# Patient Record
Sex: Female | Born: 1935 | Race: White | Hispanic: No | Smoking: Never smoker
Health system: Southern US, Community
[De-identification: ages and names within clinical notes are randomized; demographics above are authoritative.]

## PROBLEM LIST (undated history)

## (undated) DIAGNOSIS — K219 Gastro-esophageal reflux disease without esophagitis: Secondary | ICD-10-CM

## (undated) DIAGNOSIS — I509 Heart failure, unspecified: Secondary | ICD-10-CM

## (undated) DIAGNOSIS — I493 Ventricular premature depolarization: Secondary | ICD-10-CM

## (undated) DIAGNOSIS — S2239XA Fracture of one rib, unspecified side, initial encounter for closed fracture: Secondary | ICD-10-CM

## (undated) DIAGNOSIS — F0390 Unspecified dementia without behavioral disturbance: Secondary | ICD-10-CM

## (undated) DIAGNOSIS — E785 Hyperlipidemia, unspecified: Secondary | ICD-10-CM

## (undated) DIAGNOSIS — E039 Hypothyroidism, unspecified: Secondary | ICD-10-CM

## (undated) DIAGNOSIS — D126 Benign neoplasm of colon, unspecified: Secondary | ICD-10-CM

## (undated) DIAGNOSIS — K222 Esophageal obstruction: Secondary | ICD-10-CM

## (undated) DIAGNOSIS — R51 Headache: Secondary | ICD-10-CM

## (undated) DIAGNOSIS — IMO0002 Reserved for concepts with insufficient information to code with codable children: Secondary | ICD-10-CM

## (undated) DIAGNOSIS — S2249XA Multiple fractures of ribs, unspecified side, initial encounter for closed fracture: Secondary | ICD-10-CM

## (undated) DIAGNOSIS — I471 Supraventricular tachycardia, unspecified: Secondary | ICD-10-CM

## (undated) DIAGNOSIS — I10 Essential (primary) hypertension: Secondary | ICD-10-CM

## (undated) HISTORY — DX: Essential (primary) hypertension: I10

## (undated) HISTORY — PX: BREAST ENHANCEMENT SURGERY: SHX7

## (undated) HISTORY — DX: Reserved for concepts with insufficient information to code with codable children: IMO0002

## (undated) HISTORY — PX: COLONOSCOPY: SHX174

## (undated) HISTORY — DX: Benign neoplasm of colon, unspecified: D12.6

## (undated) HISTORY — DX: Gastro-esophageal reflux disease without esophagitis: K21.9

## (undated) HISTORY — DX: Fracture of one rib, unspecified side, initial encounter for closed fracture: S22.39XA

## (undated) HISTORY — DX: Ventricular premature depolarization: I49.3

## (undated) HISTORY — PX: ABDOMINAL HYSTERECTOMY: SHX81

## (undated) HISTORY — PX: CARDIAC ELECTROPHYSIOLOGY MAPPING AND ABLATION: SHX1292

## (undated) HISTORY — DX: Hyperlipidemia, unspecified: E78.5

## (undated) HISTORY — PX: CATARACT EXTRACTION: SUR2

## (undated) HISTORY — DX: Multiple fractures of ribs, unspecified side, initial encounter for closed fracture: S22.49XA

## (undated) HISTORY — PX: OTHER SURGICAL HISTORY: SHX169

## (undated) HISTORY — DX: Esophageal obstruction: K22.2

---

## 1999-07-29 ENCOUNTER — Encounter: Payer: Self-pay | Admitting: Internal Medicine

## 1999-07-29 ENCOUNTER — Ambulatory Visit (HOSPITAL_COMMUNITY): Admission: RE | Admit: 1999-07-29 | Discharge: 1999-07-29 | Payer: Self-pay | Admitting: Internal Medicine

## 2000-09-27 ENCOUNTER — Encounter: Payer: Self-pay | Admitting: Emergency Medicine

## 2000-09-27 ENCOUNTER — Emergency Department (HOSPITAL_COMMUNITY): Admission: EM | Admit: 2000-09-27 | Discharge: 2000-09-27 | Payer: Self-pay | Admitting: Emergency Medicine

## 2000-12-09 ENCOUNTER — Other Ambulatory Visit: Admission: RE | Admit: 2000-12-09 | Discharge: 2000-12-09 | Payer: Self-pay | Admitting: Obstetrics and Gynecology

## 2001-06-01 DIAGNOSIS — D126 Benign neoplasm of colon, unspecified: Secondary | ICD-10-CM

## 2001-06-01 HISTORY — DX: Benign neoplasm of colon, unspecified: D12.6

## 2002-07-24 ENCOUNTER — Encounter: Payer: Self-pay | Admitting: Family Medicine

## 2002-07-24 ENCOUNTER — Encounter: Admission: RE | Admit: 2002-07-24 | Discharge: 2002-07-24 | Payer: Self-pay | Admitting: Family Medicine

## 2003-02-19 ENCOUNTER — Encounter: Admission: RE | Admit: 2003-02-19 | Discharge: 2003-02-19 | Payer: Self-pay | Admitting: Internal Medicine

## 2003-02-19 ENCOUNTER — Encounter: Payer: Self-pay | Admitting: Internal Medicine

## 2003-08-24 ENCOUNTER — Emergency Department (HOSPITAL_COMMUNITY): Admission: EM | Admit: 2003-08-24 | Discharge: 2003-08-25 | Payer: Self-pay | Admitting: Emergency Medicine

## 2003-11-21 ENCOUNTER — Inpatient Hospital Stay (HOSPITAL_COMMUNITY): Admission: EM | Admit: 2003-11-21 | Discharge: 2003-11-22 | Payer: Self-pay | Admitting: Emergency Medicine

## 2003-11-22 ENCOUNTER — Encounter: Payer: Self-pay | Admitting: Internal Medicine

## 2004-04-22 ENCOUNTER — Ambulatory Visit: Payer: Self-pay | Admitting: Gastroenterology

## 2004-06-01 DIAGNOSIS — I493 Ventricular premature depolarization: Secondary | ICD-10-CM

## 2004-06-01 HISTORY — DX: Ventricular premature depolarization: I49.3

## 2004-08-04 ENCOUNTER — Ambulatory Visit: Payer: Self-pay | Admitting: Internal Medicine

## 2004-08-05 ENCOUNTER — Ambulatory Visit: Payer: Self-pay | Admitting: Internal Medicine

## 2004-08-12 ENCOUNTER — Ambulatory Visit: Payer: Self-pay

## 2004-09-04 ENCOUNTER — Ambulatory Visit: Payer: Self-pay | Admitting: Internal Medicine

## 2004-09-23 ENCOUNTER — Encounter: Admission: RE | Admit: 2004-09-23 | Discharge: 2004-09-23 | Payer: Self-pay | Admitting: Internal Medicine

## 2004-11-05 ENCOUNTER — Ambulatory Visit: Payer: Self-pay | Admitting: Internal Medicine

## 2004-11-11 ENCOUNTER — Ambulatory Visit (HOSPITAL_COMMUNITY): Admission: RE | Admit: 2004-11-11 | Discharge: 2004-11-11 | Payer: Self-pay | Admitting: Orthopedic Surgery

## 2004-11-18 ENCOUNTER — Encounter: Admission: RE | Admit: 2004-11-18 | Discharge: 2004-12-17 | Payer: Self-pay | Admitting: Internal Medicine

## 2004-12-10 ENCOUNTER — Ambulatory Visit: Payer: Self-pay | Admitting: Internal Medicine

## 2005-01-05 ENCOUNTER — Encounter: Admission: RE | Admit: 2005-01-05 | Discharge: 2005-02-03 | Payer: Self-pay | Admitting: Orthopedic Surgery

## 2005-03-23 ENCOUNTER — Ambulatory Visit: Payer: Self-pay | Admitting: Internal Medicine

## 2005-03-26 ENCOUNTER — Ambulatory Visit: Payer: Self-pay | Admitting: Internal Medicine

## 2005-04-20 ENCOUNTER — Ambulatory Visit: Payer: Self-pay | Admitting: Internal Medicine

## 2005-06-01 DIAGNOSIS — K222 Esophageal obstruction: Secondary | ICD-10-CM

## 2005-06-01 HISTORY — DX: Esophageal obstruction: K22.2

## 2005-06-23 ENCOUNTER — Ambulatory Visit: Payer: Self-pay | Admitting: Gastroenterology

## 2005-07-08 ENCOUNTER — Encounter: Payer: Self-pay | Admitting: Internal Medicine

## 2005-07-08 ENCOUNTER — Ambulatory Visit: Payer: Self-pay | Admitting: Gastroenterology

## 2005-08-20 ENCOUNTER — Ambulatory Visit: Payer: Self-pay | Admitting: Internal Medicine

## 2005-08-27 ENCOUNTER — Ambulatory Visit: Payer: Self-pay | Admitting: Internal Medicine

## 2005-09-01 ENCOUNTER — Ambulatory Visit: Payer: Self-pay | Admitting: Gastroenterology

## 2005-11-06 ENCOUNTER — Ambulatory Visit: Payer: Self-pay | Admitting: Internal Medicine

## 2005-11-17 ENCOUNTER — Ambulatory Visit: Payer: Self-pay | Admitting: Gastroenterology

## 2005-11-25 ENCOUNTER — Ambulatory Visit: Payer: Self-pay | Admitting: Gastroenterology

## 2005-11-27 ENCOUNTER — Encounter: Payer: Self-pay | Admitting: Internal Medicine

## 2005-11-27 ENCOUNTER — Ambulatory Visit: Payer: Self-pay | Admitting: Gastroenterology

## 2005-12-16 ENCOUNTER — Ambulatory Visit: Payer: Self-pay | Admitting: Internal Medicine

## 2005-12-18 ENCOUNTER — Ambulatory Visit: Payer: Self-pay | Admitting: Gastroenterology

## 2005-12-21 ENCOUNTER — Ambulatory Visit: Payer: Self-pay

## 2006-01-12 ENCOUNTER — Ambulatory Visit: Payer: Self-pay | Admitting: Internal Medicine

## 2006-07-20 ENCOUNTER — Ambulatory Visit: Payer: Self-pay | Admitting: Internal Medicine

## 2006-08-18 ENCOUNTER — Ambulatory Visit: Payer: Self-pay

## 2007-02-24 ENCOUNTER — Encounter: Payer: Self-pay | Admitting: Internal Medicine

## 2007-04-05 ENCOUNTER — Telehealth (INDEPENDENT_AMBULATORY_CARE_PROVIDER_SITE_OTHER): Payer: Self-pay | Admitting: *Deleted

## 2007-07-18 ENCOUNTER — Ambulatory Visit: Payer: Self-pay | Admitting: Internal Medicine

## 2007-07-18 DIAGNOSIS — K209 Esophagitis, unspecified without bleeding: Secondary | ICD-10-CM | POA: Insufficient documentation

## 2007-07-18 DIAGNOSIS — M5020 Other cervical disc displacement, unspecified cervical region: Secondary | ICD-10-CM | POA: Insufficient documentation

## 2007-07-18 DIAGNOSIS — E039 Hypothyroidism, unspecified: Secondary | ICD-10-CM

## 2007-07-18 DIAGNOSIS — E782 Mixed hyperlipidemia: Secondary | ICD-10-CM

## 2007-07-19 ENCOUNTER — Encounter (INDEPENDENT_AMBULATORY_CARE_PROVIDER_SITE_OTHER): Payer: Self-pay | Admitting: *Deleted

## 2007-07-26 ENCOUNTER — Encounter (INDEPENDENT_AMBULATORY_CARE_PROVIDER_SITE_OTHER): Payer: Self-pay | Admitting: *Deleted

## 2007-07-28 ENCOUNTER — Encounter (INDEPENDENT_AMBULATORY_CARE_PROVIDER_SITE_OTHER): Payer: Self-pay | Admitting: *Deleted

## 2007-07-28 ENCOUNTER — Ambulatory Visit: Payer: Self-pay | Admitting: Internal Medicine

## 2007-09-30 ENCOUNTER — Telehealth (INDEPENDENT_AMBULATORY_CARE_PROVIDER_SITE_OTHER): Payer: Self-pay | Admitting: *Deleted

## 2007-09-30 ENCOUNTER — Observation Stay (HOSPITAL_COMMUNITY): Admission: EM | Admit: 2007-09-30 | Discharge: 2007-10-03 | Payer: Self-pay | Admitting: Emergency Medicine

## 2007-09-30 ENCOUNTER — Ambulatory Visit: Payer: Self-pay | Admitting: Cardiology

## 2007-10-19 ENCOUNTER — Ambulatory Visit: Payer: Self-pay | Admitting: Cardiovascular Disease

## 2007-10-31 ENCOUNTER — Ambulatory Visit: Payer: Self-pay | Admitting: Internal Medicine

## 2007-10-31 DIAGNOSIS — IMO0002 Reserved for concepts with insufficient information to code with codable children: Secondary | ICD-10-CM | POA: Insufficient documentation

## 2007-10-31 DIAGNOSIS — M79609 Pain in unspecified limb: Secondary | ICD-10-CM

## 2008-01-12 ENCOUNTER — Ambulatory Visit: Payer: Self-pay | Admitting: Internal Medicine

## 2008-01-12 ENCOUNTER — Encounter (INDEPENDENT_AMBULATORY_CARE_PROVIDER_SITE_OTHER): Payer: Self-pay | Admitting: *Deleted

## 2008-01-12 DIAGNOSIS — R42 Dizziness and giddiness: Secondary | ICD-10-CM

## 2008-01-12 DIAGNOSIS — I10 Essential (primary) hypertension: Secondary | ICD-10-CM

## 2008-01-12 DIAGNOSIS — I4949 Other premature depolarization: Secondary | ICD-10-CM

## 2008-01-13 ENCOUNTER — Encounter (INDEPENDENT_AMBULATORY_CARE_PROVIDER_SITE_OTHER): Payer: Self-pay | Admitting: *Deleted

## 2008-02-01 ENCOUNTER — Ambulatory Visit: Payer: Self-pay | Admitting: Cardiology

## 2008-03-05 ENCOUNTER — Ambulatory Visit: Payer: Self-pay | Admitting: Internal Medicine

## 2008-03-05 DIAGNOSIS — M545 Low back pain: Secondary | ICD-10-CM

## 2008-03-05 LAB — CONVERTED CEMR LAB
Bilirubin Urine: NEGATIVE
Glucose, Urine, Semiquant: NEGATIVE
pH: 6.5

## 2008-03-09 ENCOUNTER — Telehealth (INDEPENDENT_AMBULATORY_CARE_PROVIDER_SITE_OTHER): Payer: Self-pay | Admitting: *Deleted

## 2008-03-12 ENCOUNTER — Ambulatory Visit: Payer: Self-pay | Admitting: Internal Medicine

## 2008-03-19 ENCOUNTER — Ambulatory Visit: Payer: Self-pay | Admitting: Internal Medicine

## 2008-03-19 LAB — CONVERTED CEMR LAB
Glucose, Urine, Semiquant: NEGATIVE
Specific Gravity, Urine: <1.005
pH: 7

## 2008-03-20 ENCOUNTER — Encounter: Payer: Self-pay | Admitting: Internal Medicine

## 2008-03-21 ENCOUNTER — Telehealth (INDEPENDENT_AMBULATORY_CARE_PROVIDER_SITE_OTHER): Payer: Self-pay | Admitting: *Deleted

## 2008-03-23 ENCOUNTER — Telehealth (INDEPENDENT_AMBULATORY_CARE_PROVIDER_SITE_OTHER): Payer: Self-pay | Admitting: *Deleted

## 2008-04-16 ENCOUNTER — Encounter: Payer: Self-pay | Admitting: Internal Medicine

## 2008-04-18 ENCOUNTER — Encounter: Payer: Self-pay | Admitting: Internal Medicine

## 2008-06-26 ENCOUNTER — Encounter: Payer: Self-pay | Admitting: Internal Medicine

## 2008-06-27 ENCOUNTER — Telehealth (INDEPENDENT_AMBULATORY_CARE_PROVIDER_SITE_OTHER): Payer: Self-pay | Admitting: *Deleted

## 2008-07-18 ENCOUNTER — Ambulatory Visit: Payer: Self-pay | Admitting: Internal Medicine

## 2008-09-18 ENCOUNTER — Encounter: Payer: Self-pay | Admitting: Internal Medicine

## 2008-09-19 ENCOUNTER — Telehealth: Payer: Self-pay | Admitting: Internal Medicine

## 2008-09-20 ENCOUNTER — Ambulatory Visit: Payer: Self-pay | Admitting: Internal Medicine

## 2008-09-20 DIAGNOSIS — D487 Neoplasm of uncertain behavior of other specified sites: Secondary | ICD-10-CM

## 2008-09-21 ENCOUNTER — Encounter (INDEPENDENT_AMBULATORY_CARE_PROVIDER_SITE_OTHER): Payer: Self-pay | Admitting: *Deleted

## 2008-09-21 LAB — CONVERTED CEMR LAB
Basophils Absolute: 0 10*3/uL (ref 0.0–0.1)
Lymphocytes Relative: 36.8 % (ref 12.0–46.0)
Lymphs Abs: 2.8 10*3/uL (ref 0.7–4.0)
Monocytes Relative: 7.7 % (ref 3.0–12.0)
Platelets: 279 10*3/uL (ref 150.0–400.0)
Prothrombin Time: 10.6 s — ABNORMAL LOW (ref 10.9–13.3)
RDW: 12.5 % (ref 11.5–14.6)
aPTT: 27.1 s (ref 21.7–28.8)

## 2008-10-01 ENCOUNTER — Telehealth (INDEPENDENT_AMBULATORY_CARE_PROVIDER_SITE_OTHER): Payer: Self-pay | Admitting: *Deleted

## 2008-10-05 ENCOUNTER — Encounter: Payer: Self-pay | Admitting: Internal Medicine

## 2008-10-09 ENCOUNTER — Telehealth: Payer: Self-pay | Admitting: Internal Medicine

## 2008-10-22 ENCOUNTER — Ambulatory Visit: Payer: Self-pay | Admitting: Internal Medicine

## 2008-10-22 DIAGNOSIS — R002 Palpitations: Secondary | ICD-10-CM | POA: Insufficient documentation

## 2008-10-30 ENCOUNTER — Ambulatory Visit: Payer: Self-pay | Admitting: Internal Medicine

## 2008-11-04 LAB — CONVERTED CEMR LAB
ALT: 20 units/L (ref 0–35)
AST: 25 units/L (ref 0–37)
Alkaline Phosphatase: 56 units/L (ref 39–117)
Bilirubin, Direct: 0 mg/dL (ref 0.0–0.3)
Calcium: 9.6 mg/dL (ref 8.4–10.5)
GFR calc non Af Amer: 73.01 mL/min (ref >60)
Glucose, Bld: 86 mg/dL (ref 70–99)
HDL: 57.8 mg/dL (ref >39.00)
Sodium: 144 meq/L (ref 135–145)
TSH: 5.89 microintl units/mL — ABNORMAL HIGH (ref 0.35–5.50)
Total Bilirubin: 0.9 mg/dL (ref 0.3–1.2)

## 2008-11-05 ENCOUNTER — Encounter (INDEPENDENT_AMBULATORY_CARE_PROVIDER_SITE_OTHER): Payer: Self-pay | Admitting: *Deleted

## 2008-11-05 ENCOUNTER — Telehealth (INDEPENDENT_AMBULATORY_CARE_PROVIDER_SITE_OTHER): Payer: Self-pay | Admitting: *Deleted

## 2008-12-24 ENCOUNTER — Telehealth (INDEPENDENT_AMBULATORY_CARE_PROVIDER_SITE_OTHER): Payer: Self-pay | Admitting: *Deleted

## 2009-01-09 ENCOUNTER — Encounter: Payer: Self-pay | Admitting: Internal Medicine

## 2009-01-18 ENCOUNTER — Telehealth (INDEPENDENT_AMBULATORY_CARE_PROVIDER_SITE_OTHER): Payer: Self-pay | Admitting: *Deleted

## 2009-02-05 ENCOUNTER — Ambulatory Visit: Payer: Self-pay | Admitting: Internal Medicine

## 2009-02-06 ENCOUNTER — Telehealth (INDEPENDENT_AMBULATORY_CARE_PROVIDER_SITE_OTHER): Payer: Self-pay | Admitting: *Deleted

## 2009-02-07 ENCOUNTER — Encounter (INDEPENDENT_AMBULATORY_CARE_PROVIDER_SITE_OTHER): Payer: Self-pay | Admitting: *Deleted

## 2009-02-08 ENCOUNTER — Ambulatory Visit: Payer: Self-pay | Admitting: Internal Medicine

## 2009-02-08 DIAGNOSIS — M949 Disorder of cartilage, unspecified: Secondary | ICD-10-CM

## 2009-02-08 DIAGNOSIS — M899 Disorder of bone, unspecified: Secondary | ICD-10-CM | POA: Insufficient documentation

## 2009-02-20 ENCOUNTER — Ambulatory Visit: Payer: Self-pay | Admitting: Internal Medicine

## 2009-06-05 ENCOUNTER — Encounter: Payer: Self-pay | Admitting: Internal Medicine

## 2009-07-09 ENCOUNTER — Ambulatory Visit: Payer: Self-pay | Admitting: Internal Medicine

## 2009-07-09 DIAGNOSIS — M5137 Other intervertebral disc degeneration, lumbosacral region: Secondary | ICD-10-CM

## 2009-07-09 DIAGNOSIS — R040 Epistaxis: Secondary | ICD-10-CM

## 2009-07-09 LAB — CONVERTED CEMR LAB
Bilirubin Urine: NEGATIVE
Blood in Urine, dipstick: NEGATIVE
Ketones, urine, test strip: NEGATIVE
Protein, U semiquant: NEGATIVE
Urobilinogen, UA: 0.2

## 2009-10-14 ENCOUNTER — Telehealth: Payer: Self-pay | Admitting: Gastroenterology

## 2009-11-07 ENCOUNTER — Telehealth (INDEPENDENT_AMBULATORY_CARE_PROVIDER_SITE_OTHER): Payer: Self-pay | Admitting: *Deleted

## 2009-11-08 ENCOUNTER — Ambulatory Visit: Payer: Self-pay | Admitting: Internal Medicine

## 2010-01-20 ENCOUNTER — Encounter: Payer: Self-pay | Admitting: Internal Medicine

## 2010-02-05 ENCOUNTER — Ambulatory Visit: Payer: Self-pay | Admitting: Internal Medicine

## 2010-06-10 ENCOUNTER — Telehealth (INDEPENDENT_AMBULATORY_CARE_PROVIDER_SITE_OTHER): Payer: Self-pay | Admitting: *Deleted

## 2010-06-29 LAB — CONVERTED CEMR LAB
AST: 22 units/L (ref 0–37)
BUN: 14 mg/dL (ref 6–23)
Basophils Relative: 0.8 % (ref 0.0–1.0)
Bilirubin, Direct: 0.1 mg/dL (ref 0.0–0.3)
Calcium: 10 mg/dL (ref 8.4–10.5)
GFR calc non Af Amer: 73 mL/min
Glucose, Bld: 93 mg/dL (ref 70–99)
HDL: 62.9 mg/dL (ref >39.0)
LDL Cholesterol: 109 mg/dL — ABNORMAL HIGH (ref 0–99)
Lymphocytes Relative: 39.6 % (ref 12.0–46.0)
Monocytes Relative: 7.3 % (ref 3.0–11.0)
Neutro Abs: 2.8 10*3/uL (ref 1.4–7.7)
Platelets: 267 10*3/uL (ref 150–400)
Potassium: 4.2 meq/L (ref 3.5–5.1)
RDW: 12 % (ref 11.5–14.6)
Sodium: 141 meq/L (ref 135–145)
TSH: 2.21 microintl units/mL (ref 0.35–5.50)
Total Bilirubin: 0.8 mg/dL (ref 0.3–1.2)
Triglycerides: 47 mg/dL (ref 0–149)
VLDL: 9 mg/dL (ref 0–40)
Vit D, 1,25-Dihydroxy: 53 (ref 30–89)

## 2010-07-03 NOTE — Progress Notes (Signed)
Summary: Refill Request  Phone Note Refill Request Message from:  Patient on November 07, 2009 12:42 PM  Refills Requested: Medication #1:  SYNTHROID 75 MCG  TABS TAKE ONE HALF TABLET DAILY EXCEPT 1 pill q Weds   Dosage confirmed as above?Dosage Confirmed   Brand Name Necessary? No   Supply Requested: 3 months  Medication #2:  HYDROCHLOROTHIAZIDE 25 MG TABS Take 1/2 tab once daily   Dosage confirmed as above?Dosage Confirmed   Supply Requested: 3 months [facility]  Next Appointment Scheduled: none Initial call taken by: Harold Barban,  November 07, 2009 12:43 PM  Follow-up for Phone Call        Spoke with patient and informed her that labs is due to refill synthroid. Patient scheduled appointment for tomorrow and said hold off on refilling both meds until lab results back. Follow-up by: Shonna Chock,  November 07, 2009 2:34 PM    Prescriptions: HYDROCHLOROTHIAZIDE 25 MG TABS (HYDROCHLOROTHIAZIDE) Take 1/2 tab once daily  #100 Tablet x 3   Entered by:   Shonna Chock   Authorized by:   Marga Melnick MD   Signed by:   Shonna Chock on 11/11/2009   Method used:   Faxed to ...       [facility] (mail-order)             , [place]         Ph: [phone]       Fax: [phone]   RxID:   [id number] SYNTHROID 75 MCG  TABS (LEVOTHYROXINE SODIUM) TAKE ONE HALF TABLET DAILY EXCEPT 1 pill q Weds  #60 x 3   Entered by:   Shonna Chock   Authorized by:   Marga Melnick MD   Signed by:   Shonna Chock on 11/11/2009   Method used:   Faxed to ...       [facility] (mail-order)             , [place]         Ph: [phone]       Fax: [phone]   RxID:   [id number]

## 2010-07-03 NOTE — Letter (Signed)
Summary: Jacqualyn Posey DC MPH  Jacqualyn Posey DC MPH   Imported By: Lanelle Bal 06/28/2009 12:25:54  _____________________________________________________________________  External Attachment:    Type:   Image     Comment:   External Document

## 2010-07-03 NOTE — Assessment & Plan Note (Signed)
Summary: DON'T KNOW//PH   Vital Signs:  Patient profile:   77 year old female Weight:      137.8 pounds Temp:     98.5 degrees F oral Pulse rate:   60 / minute Resp:     15 per minute BP sitting:   134 / 88  (left arm) Cuff size:   regular  Vitals Entered By: Shonna Chock (July 09, 2009 11:38 AM) CC: 1.) Sore in nose  2.) Right side back paoin-lower Comments REVIEWED MED LIST, PATIENT AGREED DOSE AND INSTRUCTION CORRECT    Primary Care Provider:  Marga Melnick MD  CC:  1.) Sore in nose  2.) Right side back paoin-lower.  History of Present Illness: She has had chronic LBP since [place]  [place] train [place]. Dr Melvyn Neth , her Chiropractor questioned L5- S1 traumatic disc protrusion . His 06/05/2009 note reviewed. He suggested MRI , but she now remembers she had MRI by Dr Lestine Box post fall in early November. She is unsure as to structure imaged.Subsequently she saw Dr Ranell Patrick. Rx: Physical Therapy. Chiropractry has helped back pain.              Also she has had a sore in nose intermittently for years , especially in Winter . Epistaxis fro R 07/07/2009. Rx: [place] topically  Allergies: 1)  ! Prevacid  Review of Systems General:  Denies chills, fever, sweats, and weight loss. ENT:  Complains of sinus pressure; denies nasal congestion; No frontal headache, facial pain or purulence, but clear rhinitis. Resp:  Denies cough and sputum productive. GI:  No stool incontinence. GU:  Denies incontinence; Urgency . Derm:  Denies lesion(s) and rash. Neuro:  Complains of numbness; denies brief paralysis, tingling, and weakness; Numbness in feet occasionally. No radicular pain in LE.  Physical Exam  General:  Appears younger than age,well-nourished,in no acute distress; alert,appropriate and cooperative throughout examination Ears:  External ear exam shows no significant lesions or deformities.  Otoscopic examination reveals clear canals, tympanic membranes are intact bilaterally without  bulging, retraction, inflammation or discharge. Hearing is grossly normal bilaterally. Nose:  External nasal examination shows no deformity or inflammation. R nasal mucosa dry & erythematous  without lesions or exudates. Mouth:  Oral mucosa and oropharynx without lesions or exudates.  Teeth in good repair. Benign osteomata of mandible Lungs:  Normal respiratory effort, chest expands symmetrically. Lungs are clear to auscultation, no crackles or wheezes. Msk:  Sat up w/o help Extremities:  Negative SLR bilaterally Neurologic:  alert & oriented X3, strength normal in all extremities, heel & tiptoe gait normal, and DTRs symmetrical and normal.   Cervical Nodes:  No lymphadenopathy noted Axillary Nodes:  No palpable lymphadenopathy Psych:  memory intact for recent and remote, normally interactive, and good eye contact.     Impression & Recommendations:  Problem # 1:  DEGENERATIVE DISC DISEASE, LUMBAR SPINE (ICD-722.52)  Clinically no Neuro deficit; ? MRI done 04/2008 by Dr Lestine Box  Orders: Prescription Created Electronically [phone])  Problem # 2:  EPISTAXIS (ICD-784.7) due to excessive drying  Complete Medication List: 1)  Synthroid 75 Mcg Tabs (Levothyroxine sodium) .... Take one half tablet daily except 1 pill q weds 2)  Lipitor 20 Mg Tabs (Atorvastatin calcium) .Marland Kitchen.. 1 by mouth every other day 3)  Adult Aspirin Low Strength 81 Mg Tbdp (Aspirin) .... 1/2 tablet  by mouth once daily 4)  Glucosamine 1500 Complex Caps (Glucosamine-chondroit-vit c-mn) .Marland Kitchen.. 1 by mouth once daily 5)  Calcium 600  Mg Tabs (calcium Carbonate)  .... Take one tablet by mouth twice daily. 6)  Multi-day Vitamins Tabs (Multiple vitamin) .Marland Kitchen.. 1 by mouth once daily 7)  Vitamin D3 2000 Unit Caps (Cholecalciferol) .Marland Kitchen.. 1 by mouth once daily 8)  Vitamin C 1000 Mg Tabs (Ascorbic acid) .Marland Kitchen.. 1 by mouth once daily 9)  Tylenol Ex St Arthritis Pain 500 Mg Tabs (Acetaminophen) .... As needed 10)  Spironolactone 25 Mg Tabs  (Spironolactone) .Marland Kitchen.. 1 once daily if bp averages > 135/85 11)  Aciphex 20 Mg Tbec (Rabeprazole sodium) .Marland Kitchen.. 1 tablet by mouth twice a day 12)  Hydrochlorothiazide 25 Mg Tabs (Hydrochlorothiazide) .... Take 1/2 tab once daily 13)  Vitamin E 400 Unit Caps (Vitamin e) .Marland Kitchen.. 1 by mouth once daily 14)  Ocuvite Preservision Tabs (Multiple vitamins-minerals) .... Take one tablet by mouth once daily. 15)  Fish Oil Oil (Fish oil) .Marland Kitchen.. 1 tablet 3-7 times per week 16)  Coq10 100 Mg Caps (Coenzyme q10) .Marland Kitchen.. 1 by mouth once daily 17)  Tramadol Hcl 50 Mg Tabs (Tramadol hcl) .Marland Kitchen.. 1 q 6 hrs as needed joint pain  Other Orders: UA Dipstick w/o Micro (manual) ([id number])  Patient Instructions: 1)  Eucerin two times a day to nasal tissue, do not use at bedtime . Sign Release for Dr Ashok Norris records Prescriptions: TRAMADOL HCL 50 MG TABS (TRAMADOL HCL) 1 q 6 hrs as needed joint pain  #30 x 2   Entered and Authorized by:   Marga Melnick MD   Signed by:   Marga Melnick MD on 07/09/2009   Method used:   Faxed to ...       [facility] [phone]* (retail)       [address] [place], [place]  [address]       Ph: [phone]       Fax: [phone]   RxID:   [id number]   Laboratory Results   Urine Tests    Routine Urinalysis   Color: yellow Appearance: Clear Glucose: negative   (Normal Range: Negative) Bilirubin: negative   (Normal Range: Negative) Ketone: negative   (Normal Range: Negative) Spec. Gravity: <1.005   (Normal Range: 1.003-1.035) Blood: negative   (Normal Range: Negative) pH: 7.5   (Normal Range: 5.0-8.0) Protein: negative   (Normal Range: Negative) Urobilinogen: 0.2   (Normal Range: 0-1) Nitrite: negative   (Normal Range: Negative) Leukocyte Esterace: negative   (Normal Range: Negative)

## 2010-07-03 NOTE — Assessment & Plan Note (Signed)
Summary: yearly/sl   Primary [place]ne Banta:  Shelly Melnick MD  CC:  yearly.  Marland Kitchen  History of Present Illness: Shelly Ross returns today for followup of palpitations.  She is an elderly woman with a h/o palpitations of unclear etiology.  When I saw her last I had asked her to come in and get an ECG if she had more symptoms.  She had been taking fosamax and once she stopped this medication, the palpitations have resolved. Her main complaint today is regarding arthritis in her knees.  No c/p or sob. No syncope.  Current Medications (verified): 1)  Synthroid 75 Mcg  Tabs (Levothyroxine Sodium) .... Take One Half Tablet Daily Except 1 Pill Q Weds 2)  Lipitor 20 Mg  Tabs (Atorvastatin Calcium) .Marland Kitchen.. 1 By Mouth Every Other Day 3)  Adult Aspirin Low Strength 81 Mg  Tbdp (Aspirin) .... 1/2 Tablet  By Mouth Once Daily 4)  Glucosamine 1500 Complex   Caps (Glucosamine-Chondroit-Vit C-Mn) .Marland Kitchen.. 1 By Mouth Once Daily 5)  Calcium 600 Mg  Tabs (Calcium Carbonate) .... Take One Tablet By Mouth Twice Daily. 6)  Multi-Day Vitamins   Tabs (Multiple Vitamin) .Marland Kitchen.. 1 By Mouth Once Daily 7)  Vitamin D3 2000 Unit Caps (Cholecalciferol) .Marland Kitchen.. 1 By Mouth Two Times A Day 8)  Vitamin C 1000 Mg  Tabs (Ascorbic Acid) .Marland Kitchen.. 1 By Mouth Once Daily 9)  Tylenol Ex St Arthritis Pain 500 Mg  Tabs (Acetaminophen) .... As Needed 10)  Spironolactone 25 Mg Tabs (Spironolactone) .Marland Kitchen.. 1 Once Daily If Bp Averages > 135/85 11)  Aciphex 20 Mg Tbec (Rabeprazole Sodium) .Marland Kitchen.. 1 Tablet By Mouth Twice A Day 12)  Hydrochlorothiazide 25 Mg Tabs (Hydrochlorothiazide) .... Take 1/2 Tab Once Daily 13)  Vitamin E 400 Unit Caps (Vitamin E) .Marland Kitchen.. 1 By Mouth Once Daily 14)  Ocuvite Preservision  Tabs (Multiple Vitamins-Minerals) .... Take One Tablet By Mouth Once Daily. 15)  Fish Oil   Oil (Fish Oil) .Marland Kitchen.. 1 Tablet 3-7 Times Per Week 16)  Coq10 100 Mg Caps (Coenzyme Q10) .Marland Kitchen.. 1 By Mouth Once Daily 17)  Tramadol Hcl 50 Mg Tabs (Tramadol Hcl) .Marland Kitchen.. 1 Q 6 Hrs As  Needed Joint Pain  Allergies (verified): 1)  ! Prevacid  Past History:  Past Medical History: Last updated: 10/19/2008 UNSPECIFIED PRE-OPERATIVE EXAMINATION (ICD-V72.84) NEOPLASM UNCERTAIN BEHAVIOR OTHER SPEC SITES (ICD-238.8) LOW BACK PAIN, CHRONIC (ICD-724.2) PREMATURE VENTRICULAR CONTRACTIONS (ICD-427.69) DIZZINESS (ICD-780.4) HYPERTENSION (ICD-401.9) UNSPECIFIED NEURALGIA NEURITIS AND RADICULITIS (ICD-729.2) LEG PAIN, LEFT (ICD-729.5) DEGENERATIVE DISC DISEASE, CERVICAL SPINE, W/RADICULOPATHY (ICD-722.0) GERD (ICD-530.1) HYPERLIPIDEMIA (ICD-272.2) HYPOTHYROIDISM (ICD-244.9) OSTEOPOROSIS (ICD-733.00)  Past Surgical History: Last updated: 09/20/2008 Hysterectomy, endometriosis with Oophorectomy, ? bilat mammoplasty gravid 2 para 2 tacking procedure colonoscopy with polyp 12/2001 ablation  for PAF 09/2003 colonoscopy proctitis, tics 03/2004 endo stricture, hiatal hernia 07/2005 colonoscopy tics 11/27/2005 Cath  5/09 : no critical lesions   Review of Systems  The patient denies chest pain, syncope, dyspnea on exertion, and peripheral edema.    Vital Signs:  Patient profile:   77 year old female Height:      65 inches Weight:      132 pounds BMI:     22.05 Pulse rate:   63 / minute Pulse rhythm:   regular BP sitting:   116 / 72  (right arm) Cuff size:   regular  Vitals Entered By: Judithe Modest CMA (February 05, 2010 11:40 AM)  Physical Exam  General:  Appears younger than age,well-nourished,in no acute distress; alert,appropriate and cooperative  throughout examination Head:  normocephalic and atraumatic Eyes:  No corneal or conjunctival inflammation noted. EOMI. Perrla.  FO Vision grossly normal. Mouth:  Oral mucosa and oropharynx without lesions or exudates.  Teeth in good repair. Benign osteomata of mandible Neck:  Neck supple, no JVD. No masses, thyromegaly or abnormal cervical nodes. Lungs:  Normal respiratory effort, chest expands symmetrically.  Lungs are clear to auscultation, no crackles or wheezes. Heart:  RRR with normal S1 and S2.  PMI is not enlarged or laterally displaced. Abdomen:  Bowel sounds positive; abdomen soft and non-tender without masses, organomegaly, or hernias noted. No hepatosplenomegaly. Msk:  Sat up w/o help Pulses:  pulses normal in all 4 extremities Extremities:  Negative SLR bilaterally Neurologic:  alert & oriented X3, strength normal in all extremities, heel & tiptoe gait normal, and DTRs symmetrical and normal.     EKG  Procedure date:  02/05/2010  Findings:      Normal sinus rhythm with rate of:  63.  Impression & Recommendations:  Problem # 1:  PALPITATIONS, RECURRENT (ICD-785.1) Her symptoms have resolved and high have recommended a period of watchful waiting.  She will call us if they return. Her updated medication list for this problem includes:    Adult Aspirin Low Strength 81 Mg Tbdp (Aspirin) .Marland Kitchen... 1/2 tablet  by mouth once daily  Orders: EKG w/ Interpretation (93000)  Problem # 2:  HYPERTENSION (ICD-401.9) Her blood pressure is well controlled.  She will continue meds as below and I will see her back on an as needed basis. Her updated medication list for this problem includes:    Adult Aspirin Low Strength 81 Mg Tbdp (Aspirin) .Marland Kitchen... 1/2 tablet  by mouth once daily    Spironolactone 25 Mg Tabs (Spironolactone) .Marland Kitchen... 1 once daily if bp averages > 135/85    Hydrochlorothiazide 25 Mg Tabs (Hydrochlorothiazide) .Marland Kitchen... Take 1/2 tab once daily  Patient Instructions: 1)  Your physician recommends that you schedule a follow-up appointment in: AS NEEDED 2)  Your physician recommends that you continue on your current medications as directed. Please refer to the Current Medication list given to you today.

## 2010-07-03 NOTE — Progress Notes (Signed)
Summary: Schedule NP3 to discuss colonosocpy  Phone Note Outgoing Call Call back at [facility] Phone [phone]   Call placed by: Harlow Mares CMA [facility]),  Oct 14, 2009 4:46 PM Call placed to: Patient Summary of Call: spoke to the patient she needs an office visit to discuss colonoscopy, she will call back after she thinks about having a colonoscopy. Initial call taken by: Harlow Mares CMA [facility]),  Oct 14, 2009 4:48 PM

## 2010-07-03 NOTE — Progress Notes (Signed)
Summary: Refill Request  Phone Note Refill Request Call back at [phone] Message from:  Patient on June 10, 2010 10:50 AM  Refills Requested: Medication #1:  LIPITOR 20 MG  TABS 1 by mouth every other day   Dosage confirmed as above?Dosage Confirmed   Supply Requested: 3 months   Last Refilled: 02/06/2009 [facility]  Next Appointment Scheduled: 1.23.12 Initial call taken by: Harold Barban,  June 10, 2010 10:51 AM  Follow-up for Phone Call        Can you please contact patient for fasting labs(here or [facility]), last chlosterol check was 2010, unless patient had elsewhere. Then I can give patient 30day supply at local pharmacy ( med is generic) until labs received.  Lipid/Hep 272.4/995.20 Follow-up by: Shonna Chock CMA,  June 10, 2010 11:30 AM  Additional Follow-up for Phone Call Additional follow up Details #1::        Patient is coming in 1.23.12 for a fasting appt.  Additional Follow-up by: Harold Barban,  June 10, 2010 11:39 AM    New/Updated Medications: LIPITOR 20 MG  TABS (ATORVASTATIN CALCIUM) 1 by mouth every other day Prescriptions: LIPITOR 20 MG  TABS (ATORVASTATIN CALCIUM) 1 by mouth every other day  #30 x 0   Entered by:   Shonna Chock CMA   Authorized by:   Marga Melnick MD   Signed by:   Shonna Chock CMA on 06/10/2010   Method used:   Electronically to        [id number]* (retail)       [address] [place], [place]  [address]       Ph: [phone]       Fax: [phone]   RxID:   [id number]

## 2010-07-23 ENCOUNTER — Encounter: Payer: Self-pay | Admitting: Internal Medicine

## 2010-07-23 ENCOUNTER — Encounter (INDEPENDENT_AMBULATORY_CARE_PROVIDER_SITE_OTHER): Payer: Medicare Other | Admitting: Internal Medicine

## 2010-07-23 DIAGNOSIS — E782 Mixed hyperlipidemia: Secondary | ICD-10-CM

## 2010-07-23 DIAGNOSIS — E039 Hypothyroidism, unspecified: Secondary | ICD-10-CM

## 2010-07-23 DIAGNOSIS — Z0000 Encounter for general adult medical examination without abnormal findings: Secondary | ICD-10-CM

## 2010-07-23 DIAGNOSIS — I10 Essential (primary) hypertension: Secondary | ICD-10-CM

## 2010-07-23 DIAGNOSIS — F411 Generalized anxiety disorder: Secondary | ICD-10-CM | POA: Insufficient documentation

## 2010-07-23 DIAGNOSIS — K209 Esophagitis, unspecified without bleeding: Secondary | ICD-10-CM

## 2010-07-24 ENCOUNTER — Other Ambulatory Visit: Payer: Medicare Other

## 2010-07-24 ENCOUNTER — Encounter (INDEPENDENT_AMBULATORY_CARE_PROVIDER_SITE_OTHER): Payer: Self-pay | Admitting: *Deleted

## 2010-07-24 ENCOUNTER — Other Ambulatory Visit: Payer: Self-pay | Admitting: Internal Medicine

## 2010-07-24 DIAGNOSIS — M949 Disorder of cartilage, unspecified: Secondary | ICD-10-CM

## 2010-07-24 DIAGNOSIS — T887XXA Unspecified adverse effect of drug or medicament, initial encounter: Secondary | ICD-10-CM

## 2010-07-24 DIAGNOSIS — E785 Hyperlipidemia, unspecified: Secondary | ICD-10-CM

## 2010-07-24 DIAGNOSIS — E039 Hypothyroidism, unspecified: Secondary | ICD-10-CM

## 2010-07-24 DIAGNOSIS — K219 Gastro-esophageal reflux disease without esophagitis: Secondary | ICD-10-CM

## 2010-07-24 DIAGNOSIS — M899 Disorder of bone, unspecified: Secondary | ICD-10-CM

## 2010-07-24 LAB — BASIC METABOLIC PANEL
BUN: 20 mg/dL (ref 6–23)
Calcium: 10 mg/dL (ref 8.4–10.5)
Creatinine, Ser: 0.8 mg/dL (ref 0.4–1.2)
GFR: 70.66 mL/min (ref >60.00)
Glucose, Bld: 77 mg/dL (ref 70–99)

## 2010-07-24 LAB — CBC WITH DIFFERENTIAL/PLATELET
Eosinophils Relative: 7.5 % — ABNORMAL HIGH (ref 0.0–5.0)
HCT: 42.3 % (ref 36.0–46.0)
Hemoglobin: 14.4 g/dL (ref 12.0–15.0)
Lymphs Abs: 2.5 10*3/uL (ref 0.7–4.0)
MCV: 93.1 fl (ref 78.0–100.0)
Monocytes Absolute: 0.5 10*3/uL (ref 0.1–1.0)
Neutro Abs: 2.5 10*3/uL (ref 1.4–7.7)
Platelets: 243 10*3/uL (ref 150.0–400.0)
RDW: 13.2 % (ref 11.5–14.6)

## 2010-07-24 LAB — LIPID PANEL
Cholesterol: 157 mg/dL (ref 0–200)
HDL: 58.9 mg/dL (ref >39.00)
VLDL: 5.8 mg/dL (ref 0.0–40.0)

## 2010-07-24 LAB — HEPATIC FUNCTION PANEL: Albumin: 3.7 g/dL (ref 3.5–5.2)

## 2010-07-29 NOTE — Assessment & Plan Note (Signed)
Summary: emp/lch   Vital Signs:  Patient profile:   77 year old female Height:      63.25 inches Weight:      134.6 pounds BMI:     23.74 Temp:     98.6 degrees F oral Pulse rate:   68 / minute Resp:     14 per minute BP sitting:   128 / 86  (left arm) Cuff size:   regular  Vitals Entered By: Shonna Chock CMA (July 23, 2010 11:02 AM) CC: 1.) CPX, patient forgot to fast  2.) Shakey and slight chest pain x 1 hour yesterday after eating   Vision Screening:Left eye with correction: 20 / 40 Right eye with correction: 20 / 40 Both eyes with correction: 20 / 40        Vision Entered By: Shonna Chock CMA (July 23, 2010 11:23 AM)   Primary Care Provider:  Marga Melnick MD  CC:  1.) CPX and patient forgot to fast  2.) Shakey and slight chest pain x 1 hour yesterday after eating .  History of Present Illness: Here for Medicare AWV: 1.Risk factors based on Past M, S, F history:see Diagnoses; chart updated. 2.Physical Activities: weights & stretching 3X/week for > 60 minutes 3.Depression/mood: some anxiety & depression  due to   idenity  thefts , husband's health ( S/ P AAA surgery), deaths in extended famiy  4.Hearing: significant loss bilaterally; reluctant to pursue hearing aids for cosmetic reasons 5.ADL's: no limitations 6.Fall Risk: intermittent balance concerns ( [facility] Chi @ [facility] recommended) 7.Home Safety: [facility] safety proofed 8.Height, weight, &visual acuity:see VS 9.Counseling: pre Nuptial agreement in place; POA & Living Will to be assessed 10.Labs ordered based on risk factors: see Orders 11.Referral Coordination: chronic  PNDrainage was evaluated by ENT w/o resolution 12.Care Plan: see Instructions 13.Cognitive Assessment: Oriented X 3; memory & recall good   ; "WORLD " spelled backwards; affect slightly anxious     Hyperlipidemia Follow-Up: She  reports muscle aches (especially in legs), but denies GI upset, abdominal pain, flushing, itching,  constipation,   & diarrhea.  The patient denies the following symptoms: chest pain/pressure, exercise intolerance, dypsnea, palpitations, syncope, and pedal edema.  Compliance with medications (by patient report) has been near 100%.  Dietary compliance has been good.  Adjunctive measures currently used by the patient include ASA & Co Q 10.      Hypertension Follow-Up:She  reports urinary frequency and  stress induced headaches, but denies lightheadedness.  Compliance with medications (by patient report) has been near 100%.  Adjunctive measures currently used by the patient include salt restriction.    Preventive Screening-Counseling & Management  Alcohol-Tobacco     Alcohol drinks/day: <1     Smoking Status: never  Caffeine-Diet-Exercise     Caffeine use/day: 2 cups coffee; occasional tea  Hep-HIV-STD-Contraception     Dental Visit-last 6 months yes     Sun Exposure-Excessive: no  Safety-Violence-Falls     Seat Belt Use: yes     Smoke Detectors: yes      Blood Transfusions:  no.        Travel History:  2002 [place].    Current Medications (verified): 1)  Synthroid 75 Mcg  Tabs (Levothyroxine Sodium) .... Take One Half Tablet Daily Except 1 Pill Q Weds 2)  Lipitor 20 Mg  Tabs (Atorvastatin Calcium) .Marland Kitchen.. 1 By Mouth Every Other Day 3)  Adult Aspirin Low Strength 81 Mg  Tbdp (Aspirin) .... 1/2 Tablet  By Mouth Once Daily 4)  Glucosamine 1500 Complex   Caps (Glucosamine-Chondroit-Vit C-Mn) .Marland Kitchen.. 1 By Mouth Once Daily 5)  Calcium 600 Mg  Tabs (Calcium Carbonate) .... Take One Tablet By Mouth Twice Daily. 6)  Multi-Day Vitamins   Tabs (Multiple Vitamin) .Marland Kitchen.. 1 By Mouth Once Daily 7)  Vitamin D3 2000 Unit Caps (Cholecalciferol) .Marland Kitchen.. 1 By Mouth Two Times A Day 8)  Vitamin C 1000 Mg  Tabs (Ascorbic Acid) .Marland Kitchen.. 1 By Mouth Once Daily 9)  Tylenol Ex St Arthritis Pain 500 Mg  Tabs (Acetaminophen) .... As Needed 10)  Aciphex 20 Mg Tbec (Rabeprazole Sodium) .Marland Kitchen.. 1 Tablet By Mouth Twice A Day 11)   Hydrochlorothiazide 25 Mg Tabs (Hydrochlorothiazide) .... Take 1/2 Tab Once Daily 12)  Vitamin E 400 Unit Caps (Vitamin E) .Marland Kitchen.. 1 By Mouth Once Daily 13)  Ocuvite Preservision  Tabs (Multiple Vitamins-Minerals) .... Take One Tablet By Mouth Once Daily. 14)  Fish Oil   Oil (Fish Oil) .Marland Kitchen.. 1 Tablet 3-7 Times Per Week 15)  Coq10 100 Mg Caps (Coenzyme Q10) .Marland Kitchen.. 1 By Mouth Once Daily 16)  Tramadol Hcl 50 Mg Tabs (Tramadol Hcl) .Marland Kitchen.. 1 Q 6 Hrs As Needed Joint Pain  Allergies: 1)  ! Prevacid  Past History:  Past Medical History: LOW BACK PAIN, CHRONIC (ICD-724.2) PREMATURE VENTRICULAR CONTRACTIONS (ICD-427.69) DIZZINESS (ICD-780.4) HYPERTENSION (ICD-401.9) UNSPECIFIED NEURALGIA NEURITIS AND RADICULITIS (ICD-729.2) DEGENERATIVE DISC DISEASE, CERVICAL SPINE, W/RADICULOPATHY (ICD-722.0) GERD (ICD-530.1) HYPERLIPIDEMIA (ICD-272.2) HYPOTHYROIDISM (ICD-244.9) OSTEOPOROSIS (ICD-733.00) Rib fractures , multiple, PMH of 2003  Past Surgical History: Hysterectomy with Oophorectomy, ? bilateral for Endometriosis mammoplasty gravid 2 para 2; bladder tacking procedure colonoscopy with polypectomy  12/2001 ablation  for PAF 09/2003 colonoscopy :proctitis, tics 03/2004 endoscopy: stricture, hiatal hernia 07/2005 colonoscopy: tics 11/27/2005 Cath  09/2007 : no critical lesions   Family History: brother:TB brother: DVT, PTE three brothers :CAD,MI , CABG aunt :intestinal cancer  mother :CVA father: CAD  Social History: Caffeine use/day:  2 cups coffee; occasional tea Dental Care w/in 6 mos.:  yes Sun Exposure-Excessive:  no Seat Belt Use:  yes Blood Transfusions:  no  Review of Systems General:  Complains of fatigue; denies weight loss. Eyes:  Complains of blurring; denies double vision and vision loss-both eyes. ENT:  Complains of hoarseness and nosebleeds. GI:  Denies bloody stools, dark tarry stools, and indigestion. GU:  Denies hematuria. Derm:  Denies changes in nail beds, dryness,  and hair loss. Neuro:  Denies numbness and tingling. Endo:  Denies cold intolerance and heat intolerance.  Physical Exam  General:  Appears younger than age,in no acute distress; alert,appropriate and cooperative throughout examination Head:  Normocephalic and atraumatic without obvious abnormalities.  Eyes:  No corneal or conjunctival inflammation noted. OD cataract > OS Ears:  External ear exam shows no significant lesions or deformities.  Otoscopic examination reveals clear canals, tympanic membranes are intact bilaterally without bulging, retraction, inflammation or discharge. Hearing is grossly decreased  bilaterally. Nose:  External nasal examination shows no deformity or inflammation. Nasal mucosa are  dry ( especialy R )without lesions or exudates. Mouth:  Oral mucosa and oropharynx without lesions or exudates.  Teeth in good repair. Neck:  No deformities, masses, or tenderness noted. Lungs:  Normal respiratory effort, chest expands symmetrically. Lungs are clear to auscultation, no crackles or wheezes. Heart:  Normal rate and regular rhythm. S1 and S2 normal without gallop, murmur, click, rub .S4 with slurring Abdomen:  Bowel sounds positive,abdomen soft and non-tender without masses, organomegaly or hernias  noted. Msk:  No deformity or scoliosis noted of thoracic or lumbar spine.   Pulses:  R and L carotid,radial,dorsalis pedis and posterior tibial pulses are full and equal bilaterally Extremities:  No clubbing, cyanosis, edema. Minor DIP changes Neurologic:  alert & oriented X3 and DTRs symmetrical and normal.   Skin:  Intact without suspicious lesions or rashes Cervical Nodes:  No lymphadenopathy noted Axillary Nodes:  No palpable lymphadenopathy Psych:  subdued  but  slightly anxious.     Impression & Recommendations:  Problem # 1:  PREVENTIVE HEALTH CARE (ICD-V70.0)  Orders: Medicare -1st Annual Wellness Visit [id number])  Problem # 2:  HYPERLIPIDEMIA (ICD-272.2)  Her  updated medication list for this problem includes:    Lipitor 20 Mg Tabs (Atorvastatin calcium) .Marland Kitchen... 1 by mouth every other day  Problem # 3:  HYPERTENSION (ICD-401.9)  The following medications were removed from the medication list:    Spironolactone 25 Mg Tabs (Spironolactone) .Marland Kitchen... 1 once daily if bp averages > 135/85 Her updated medication list for this problem includes:    Hydrochlorothiazide 25 Mg Tabs (Hydrochlorothiazide) .Marland Kitchen... Take 1/2 tab once daily  Orders: EKG w/ Interpretation (93000)  Problem # 4:  HYPOTHYROIDISM (ICD-244.9)  Her updated medication list for this problem includes:    Synthroid 75 Mcg Tabs (Levothyroxine sodium) .Marland Kitchen... Take one half tablet daily except 1 pill q weds  Problem # 5:  ANXIETY (ICD-300.00) Situational Her updated medication list for this problem includes:    Lorazepam 0.5 Mg Tabs (Lorazepam) .Marland Kitchen... 1 every 8 hrs as needed for anxiety  Complete Medication List: 1)  Synthroid 75 Mcg Tabs (Levothyroxine sodium) .... Take one half tablet daily except 1 pill q weds 2)  Lipitor 20 Mg Tabs (Atorvastatin calcium) .Marland Kitchen.. 1 by mouth every other day 3)  Adult Aspirin Low Strength 81 Mg Tbdp (Aspirin) .... 1/2 tablet  by mouth once daily 4)  Glucosamine 1500 Complex Caps (Glucosamine-chondroit-vit c-mn) .Marland Kitchen.. 1 by mouth once daily 5)  Calcium 600 Mg Tabs (calcium Carbonate)  .... Take one tablet by mouth twice daily. 6)  Multi-day Vitamins Tabs (Multiple vitamin) .Marland Kitchen.. 1 by mouth once daily 7)  Vitamin D3 2000 Unit Caps (Cholecalciferol) .Marland Kitchen.. 1 by mouth two times a day 8)  Vitamin C 1000 Mg Tabs (Ascorbic acid) .Marland Kitchen.. 1 by mouth once daily 9)  Tylenol Ex St Arthritis Pain 500 Mg Tabs (Acetaminophen) .... As needed 10)  Aciphex 20 Mg Tbec (Rabeprazole sodium) .Marland Kitchen.. 1 tablet by mouth twice a day 11)  Hydrochlorothiazide 25 Mg Tabs (Hydrochlorothiazide) .... Take 1/2 tab once daily 12)  Vitamin E 400 Unit Caps (Vitamin e) .Marland Kitchen.. 1 by mouth once daily 13)  Ocuvite  Preservision Tabs (Multiple vitamins-minerals) .... Take one tablet by mouth once daily. 14)  Fish Oil Oil (Fish oil) .Marland Kitchen.. 1 tablet 3-7 times per week 15)  Coq10 100 Mg Caps (Coenzyme q10) .Marland Kitchen.. 1 by mouth once daily 16)  Tramadol Hcl 50 Mg Tabs (Tramadol hcl) .Marland Kitchen.. 1 q 6 hrs as needed joint pain 17)  Lorazepam 0.5 Mg Tabs (Lorazepam) .Marland Kitchen.. 1 every 8 hrs as needed for anxiety  Patient Instructions: 1)  Please consider Audiology evaluation & Ophthalmologic follow up of cataracts.  Schedule fasting labs @ [facility]: vitamin D level; 2)  BMP ; 3)  Hepatic Panel; 4)  Lipid Panel; 5)  TSH ; 6)  CBC w/ Diff.  Prescriptions: LORAZEPAM 0.5 MG TABS (LORAZEPAM) 1 every 8 hrs as needed for anxiety  #30 x 1   Entered and Authorized by:   Marga Melnick MD   Signed by:   Marga Melnick MD on 07/23/2010   Method used:   Printed then faxed to ...       [facility] [phone]* (retail)       [address] [place], [place]  [address]       Ph: [phone]       Fax: [phone]   RxID:   [id number]    Orders Added: 1)  Est. Patient Level III [32440] 2)  EKG w/ Interpretation [93000] 3)  Medicare -1st Annual Wellness Visit [G0438]

## 2010-08-07 NOTE — Procedures (Signed)
Summary: EGD Report/[facility]  EGD Report/[facility]   Imported By: Maryln Gottron 07/29/2010 09:47:55  _____________________________________________________________________  External Attachment:    Type:   Image     Comment:   External Document

## 2010-08-07 NOTE — Procedures (Signed)
Summary: Colonoscopy Report/[facility]  Colonoscopy Report/[facility]   Imported By: Maryln Gottron 07/29/2010 09:46:04  _____________________________________________________________________  External Attachment:    Type:   Image     Comment:   External Document

## 2010-09-08 ENCOUNTER — Encounter: Payer: Self-pay | Admitting: Internal Medicine

## 2010-09-08 ENCOUNTER — Ambulatory Visit (INDEPENDENT_AMBULATORY_CARE_PROVIDER_SITE_OTHER): Payer: Medicare Other | Admitting: Internal Medicine

## 2010-09-08 DIAGNOSIS — M5137 Other intervertebral disc degeneration, lumbosacral region: Secondary | ICD-10-CM

## 2010-09-08 DIAGNOSIS — R110 Nausea: Secondary | ICD-10-CM

## 2010-09-08 DIAGNOSIS — M545 Low back pain, unspecified: Secondary | ICD-10-CM

## 2010-09-08 DIAGNOSIS — IMO0001 Reserved for inherently not codable concepts without codable children: Secondary | ICD-10-CM

## 2010-09-08 DIAGNOSIS — M51379 Other intervertebral disc degeneration, lumbosacral region without mention of lumbar back pain or lower extremity pain: Secondary | ICD-10-CM

## 2010-09-08 MED ORDER — TRAMADOL HCL 50 MG PO TABS: 50.0000 mg | ORAL_TABLET | Freq: Four times a day (QID) | ORAL | Status: AC | PRN

## 2010-09-08 MED ORDER — LORAZEPAM 0.5 MG PO TABS: 0.5000 mg | ORAL_TABLET | Freq: Three times a day (TID) | ORAL | Status: DC | PRN

## 2010-09-08 MED ORDER — ATORVASTATIN CALCIUM 20 MG PO TABS: 20.0000 mg | ORAL_TABLET | ORAL | Status: DC

## 2010-09-08 NOTE — Progress Notes (Signed)
  Subjective:    Patient ID: Shelly Ross, female    DOB: August 25, 1924, 77 y.o.   MRN: [id number]  HPI BACK PAIN  Location: LS spine on L Quality: sharp Onset: this am, but she has intermittent less severe pain for > 2  years Worse with: standing Better with: sitting Radiation: into L thigh to knee Trauma: no Best leaning forward: no  Red Flags Fecal/urinary incontinence: no  Numbness/Weakness: ? Numbness w/o weakness  Fever/chills/sweats: no  Night pain: no  Unexplained weight loss: no  No relief with bedrest: no, not tried  h/o cancer/immunosuppression: no  PMH of osteoporosis : yes, no long term steroids       Review of Systems Patient reports no  Diplopia or vision loss ; intermittent hoarseness from PNDrainage. No  swallowing issues.No gastrointestinal bleeding(melena, rectal bleeding), abdominal pain, significant heartburn,GU symptoms(dysuria, hematuria,pyuria), Gyn symptoms(abnormal  bleeding , pain),  syncope, focal weakness.No  skin/hair /nail changes,abnormal bruising or bleeding. Some  Anxiety due to her husband's health issues. Nausea w/o vomiting just today.     Objective:   Physical Exam  Gen.: Healthy and well-nourished in appearance. Alert, appropriate and cooperative throughout exam.seems slightly anxious. Back pain history vague @ times. Eyes: no icterus. Mouth: Oral mucosa and oropharynx reveal no lesions or exudates. Teeth in good repair. Neck: No deformities, masses, or tenderness noted. Range of motion normal..  Lungs: Normal respiratory effort; chest expands symmetrically. Lungs are clear to auscultation without rales, wheezes, or increased work of breathing. Heart: Normal rate and rhythm. Normal S1 and S2. No gallop, click, or rub. Grade 1/6 systolic  murmur. Abdomen: Bowel sounds normal; abdomen soft and nontender. No masses, organomegaly or hernias noted. Musculoskeletal/extremities: No deformity or scoliosis noted of  the thoracic or lumbar spine. No  clubbing, cyanosis, edema, or deformity noted. Range of motion normal .Tone & strength   good.Joints :minor OA of hands. . Vascular: Carotid, radial artery, dorsalis pedis and dorsalis posterior tibial pulses are full and equal. No bruits present. Neurologic: Alert and oriented x3. Deep tendon reflexes symmetrical and normal. Gait slow with decreased arm swing. Decreased facial animation.Neg SLR to 90 degrees Skin: Intact without suspicious lesions or rashes.  Lymph: No cervical, axillary  lymphadenopathy present.  Psych: Mood and affect are flat;  Clinically depression present .            Assessment & Plan:  #1 back pain with ? Radiculopathy   #2 nausea  #3 situational anxiety/ depression

## 2010-09-08 NOTE — Patient Instructions (Signed)
Use lorazepam every 8 hrs as mild sedative & muscle relaxant. See Dr Melvyn Neth, Chiropractor,  if no better.

## 2010-09-19 ENCOUNTER — Telehealth: Payer: Self-pay | Admitting: Internal Medicine

## 2010-09-19 ENCOUNTER — Other Ambulatory Visit (INDEPENDENT_AMBULATORY_CARE_PROVIDER_SITE_OTHER): Payer: Medicare Other

## 2010-09-19 DIAGNOSIS — R300 Dysuria: Secondary | ICD-10-CM

## 2010-09-19 LAB — URINALYSIS, ROUTINE W REFLEX MICROSCOPIC: Specific Gravity, Urine: 1.01 (ref 1.000–1.030)

## 2010-09-19 NOTE — Telephone Encounter (Addendum)
Left msg for pt to return call.

## 2010-09-19 NOTE — Telephone Encounter (Signed)
Pt wants to know what the rx for Phenazopyridine 200mg  is for.

## 2010-09-21 LAB — URINE CULTURE: Colony Count: 6000

## 2010-09-25 NOTE — Telephone Encounter (Signed)
Pt aware of information.  

## 2010-10-14 NOTE — Cardiovascular Report (Signed)
Shelly Ross, Shelly Ross              ACCOUNT NO.:  [id number]   MEDICAL RECORD NO.:  [id number]          PATIENT TYPE:  INP   LOCATION:  4735                         FACILITY:  [facility]   PHYSICIAN:  Veverly Fells. Excell Seltzer, MD  DATE OF BIRTH:  [birth year]   DATE OF PROCEDURE:  10/03/2007  DATE OF DISCHARGE:  10/03/2007                            CARDIAC CATHETERIZATION   PROCEDURES:  1. Left heart catheterization.  2. Selective coronary angiography.  3. Left ventricular angiography.   INDICATIONS:  Shelly Ross is an 77 year old woman who presented with  chest pain.  She has a history of supraventricular tachycardia and  underwent radiofrequency ablation in 2005.  She has done well in the  interim.  She exercises regularly with no symptoms.  She presented with  chest pain and ruled out for myocardial infarction.  She was referred  for cardiac catheterization.   Risks and indications of the procedure were reviewed with the patient,  informed consent was obtained and the right groin was prepped, draped  and anesthetized with 1% lidocaine.  Using modified Seldinger technique,  a 6-French sheath was placed in the right femoral artery.  Standard 6-  French Judkins catheters were used for the coronary angiography.  There  was some vasospasm in the proximal right coronary artery with pressure  damping when the 6-French JR-4 catheter was in.  I changed out for a 5-  [place] no-torque right catheter and intracoronary nitroglycerin was  given.  Angiography of the right coronary artery was repeated.  Left  ventriculography was performed with a 6-French pigtail catheter.  A  pullback across the aortic valve was done.  All catheter exchanges were  performed over a guidewire.  The patient tolerated the procedure well  and had no immediate complications.   FINDINGS:  Aortic pressure 90/49 with a mean of 67, left ventricular  pressure 87/9.   CORONARY ANGIOGRAPHY FINDINGS:  Left mainstem.  The left mainstem  has an  eccentric orifice.  There appears to be minor narrowing in the range of  20% but after multiple views, there does not appear to be significant  stenosis.  There clearly is tortuous bend in the proximal aspect of the  left main stem.  There is no pressure damping with selective  angiography.   LAD.  The LAD is moderately calcified throughout its proximal and mid  aspects.  There is a diffusely diseased small first diagonal branch with  50% proximal stenosis.  The proximal LAD just after the diagonal has 20%  diffuse stenosis.  The second diagonal is of moderate size and there is  minor ostial narrowing.  The mid LAD just after the second diagonal  branch has a 50% stenosis.  The vessel was small through this area and  distally the LAD divides into a twin LAD system with no significant  obstructive disease.  The LAD just reaches the LV apex.   Left circumflex.  Left circumflex is mildly calcified.  There is no  significant stenosis throughout.  There is a very small first OM branch  and a large second OM branch with no significant  stenosis.   Right coronary artery.  The right coronary artery is moderately  calcified.  The proximal to mid vessel has a long segment of diffuse  nonobstructive disease.  There is a focal 50% stenosis followed by long  diffuse minor stenosis in the range of 30%.  The distal vessel again  becomes normal and has no significant disease.  There is a large acute  marginal branch followed by moderate size PDA branch and a moderate size  posterolateral branch.  There are no significant stenoses in the distal  branches of the right coronary artery.   Left ventriculography shows normal left ventricular systolic function.  The LVEF is estimated at 65%-70%.  There is no significant mitral  regurgitation.   ASSESSMENT:  1. Diffuse mild-to-moderate coronary artery disease.  2. Preserved left ventricular systolic function.   PLAN:  Recommend medical therapy.   Shelly Ross does not have any focal  severe stenoses.  I think she will respond well to aggressive secondary  risk reduction.      Veverly Fells. Excell Seltzer, MD  Electronically Signed     MDC/MEDQ  D:  10/03/2007  T:  10/04/2007  Job:  [id number]   cc:   Doylene Canning. Ladona Ridgel, MD  Dr. Alwyn Ren

## 2010-10-14 NOTE — Assessment & Plan Note (Signed)
[facility]                            CARDIOLOGY OFFICE NOTE   Shelly Ross, Shelly Ross                       MRN:          [id number]  DATE:10/19/2007                            DOB:          Oct 27, 1924    PRIMARY CARE PHYSICIAN:  Dr. Alwyn Ren.   PRIMARY ELECTROPHYSIOLOGIST:  Dr. Lewayne Bunting.   This is an 77 year old white female patient who presented to the  hospital with chest burning and presyncope.  She was admitted and ruled  out for MI and underwent cardiac catheterization by Dr. Excell Seltzer which  showed diffuse mild to moderate coronary disease with a small first  diagonal with 50% proximal stenosis; the proximal LAD had a 20%  stenosis, the second diagonal had 50% stenosis, normal circumflex, focal  50% RCA followed by a long diffuse minor stenosis of about 30% and  normal LV function, ejection fraction 65% to 70%.  There was no  significant mitral regurgitation.  Medical therapy was recommended.  She  did not have any focal severe stenosis and they felt aggressive  secondary risk reduction would be the best way to treat this.   Since the patient was home, she kept having a tickling and a cough in  her upper chest with a similar pain, but that resolved 2 days ago and  she is feeling much better.  She does have history of allergies and does  not know if this is related.  She has not had any more near-syncope  either.  She does have a history of as radiofrequency ablation in 2005  for SVT.   CURRENT MEDICATIONS:  1. Fosamax 70 mg weekly.  2. Calcium 500 mg two daily.  3. Synthroid 0.075 mcg one-half daily.  4. Hydrochlorothiazide 25 mg one-half daily.  5. Lipitor 3 times a week.  6. Aspirin 81 mg daily.  7. Vitamin C daily.  8. Folic acid daily.  9. Ocuvite daily.   PHYSICAL EXAM:  This is a very pleasant young-looking 77 year old white  female in no acute distress.  Blood pressure 118/72, pulse 67.  Weight 139.  NECK:  Without JVD, HJR,  bruit or thyroid enlargement.  LUNGS:  Clear, anterior, posterior and lateral.  HEART:  Regular rate and rhythm at 67 beats per minute.  Normal S1 and  S2.  No rub, bruit or thrill.  There was a slight 1/6 systolic ejection  murmur at the left sternal border and apex.  ABDOMEN:  Soft without organomegaly, masses, lesions or abnormal  tenderness.  Right groin without hematoma or hemorrhage.  LOWER EXTREMITIES:  Without cyanosis, clubbing or edema.  She has good  distal pulses.   IMPRESSION:  1. Chest pain with presyncope, question etiology.  Cardiac      catheterization revealed mild to moderate diffuse coronary artery      disease with normal left ventricular function.  Recommend medical      therapy.  2. History of supraventricular tachycardia, status post radiofrequency      catheter ablation in 2005.  3. Hypertension.  4. Hyperlipidemia.  5. Hypothyroidism.  6. Osteoarthritis.  7. Osteoporosis.  PLAN:  At this time, risk factor modification is recommended at this  time.  She is on a statin as well as good blood pressure control and she  will follow up with Dr. Alwyn Ren for her primary care.  We are available  as needed for further cardiac problems.      [name], PA-C  Electronically Signed      Madolyn Frieze. Jens Som, MD, [facility]  Electronically Signed   ML/MedQ  DD: 10/19/2007  DT: 10/19/2007  Job #: [id number]

## 2010-10-14 NOTE — H&P (Signed)
NAMEADIA, CRAMMER              ACCOUNT NO.:  [id number]   MEDICAL RECORD NO.:  [id number]          PATIENT TYPE:  EMS   LOCATION:  MAJO                         FACILITY:  [facility]   PHYSICIAN:  Thomas C. Wall, MD, FACCDATE OF BIRTH:  [birth year]   DATE OF ADMISSION:  09/30/2007  DATE OF DISCHARGE:                              HISTORY & PHYSICAL   PRIMARY CARE PHYSICIAN:  Peyton Najjar, MD.   PRIMARY CARDIOLOGIST:  Dr. Lewayne Bunting   CHIEF COMPLAINT:  Chest pain.   HISTORY OF PRESENT ILLNESS:  Ms. Fossum is an 77 year old female with no  history of coronary artery disease.  Two days ago she was okay, but then  that evening developed symptoms she thought were heartburn.  She stated  she had upper chest pain that she describes as a burning and tightness  and reached a 5/10.  She took multiple GI medications but it was not  relieved.  She was able to sleep but had symptoms upon ongoing to sleep  and upon waking.  She did not sleep well.  Yesterday she was cleaning  and felt that her symptoms, little worse.  She sat down to rest because  she was having some fatigue in her legs which is not unusual for her.  She had sudden onset of near syncope where she stated the room was  spinning and then started to go dark, but she did not completely lose  consciousness.  During that episode which she cannot tell the duration,  she had no significant change in her chest pain which was ongoing and  had no palpitations.  Today, because she was still having symptoms, she  called her family physician who told her to come to the emergency room.  She lives at the [facility] and told the nurse there about her  symptoms.  EMS was called and her heart rate was apparently variable,  but there is no data to review on this at this time.  Because she was  still having symptoms, EMS gave her sublingual nitroglycerin, and her  symptoms were relieved in approximately 10 minutes.  With the chest  pain, she had  no shortness of breath, nausea, vomiting or diaphoresis.  She has had these symptoms before with exertion, but cannot recall the  last episode and cannot give any more details.  Currently, she is  resting comfortably.   PAST MEDICAL HISTORY:  1. Supraventricular tachycardia with radiofrequency catheter ablation      in 2005.  2. Elevated troponins with normal CK-MBs in the setting of      tachycardia, adenosine Cardiolite showed breast attenuation, but no      ischemia and an EF of 71%.  3. Hypertension.  4. Family history of coronary artery disease.  5. Hyperlipidemia.  6. Hypothyroidism.  7. Osteoporosis and osteoarthritis.   SURGICAL HISTORY:  1. She is status post electrophysiology study with radiofrequency      catheter ablation.  2. Hysterectomy.  3. Colonoscopy multiple times with polypectomies.  4. EGD with dilatation.  5. Rectocele and cystocele repair.   ALLERGIES:  SHE HAD  NAUSEA AND VOMITING AND HEADACHE WITH PERCODAN AND  GI UPSET WITH PREVACID.   CURRENT MEDICATIONS:  1. Fosamax 70 mg a week.  2. Glycolax daily.  3. HCTZ 25 mg 1/2 tablet daily  4. Levothyroxine 75 mcg 1/2 tablet daily.  5. Lipitor 20 mg three times a week.  6. Zegerid possibly 40 mg daily.   SOCIAL HISTORY:  She lives in [place] with her husband and is retired  from a L[employer].  She has no history of alcohol, tobacco or drug  abuse and exercises regularly.  She feels she eats a heart healthy diet.   FAMILY HISTORY:  Both of her parents were 38 when they died.  Her mother  had a stroke, but her father did have heart disease.  She also has two  brothers with coronary artery disease.   REVIEW OF SYSTEMS:  She has had more reflux symptoms than usual recently  and has problems with chronic constipation.  She also has chronic  arthralgias.  The presyncope that she had is similar to the symptoms  when she had the SVT.  However, she also had palpitations with the SVT  and did not have  palpitations with the presyncope yesterday.  Chest pain  is described above.  She has had no fevers or chills and no hematemesis,  hemoptysis or melena.  Full 14-point review of systems is otherwise  negative.   PHYSICAL EXAMINATION:  VITAL SIGNS:  Temperature is 98.6, blood pressure  124/70, pulse 65, respiratory rate 16, O2 saturation 99% on 2 liters.  GENERAL:  She is a well-developed elderly white female in no acute  distress.  HEENT:  Normal.  NECK:  There is no lymphadenopathy, thyromegaly, bruit or JVD noted.  CV:  Heart is regular in rate and rhythm with an S1-S2 and no  significant murmur, rub or gallop is noted.  Distal pulses are intact in  all four extremities and no femoral bruits are appreciated.  LUNGS:  Essentially clear to auscultation bilaterally.  SKIN:  No rashes or lesions are noted.  ABDOMEN:  Soft as she has active bowel sounds.  There is slight left  lower quadrant tenderness but there is no guarding.  She has no  splenomegaly.  EXTREMITIES:  There is no cyanosis, clubbing or edema noted.  MUSCULOSKELETAL:  There is no joint deformity or effusions and no spine  or CVA tenderness.  NEUROLOGICAL:  She is alert and oriented.  Cranial nerves II-XII grossly  intact.   STUDIES:  Chest X-Ray: No acute disease.   EKG sinus bradycardia rate 59 with occasional PVCs, no acute ischemic  changes are noted.   LABORATORY DATA:  Laboratory values are pending except for point of care  markers are negative times one and a urinalysis showed trace leukocyte  esterase with a few bacteria.   IMPRESSION:  1. Chest pain.  She had prolonged symptoms without acute EKG changes      and her initial point of care markers are negative.  However, she      has multiple cardiac risk factors and her symptoms resolved with      nitroglycerin.  She will therefore be placed in the high risk      category and be started on IV heparin and IV nitroglycerin.  She      will be admitted.  We  will cycle cardiac enzymes.  Cardiac      catheterization will be performed on Monday.  To catheterization  will be done acutely if she has recurrent systems not responsive to      medications.  2. Near syncope.  On telemetry in the emergency room she has had      regular PVCs but no couplets or salvos and her heart rate has been      either normal or mildly bradycardic.  She also had no palpitations      such as she had with the near syncope that was associated with the      SVT.  She will be followed closely on telemetry.  3. Hypothyroidism.  Check TSH.  4. Hyperlipidemia.  Check lipid profile and continue Lipitor.  5. She is otherwise stable and will be continued on her home      medications but we will hold the HCTZ for possible cath.      Theodore Demark, PA-C      Jesse Sans. Daleen Squibb, MD, [facility]  Electronically Signed    RB/MEDQ  D:  09/30/2007  T:  09/30/2007  Job:  [id number]   cc:   Peyton Najjar, MD

## 2010-10-14 NOTE — Assessment & Plan Note (Signed)
[facility]                            CARDIOLOGY OFFICE NOTE   LINDAMARIE, MACLACHLAN                       MRN:          [id number]  DATE:02/01/2008                            DOB:          January 26, 1925    Ms. Binning is an 77 year old female who has previously been followed by  Dr. Ladona Ridgel.  Per the notes, the patient has had a previous SVT ablation.  This was performed in June 2005.  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  She apparently was recently admitted to [facility] with chest pain.  She underwent cardiac catheterization by  Dr. Excell Seltzer on Oct 03, 2007.  She was found to have moderate  nonobstructive coronary artery disease.  Her LV function was normal.  Medical therapy was recommended.  She was seen in followup on August 19, 2007 by [name].  She was recently sent back to cardiology for  evaluation of presyncopal episodes.  She has had these for approximately  1 year.  She states she will suddenly feel her heart flutter.  This is  associated with near syncope but no frank syncope.  This lasts for  approximately 1 minute and resolves spontaneously.  She also feels weak  for approximately 1 day afterwards.  There is no associated chest pain,  shortness of breath or nausea or vomiting.  There is also no weakness or  loss of strength in her extremities.  There is no incontinence or  seizure activity.  Because of the above, she returns for followup.   MEDICATIONS:  1. Omeprazole 20 mg p.o. daily.  2. Glucosamine 1500 mg p.o. daily.  3. Biotin.  4. Calcium.  5. Multivitamin.  6. Vitamin D.  7. Vitamin E.  8. Lipitor 20 mg p.o. daily.  9. Aspirin 81 mg p.o. daily.  10.Vitamin C.  11.Folate.  12.Ocuvite.  13.She also takes Synthroid 0.075 mg tablets one half p.o. daily.  14.Fosamax.   PHYSICAL EXAMINATION:  VITAL SIGNS:  Today shows a blood pressure 114/67  and pulse is 71.  She weighs 141 pounds.  HEENT:   Normal.  NECK:  Supple.  CHEST:  Clear.  CARDIOVASCULAR:  Regular rate and rhythm.  ABDOMEN:  No tenderness.  EXTREMITIES:  No edema.   Her electrocardiogram shows a sinus rhythm at a rate of 72.  The axis is  normal.  There are occasional PVCs.   DIAGNOSES:  1. Palpitations/presyncope - etiology of this is unclear.  These      palpitations do not appears to be sustained, therefore, I doubt      recurrent supraventricular tachycardia.  We will plan to proceed      with a CardioNet monitor for further evaluation.  I will then have      her follow up with Dr. Ladona Ridgel for further management as he has seen      her in the past and she was expecting see him today.  2. Coronary artery disease - she will continue on her aspirin and      statin.  3. Hyperlipidemia -  she will continue on her statin.  A goal LDL      should be less than 70 given her history of coronary artery      disease.  4. Hypertension - her blood pressure appears to be adequately      controlled on no medications.  5. Osteoporosis.  6. Hypothyroidism - she will continue on her Synthroid.     Madolyn Frieze Jens Som, MD, [facility]  Electronically Signed    BSC/MedQ  DD: 02/01/2008  DT: 02/02/2008  Job #: [id number]

## 2010-10-14 NOTE — Assessment & Plan Note (Signed)
[facility]                         ELECTROPHYSIOLOGY OFFICE NOTE   SAIDE, LANUZA                       MRN:          [id number]  DATE:03/12/2008                            DOB:          20-Aug-1924    Ms. Colasurdo returns today for followup.  I had not seen her in several  years.  She had seen my partner, Dr. Jens Som, several weeks ago.  The  patient has a history of SVT status post ablation and developed an  episode of near syncope.  There is no obvious etiology.  She wore  cardiac monitor for 3 weeks demonstrated no obvious etiology of her  syncope.  She returns today for followup.  She denies chest pain or  shortness of breath.   Her medications today include omeprazole 20 a day.  She is on multiple  vitamins.   PHYSICAL EXAMINATION:  GENERAL:  She is a pleasant, well-appearing  elderly woman in no distress.  VITAL SIGNS:  Blood pressure was 90/60, the pulse was 76 and regular,  respirations were 18, weight was 141 pounds.  NECK:  No jugular venous distention.  LUNGS:  Clear bilaterally to auscultation.  No wheezes, rales, or  rhonchi are present.  There is no increased work of breathing.  CARDIOVASCULAR:  Regular rate and rhythm.  Normal S1 and S2.  ABDOMEN:  Soft, nontender.  There is no organomegaly.  EXTREMITIES:  No edema.   INTERROGATION:  Review of her 3-week cardiac event monitor demonstrated  sinus rhythm with rare PVCs.  There were no brady or tachy episodes to  explain her syncope.   IMPRESSION:  1. Unexplained near syncope.  2. History of supraventricular tachycardia status post ablation.  3. History of dyslipidemia.   DISCUSSION:  Ms. Roes is presently stable.  Etiology of her spells is  unclear at this time.  I have given her several options, but I have  recommended a period of watchful waiting.  If she has another episode of  syncope, we would have her inserted an implantable loop recorder.      Doylene Canning. Ladona Ridgel,  MD  Electronically Signed    GWT/MedQ  DD: 03/12/2008  DT: 03/13/2008  Job #: [id number]

## 2010-10-14 NOTE — Discharge Summary (Signed)
Shelly Ross, Shelly Ross              ACCOUNT NO.:  [id number]   MEDICAL RECORD NO.:  [id number]          PATIENT TYPE:  INP   LOCATION:  4735                         FACILITY:  [facility]   PHYSICIAN:  Veverly Fells. Excell Seltzer, MD  DATE OF BIRTH:  1925/02/07   DATE OF ADMISSION:  09/30/2007  DATE OF DISCHARGE:  10/03/2007                               DISCHARGE SUMMARY   PRIMARY CARDIOLOGIST:  Doylene Canning. Ladona Ridgel, MD.   PRIMARY CARE Shila Kruczek:  Sandria Bales. Alwyn Ren, MD.   DISCHARGE DIAGNOSIS:  Chest pain.   SECONDARY DIAGNOSES:  1. Nonobstructive coronary artery disease by cardiac catheterization      this admission.  2. History of supraventricular tachycardia status post radiofrequency      catheter ablation in 2005.  3. Hypertension.  4. Hyperlipidemia.  5. Hypothyroidism.  6. Osteoporosis.  7. Osteoarthritis.   ALLERGIES:  PERCODAN causes nausea, vomiting, and headache,  PREVACID  causes GI upset.   PROCEDURES:  Left heart cardiac catheterization.   HISTORY OF PRESENT ILLNESS:  An 77 year old Caucasian female without  prior history of coronary artery disease who was in the usual state of  health until approximately 2 days prior to admission when she began to  experience heartburn symptoms not relieved by antacid therapy.  September 29, 2007, while cleaning, she had lightheadedness with fatigue in her  leg with ongoing indigestion and chest pain.  Symptoms were  intermittent throughout the day and on Sep 30, 2007, she called her  primary care Elyn Krogh and was advised to present to the [facility] ED.  In ED, Cardiology was called and her ECG showed no acute changes while  her troponin was normal.  She was admitted for further evaluation.   HOSPITAL COURSE:  The patient ruled out for MI.  Decision was made to  pursue left heart cardiac catheterization and this scheduled for Oct 03, 2007.  She was taken to the cardiac cath lab this morning and underwent  left heart cardiac catheterization revealing  nonobstructive coronary  artery disease with normal LV function and left ventricular ejection  fraction of 65%.  There were no targets for intervention and it was felt  that she would be best managed medically.  She will continue on aspirin  and statin therapy.  Due to blood pressures in the 90 systolic range, we  have not initiated beta blocker therapy.  Postprocedure, [name] has  been ambulating without difficulty and will be discharged home today in  good condition.   DISCHARGE LABS:  Hemoglobin 14.0, hematocrit 41.6, WBC 8.1, platelets  261, MCV 91.6.  Sodium 142, potassium 3.9, chloride 105, CO2 26, BUN 13,  creatinine 0.68, glucose 82.  Total bilirubin 0.7, alkaline phosphatase  51, AST 23, ALT 23, albumin 3.3, CK 79, MB 2.3, troponin-I less than  0.01.  Total cholesterol 156, triglycerides 31, HDL 57, LDL 93, calcium  9.5.  Urinalysis was negative.  TSH 2.830.   DISPOSITION:  The patient is being discharged home today in good  condition.   FOLLOW-UP PLANS AND APPOINTMENTS:  We have arranged for followup in our  office [name], physician assistant on Oct 19, 2007, at 8:45 a.m.  We have asked her to follow up with Dr. Alwyn Ren in the next 3-4 weeks.   DISCHARGE MEDICATIONS:  1. Aspirin 81 mg daily.  2. Lipitor 20 mg three times a week.  3. Synthroid 75 mcg half a tablet daily.  4. Fosamax 70 mg q. week.  5. GlycoLax 17 g daily.  6. Zegerid as previously prescribed.  7. Hydrochlorothiazide 25 mg half tablet daily.   OUTSTANDING LAB STUDIES:  None.   DURATION DISCHARGE ENCOUNTER:  40 minutes including physician time.      Shelly Ross, ANP      Veverly Fells. Excell Seltzer, MD  Electronically Signed    CB/MEDQ  D:  10/03/2007  T:  10/04/2007  Job:  [id number]   cc:   Peyton Najjar, MD

## 2010-10-17 NOTE — Discharge Summary (Signed)
NAME:  Shelly Ross, Shelly Ross                      ACCOUNT NO.:  [id number]   MEDICAL RECORD NO.:  [id number]                   PATIENT TYPE:  INP   LOCATION:  4731                                 FACILITY:  [facility]   PHYSICIAN:  Rene Paci, M.D. [facility]          DATE OF BIRTH:  09/20/1924   DATE OF ADMISSION:  11/21/2003  DATE OF DISCHARGE:  11/22/2003                                 DISCHARGE SUMMARY   DISCHARGE DIAGNOSES:  1. Supraventricular tachycardia.  2. Status post RS ablation.  3. Elevated troponin I.  4. Hypothyroidism.  5. Hypertension.   BRIEF ADMISSION HISTORY:  Ms. Nolen Mu is a 77 year old white female who  presents with tachycardia.  The patient has a history of SVT that has become  more frequent recently.  The patient was evaluated by EMS and found to have  SVT in the office.  She was converted with adenosine.   PAST MEDICAL HISTORY:  1. Hypothyroidism.  2. Hypertension.  3. Osteoporosis.  4. Osteoarthritis.   HOSPITAL COURSE:  1. Cardiovascular.  The patient presented with SVT that converted with     adenosine.  She was evaluated by Dr. Lewayne Bunting with Cardiology.  He     took the patient to the EP lab.  The patient underwent RF ablation on     11/21/2003.  This was successful.  Of note, the patient had elevated     troponin I's, although her CKMBs were normal.  Dr. Ladona Ridgel recommended     obtaining a 2D echo which has been done.  Results are still pending.  He     also felt the patient was stable for an outpatient adenosine Cardiolite.     This has also been arranged.  2. Hypothyroidism.  The patient's TSH was normal.  3. Hypertension.  The patient's blood pressure was also normal.   FOLLOW-UP:  The patient is scheduled to follow-up for an adenosine  Cardiolite on July 6 at 8:00 A.M.  She has an appointment with Dr. Ladona Ridgel on  August 9 at 9:45 A.M. and she needs follow-up with Dr. Lovell Sheehan in one to two  weeks.      Cornell Barman, P.A. [facility]                   Rene Paci, M.D. [facility]   LC/MEDQ  D:  11/22/2003  T:  11/24/2003  Job:  [id number]   cc:   Stacie Glaze, M.D. [facility]   Doylene Canning. Ladona Ridgel, M.D.

## 2010-10-17 NOTE — Op Note (Signed)
NAME:  Shelly Ross, Shelly Ross                      ACCOUNT NO.:  [id number]   MEDICAL RECORD NO.:  [id number]                   PATIENT TYPE:  INP   LOCATION:  4731                                 FACILITY:  [facility]   PHYSICIAN:  Doylene Canning. Ladona Ridgel, M.D.               DATE OF BIRTH:  [birth year]   DATE OF PROCEDURE:  11/21/2003  DATE OF DISCHARGE:  11/22/2003                                 OPERATIVE REPORT   PROCEDURE PERFORMED:  Electrophysiologic study and RF catheter ablation of  AV node re-entrant tachycardia.   I:  INTRODUCTION:  The patient is a very pleasant 77 year old woman with a  one year history of recurrent tachy palpitations.  These will typically  start and stop suddenly and are associated with near syncope and shortness  of breath.  Over the last several months, they have increased in frequency  and severity.  She was admitted to the hospital yesterday with recurrent SVT  which persisted for over an hour.  On arrival of the paramedics, she was  short of breath and found to be in a narrow QRS tachycardia at a rate of 185  beats per minute.  She was given intravenous Adenosine with termination of  her tachycardia.  She is now referred for electrophysiologic study and  catheter ablation.   II:  PROCEDURE:  After informed consent was obtained, the patient was taken  to the diagnostic EP lab in a fasting state.  After the usual preparation  and draping, intravenous Fentanyl and Midazolam were given for sedation.  A  6 French hexapolar catheter was inserted percutaneously in the right jugular  vein and advanced to the coronary sinus.  A 5 French quadripolar catheter  was inserted percutaneously in the right femoral vein and advanced to the  right ventricle.  A 5 French quadripolar catheter was inserted  percutaneously in the right femoral vein and advanced to the His bundle  region.  A 5 French quadripolar catheter was inserted percutaneously in the  right femoral vein and advanced  to the right atrium.  Rapid ventricular  pacing was carried out from the RV apex and step wise decreased down to 400  milliseconds where VA Wenckebach was observed.  During rapid ventricular  pacing, the atrial activation was midline and decremental.  Next, programmed  ventricular stimulation was carried out from the RV apex at a base cycle  length of 600 milliseconds.  The S1 and S2 interval were step wise decreased  down to 290 milliseconds where ventricular refractoriness was observed.  During programmed ventricular stimulation, the atrial activation was again  midline and decremental.  Next, programmed atrial stimulation was carried  out from the coronary sinus as well as the high right atrium at a base cycle  length of 600/500/400 milliseconds.  The S1 and S2 interval was step wise  decreased from down to the AV node ERP.  During programmed atrial  stimulation, there were  multiple AH jumps and echo beats and episodes of  nonsustained SVT.  These, however, were not sustained.  The patient  underwent rapid atrial pacing from the coronary sinus as well as the high  right atrium and, again, step wise decreased down to AV Wenckebach.  Again,  during rapid atrial pacing, the PR interval was greater than the RR interval  and there was nonsustained SVT.  However, SVT could not be sustained.  Next,  isoproterenol was infused from 1 to 2 mcg per minute and additional rapid  atrial and ventricular pacing was carried out.  Despite this isoproterenol,  only nonsustained SVT could be initiated.  Mapping of the SVT demonstrated a  short RP tachycardia with a very early [place] time and midline atrial activation  with the earliest during tachycardia in the His bundle region.  At this  point, with no evidence of any accessory pathway conduction and no inducible  atrial tachycardia, and the finding of a short RP tachycardia was terminated  with adenosine, it was felt that the patient had AV node re-entrant   tachycardia.  Slow pathway ablation was carried out.  A total of two RF  energy applications were delivered to the slow pathway region resulting in  prolonged decelerated junctional rhythm.  Following ablation, there was no  slow pathway conduction, no AH jumps, no echo beats, and the PR interval was  less than the RR interval.  The patient was observed for approximately ten  minutes and then the catheters were removed.  Hemostasis was assured and the  patient was returned to her room in satisfactory condition.   III:  COMPLICATIONS:  There were no immediate procedure complications.   IV:  RESULTS:  A.  Baseline ECG.  The baseline ECG demonstrates normal sinus rhythm with  normal axis and intervals.  There was no pre-excitation.  B.  Baseline intervals.  The sinus node cycle length was 853 milliseconds,  the AH interval 89 milliseconds, the HV interval 57 milliseconds, QRS  duration 65 milliseconds.  C.  Rapid ventricular pacing.  Rapid ventricular pacing demonstrated VA  Wenckebach cycle length of 400 milliseconds.  During rapid ventricular  pacing, the atrial activation was midline and decremental.  D.  Programmed ventricular stimulation.  Programmed ventricular stimulation  was carried out from the RV apex at a base cycle length of 600 milliseconds.  The S1 and S2 interval was step wise decreased from 540 milliseconds down to  290 milliseconds where ventricular refraction was observed.  During  programmed ventricular stimulation, the atrial activation sequence was  midline and decremental.  E.  Rapid atrial pacing.  Rapid atrial was carried out from the high right  atrium as well as the coronary sinus at base cycle length of 600  milliseconds and step wise decreased down to 420 milliseconds where AV  Wenckebach was observed.  During rapid atrial pacing, the PR interval was greater than the RA interval and there was nonsustained SVT present.  F.  Programmed atrial stimulation.   Programmed atrial stimulation was  carried out from the high right atrium as well as the coronary sinus with a  base cycle length of 600/500/400 milliseconds which resulted in multiple AH  jumps and echo beats as well as nonsustained SVT.  However, there was no  sustained SVT.  G.  Arrhythmias observed:  1. Nonsustained AV node re-entrant tachycardia, initiation programmed atrial     stimulation and rapid atrial pacing, duration sustained, cycle length  varied between 340 and 400 milliseconds, method of termination was     spontaneous.     A. RF energy application.  A total of two RF energy applications were        delivered to the slow pathway region rendering this slow pathway        nonfunctioning.   V:  CONCLUSION:  The study demonstrates successful electrophysiologic study  and RF catheter ablation of a slow pathway in a patient with a history of  SVT rendering the tachycardia noninducible.                                               Doylene Canning. Ladona Ridgel, M.D.    GWT/MEDQ  D:  11/21/2003  T:  11/22/2003  Job:  [id number]   cc:   Titus Dubin. Alwyn Ren, M.D. [facility]

## 2010-10-23 ENCOUNTER — Other Ambulatory Visit: Payer: Self-pay | Admitting: *Deleted

## 2010-10-23 MED ORDER — LEVOTHYROXINE SODIUM 75 MCG PO TABS: ORAL_TABLET | ORAL | Status: DC

## 2010-10-23 MED ORDER — HYDROCHLOROTHIAZIDE 25 MG PO TABS: ORAL_TABLET | ORAL | Status: DC

## 2011-01-21 ENCOUNTER — Ambulatory Visit (INDEPENDENT_AMBULATORY_CARE_PROVIDER_SITE_OTHER): Payer: Medicare Other | Admitting: Internal Medicine

## 2011-01-21 ENCOUNTER — Encounter: Payer: Self-pay | Admitting: Internal Medicine

## 2011-01-21 DIAGNOSIS — E039 Hypothyroidism, unspecified: Secondary | ICD-10-CM

## 2011-01-21 DIAGNOSIS — I10 Essential (primary) hypertension: Secondary | ICD-10-CM

## 2011-01-21 DIAGNOSIS — F411 Generalized anxiety disorder: Secondary | ICD-10-CM

## 2011-01-21 DIAGNOSIS — I959 Hypotension, unspecified: Secondary | ICD-10-CM

## 2011-01-21 NOTE — Patient Instructions (Signed)
Stop the HCTZ; followup in one week with blood pressure measurements.Blood Pressure Goal  Ideally is an AVERAGE < 135/85. This AVERAGE should be calculated from @ least 5-7 BP readings taken @ different times of day on different days of week. You should not respond to isolated BP readings , but rather the AVERAGE for that week

## 2011-01-21 NOTE — Progress Notes (Signed)
  Subjective:    Patient ID: Shelly Ross, female    DOB: 07-30-1924, 76 y.o.   MRN: [id number]  HPI [name] presents to evaluate 2 episodes of hypotension. 2 weeks ago while sitting she felt flushed and uncomfortable. Her blood pressure at that time was 60+ over 40+. She was seen by the nurse at the [facility] home who recommended rest and monitor.  This morning and she felt weak and had some headache; blood pressure was 82/60.  She is on hydrochlorothiazide 25 mg one half pill daily  She denies chest pain, palpitations, shortness of breath, melena, rectal bleeding.    Review of Systems she describes sleeping restlessly. 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      Objective:   Physical Exam on exam she's in no acute distress.  Chest is clear without rhonchi, rales, or wheezes. Recheck of the pulse ox was 96%  She has an S4 with a regular rhythm a grade 1/2-1 systolic murmur.  Pulses are intact without bruits.  She is alert and oriented; does not appear especially anxious.          Assessment & Plan: #1 hypotensive episodes x2  #2 #2

## 2011-01-22 LAB — ALT: ALT: 20 U/L (ref 0–35)

## 2011-01-22 LAB — CBC WITH DIFFERENTIAL/PLATELET
Basophils Absolute: 0 10*3/uL (ref 0.0–0.1)
Eosinophils Absolute: 0.2 10*3/uL (ref 0.0–0.7)
Eosinophils Relative: 3.4 % (ref 0.0–5.0)
MCV: 93 fl (ref 78.0–100.0)
Monocytes Absolute: 0.5 10*3/uL (ref 0.1–1.0)
Neutrophils Relative %: 35.9 % — ABNORMAL LOW (ref 43.0–77.0)
Platelets: 261 10*3/uL (ref 150.0–400.0)
RDW: 13.3 % (ref 11.5–14.6)
WBC: 7.1 10*3/uL (ref 4.5–10.5)

## 2011-01-22 LAB — BASIC METABOLIC PANEL
BUN: 20 mg/dL (ref 6–23)
Chloride: 103 mEq/L (ref 96–112)
Creatinine, Ser: 0.8 mg/dL (ref 0.4–1.2)
Glucose, Bld: 94 mg/dL (ref 70–99)

## 2011-01-22 LAB — TSH: TSH: 1.29 u[IU]/mL (ref 0.35–5.50)

## 2011-01-28 ENCOUNTER — Ambulatory Visit (INDEPENDENT_AMBULATORY_CARE_PROVIDER_SITE_OTHER): Payer: Medicare Other | Admitting: Internal Medicine

## 2011-01-28 VITALS — BP 116/70 | HR 77 | Wt 133.0 lb

## 2011-01-28 DIAGNOSIS — F439 Reaction to severe stress, unspecified: Secondary | ICD-10-CM

## 2011-01-28 DIAGNOSIS — I8390 Asymptomatic varicose veins of unspecified lower extremity: Secondary | ICD-10-CM

## 2011-01-28 DIAGNOSIS — R232 Flushing: Secondary | ICD-10-CM

## 2011-01-28 DIAGNOSIS — N951 Menopausal and female climacteric states: Secondary | ICD-10-CM

## 2011-01-28 DIAGNOSIS — I8393 Asymptomatic varicose veins of bilateral lower extremities: Secondary | ICD-10-CM

## 2011-01-28 DIAGNOSIS — I959 Hypotension, unspecified: Secondary | ICD-10-CM

## 2011-01-28 DIAGNOSIS — Z733 Stress, not elsewhere classified: Secondary | ICD-10-CM

## 2011-01-28 DIAGNOSIS — D72820 Lymphocytosis (symptomatic): Secondary | ICD-10-CM

## 2011-01-28 MED ORDER — SERTRALINE HCL 25 MG PO TABS: 25.0000 mg | ORAL_TABLET | Freq: Every day | ORAL | Status: DC

## 2011-01-28 NOTE — Patient Instructions (Signed)
Wear knee high support hose to keep the blood pressure from dropping and to prevent edema and worsening of varicose veins.  Please  schedule repeat  Labs in 6 weeks :  CBC & dif (lymphocytosis)

## 2011-01-28 NOTE — Progress Notes (Signed)
  Subjective:    Patient ID: Shelly Ross, female    DOB: 1924-10-17, 77 y.o.   MRN: [id number]  HPI Shelly Ross returns for followup. Extensive lab studies were reviewed. All are excellent except for a slight reversal in the neutrophil/lymphocyte differential. She describes some cough and nasal congestion and sweats, mainly during day. She has not had fever or chills or purulent secretions.  Her blood pressure has varied from 98/58-127/78. This is off HCTZ.  One of the varicose veins ruptured recently with profuse bleeding. She also has had some  aching in her calves.    Review of Systems     Objective:   Physical Exam Gen.: Thin but  well-nourished in appearance. Alert, appropriate and cooperative throughout exam. Neck: No deformities, masses, or tenderness noted.  Thyroid normal. Lungs: Normal respiratory effort; chest expands symmetrically. Lungs are clear to auscultation without rales, wheezes, or increased work of breathing. Heart: Normal rate and rhythm. Normal S1 and S2. No gallop, click, or rub. Faint grade 1/6 systolic  murmur. Musculoskeletal/extremities:  No clubbing, cyanosis, edema, or deformity noted. Nail health  good. Vascular: Carotid, radial artery, dorsalis pedis and  posterior tibial pulses are full and equal. No bruits present. Neurologic: Alert and oriented x3. Deep tendon reflexes symmetrical and normal. She has significant venous titers over both lower extremities. There is evidence that one variceal has ruptured and formed eschar.         Skin: Intact without suspicious lesions or rashes. Lymph: No cervical, axillary  lymphadenopathy present. Psych: Mood and affect are normal. Normally interactive                                                                                         Assessment & Plan:  #1 slight lymphocytosis; this should be monitored. There is no evidence of active process  #2 symptomatic varicose veins. Vascular clinic referral as  indicated  #3 hypotension off HCTZ. Support  Hose may be helpful  #4 exogenous stress mainly due to her husband's significant health issues.  Plan see orders and recommendations.

## 2011-02-03 ENCOUNTER — Other Ambulatory Visit: Payer: Self-pay | Admitting: Lab

## 2011-02-03 DIAGNOSIS — I83893 Varicose veins of bilateral lower extremities with other complications: Secondary | ICD-10-CM

## 2011-02-16 ENCOUNTER — Encounter: Payer: Self-pay | Admitting: Internal Medicine

## 2011-03-04 ENCOUNTER — Encounter: Payer: Self-pay | Admitting: Internal Medicine

## 2011-03-04 ENCOUNTER — Ambulatory Visit: Payer: Medicare Other | Admitting: Internal Medicine

## 2011-03-04 DIAGNOSIS — Z0289 Encounter for other administrative examinations: Secondary | ICD-10-CM

## 2011-03-05 ENCOUNTER — Ambulatory Visit (INDEPENDENT_AMBULATORY_CARE_PROVIDER_SITE_OTHER): Payer: Medicare Other | Admitting: Internal Medicine

## 2011-03-05 ENCOUNTER — Encounter: Payer: Self-pay | Admitting: Internal Medicine

## 2011-03-05 DIAGNOSIS — R2681 Unsteadiness on feet: Secondary | ICD-10-CM

## 2011-03-05 DIAGNOSIS — D72820 Lymphocytosis (symptomatic): Secondary | ICD-10-CM

## 2011-03-05 DIAGNOSIS — R51 Headache: Secondary | ICD-10-CM

## 2011-03-05 DIAGNOSIS — I10 Essential (primary) hypertension: Secondary | ICD-10-CM

## 2011-03-05 DIAGNOSIS — R269 Unspecified abnormalities of gait and mobility: Secondary | ICD-10-CM

## 2011-03-05 LAB — CBC WITH DIFFERENTIAL/PLATELET
Basophils Relative: 0.4 % (ref 0.0–3.0)
Eosinophils Absolute: 0.4 10*3/uL (ref 0.0–0.7)
Eosinophils Relative: 5.8 % — ABNORMAL HIGH (ref 0.0–5.0)
HCT: 44.4 % (ref 36.0–46.0)
Lymphs Abs: 2.5 10*3/uL (ref 0.7–4.0)
MCHC: 33.3 g/dL (ref 30.0–36.0)
MCV: 92.9 fl (ref 78.0–100.0)
Monocytes Absolute: 0.5 10*3/uL (ref 0.1–1.0)
Neutrophils Relative %: 50 % (ref 43.0–77.0)
Platelets: 281 10*3/uL (ref 150.0–400.0)
WBC: 6.9 10*3/uL (ref 4.5–10.5)

## 2011-03-05 MED ORDER — SPIRONOLACTONE 25 MG PO TABS: ORAL_TABLET | ORAL | Status: DC

## 2011-03-05 NOTE — Patient Instructions (Addendum)
Your BP goal = AVERAGE < 135/85. Avoid ingestion of  excess salt/sodium.Cook with pepper & other spices . Use the salt substitute "No Salt"(unless your potassium has been elevated) OR the Mrs Sharilyn Sites products to season food @ the table. Avoid foods which taste salty or "vinegary" as their sodium contentet will be high. Please keep a diary of your headaches . Document  each occurrence on the calendar with notation of : #1 any prodrome ( any non headache symptom such as marked fatigue,visual changes, ,etc ) which precedes actual headache ; #2) severity on 1-10 scale; #3) any triggers ( food/ drink,enviromenntal or weather changes ,physical or emotional stress) in 8-12 hour period prior to the headache; & #4) response to any medications or other intervention. Please review "Headache" @ [facility] for additional information.   Please see your  Ophthalmologist as soon as possible to evaluate the headache and eye symptoms.

## 2011-03-05 NOTE — Progress Notes (Signed)
Subjective:    Patient ID: Shelly Ross, female    DOB: [birth year], 77 y.o.   MRN: [id number]  HPI HYPERTENSION: Disease Monitoring  Blood pressure range: 98/55- 154/93  Chest pain: no   Dyspnea: no   Claudication: no   Medication compliance: yes  Medication Side Effects  Lightheadedness: ?  Urinary frequency: yes,    Edema: no, only if standing "all day"      Preventitive Healthcare:  Exercise: no , due to husband's health  Diet Pattern: no plan  Salt Restriction: no added salt  HEADACHE : Onset: 4 days ago   Location: L temple & behind OS  Quality: burning Frequency: only with elevated BP  Precipitating factors: BP  Prior treatment: none besides BP meds Associated Symptoms Nausea/vomiting: no  Photophobia/phonophobia: sound makes headache worse Tearing of eyes: no  Sinus pain/pressure: no  Personal stressors: yes as noted  Red Flags Fever: no  Neck pain/stiffness: no  Vision/speech/swallow/hearing difficulty: no, hypersensitive with new hearing aid    Focal weakness/numbness: no  Altered mental status: no  Trauma: no           Review of Systems she attributes her in balance to weakness in her knees. She describes difficulty getting up.     Objective:   Physical Exam Gen.: Healthy and well-nourished in appearance. Alert, appropriate and cooperative throughout exam. Head: Normocephalic without obvious abnormalities  Eyes: No corneal or conjunctival inflammation noted. Pupils: ? Minimal anisocoria , OS > OD  FOV normal. Extraocular motion intact. Vision grossly normal with lenses. Ears:  Hearing  Aids  bilaterally. Nose: External nasal exam reveals no deformity or inflammation. Nasal mucosa are pink and moist. No lesions or exudates noted. Mouth: Oral mucosa and oropharynx reveal no lesions or exudates. Teeth in good repair. Neck: No deformities, masses, or tenderness noted. Range of motion slightly decreased.  Lungs: Normal respiratory effort; chest  expands symmetrically. Lungs are clear to auscultation without rales, wheezes, or increased work of breathing. Heart: Normal rate and rhythm. Normal S1 and S2. No gallop, click, or rub. S4 w/o  murmur.                                                                              Musculoskeletal/extremities: No clubbing, cyanosis, edema noted. Tone & strength  Normal.Joints; mild OA changes. Nail health  Good. Crepitus of knees Vascular: Carotid, radial artery, dorsalis pedis and  posterior tibial pulses are full and equal. No bruits present. Neurologic: Alert and oriented x3. Deep tendon reflexes symmetrical and normal. Her gait is slightly unsteady, but Romberg and finger to nose testing is normal.          Skin: Intact without suspicious lesions or rashes. Lymph: No cervical, axillary  lymphadenopathy present. Psych: Mood and affect are normal. Normally interactive  Assessment & Plan:  #1 hypertension, marginally labile. It will be necessary to verify the validity of her cuff and to determine blood pressure averages.  #2 left temporal and retro-orbital headache with possible anisocoria. Clinically no evidence of temporal arteritis  #3 imbalance, probably multifactorial. It is related to degenerative joint disease and deconditioning. Plan: See orders and recommendations

## 2011-03-05 NOTE — Progress Notes (Signed)
Addended by: Edgardo Roys on: 03/05/2011 01:44 PM   Modules accepted: Orders

## 2011-03-09 ENCOUNTER — Telehealth: Payer: Self-pay | Admitting: Internal Medicine

## 2011-03-09 NOTE — Telephone Encounter (Signed)
Patient was referred  For physical therepy - she lives at the [facility] home & would like order to have pt there

## 2011-03-09 NOTE — Telephone Encounter (Signed)
Please take in consideration patient's request for Physical Therapy [facility])

## 2011-03-10 ENCOUNTER — Encounter: Payer: Self-pay | Admitting: Vascular Surgery

## 2011-03-10 NOTE — Telephone Encounter (Signed)
If PT needed in patient's place of residence, that will need to be a request to a Home Health provider.  I have no PT providers that go to patient's home.  Please process a request for Home Health for patient, thanks.

## 2011-03-11 ENCOUNTER — Other Ambulatory Visit (INDEPENDENT_AMBULATORY_CARE_PROVIDER_SITE_OTHER): Payer: Medicare Other | Admitting: *Deleted

## 2011-03-11 ENCOUNTER — Ambulatory Visit (INDEPENDENT_AMBULATORY_CARE_PROVIDER_SITE_OTHER): Payer: Medicare Other | Admitting: Vascular Surgery

## 2011-03-11 ENCOUNTER — Encounter: Payer: Self-pay | Admitting: Vascular Surgery

## 2011-03-11 VITALS — BP 145/85 | HR 65 | Resp 14 | Ht 64.0 in | Wt 132.0 lb

## 2011-03-11 DIAGNOSIS — I83893 Varicose veins of bilateral lower extremities with other complications: Secondary | ICD-10-CM

## 2011-03-11 NOTE — Telephone Encounter (Signed)
Called patient to further discuss, patient was not at home. I was instructed to contact patient tomorrow

## 2011-03-11 NOTE — Progress Notes (Signed)
[facility] [place]  Patient name: Shelly Ross MRN: [id number] DOB: [birth year] Sex: female  CC: evaluation of varicose veins  HPI: Shelly Ross is a 77 y.o. female who was referred by Dr. Alwyn Ren for evaluation of her bilateral lower extremity varicose veins. She states that she has had varicose veins in both lower trees for many years. She has significant pain and aching in her legs associated with her varicose veins. Her symptoms are aggravated by standing and sitting and relieved with elevation. He also complains of itching associated with her varicose veins. He is unaware of any previous history of DVT or phlebitis. She denies burning pain muscle cramps. She has had some bilateral lower extremity swelling.   Of note she did have a significant bleeding episode from varicosities at the dorsum of her right foot approximately 2-3 months ago. His had no further bleeding episodes.   Past Medical History  Diagnosis Date  . Chronic low back pain   . Premature ventricular contractions   . Dizziness   . Hypertension   . Neuralgia, neuritis, and radiculitis, unspecified   . Degenerative disc disease     CERVICAL SPINE,W/RADICULOPATHY  . GERD (gastroesophageal reflux disease)   . Hyperlipidemia   . Thyroid disease     HYPOTHYROIDISM  . Osteoporosis   . Rib fractures     Family History  Problem Relation Age of Onset  . Stroke Mother   . Heart disease Father     CAD  . Deep vein thrombosis Brother     AND PTE  . Cancer Other     AUNT INTESTINAL     SOCIAL HISTORY: History  Substance Use Topics  . Smoking status: Never Smoker   . Smokeless tobacco: Not on file  . Alcohol Use: Yes     wine, seldom     Allergies  Allergen Reactions  . Lansoprazole     ? intolerance    Current Outpatient Prescriptions  Medication Sig Dispense Refill  . acetaminophen (TYLENOL EX ST ARTHRITIS PAIN) 500 MG tablet Take 500 mg by mouth as needed.        . AMBULATORY NON  FORMULARY MEDICATION Omega Q Plus: 1 by mouth daily (Cardiovascular Health)       . Ascorbic Acid (VITAMIN C) 1000 MG tablet Take 1,000 mg by mouth daily.        Marland Kitchen aspirin 81 MG tablet Take 81 mg by mouth daily.        . calcium carbonate (OS-CAL) 600 MG TABS Take 600 mg by mouth 2 (two) times daily with a meal.        . Cholecalciferol (VITAMIN D3) 1000 UNITS CAPS Take 1,000 Units by mouth.        . Coenzyme Q10 (COQ10) 100 MG CAPS Take 100 mg by mouth daily.        . Glucosamine-Chondroit-Vit C-Mn (GLUCOSAMINE 1500 COMPLEX PO) Take 1,500 mg by mouth daily.        . hydrochlorothiazide 25 MG tablet 1/2 by mouth daily  45 tablet  2  . levothyroxine (SYNTHROID, LEVOTHROID) 75 MCG tablet 1/2 tab daily except 1 on Wed  48 tablet  2  . LORazepam (ATIVAN) 0.5 MG tablet Take 1 tablet (0.5 mg total) by mouth every 8 (eight) hours as needed for anxiety.  30 tablet  2  . Multiple Vitamin (MULTIVITAMIN) tablet Take 1 tablet by mouth daily.        . Multiple Vitamins-Minerals (OCUVITE PRESERVISION PO) Take  by mouth daily.        . Omega-3 Fatty Acids (FISH OIL) 1000 MG CAPS Take 1,000 mg by mouth. Off/on (3-7 x weekly)       . RABEprazole (ACIPHEX) 20 MG tablet Take 20 mg by mouth 2 (two) times daily.        . sertraline (ZOLOFT) 25 MG tablet Take 1 tablet (25 mg total) by mouth daily.  30 tablet  5  . spironolactone (ALDACTONE) 25 MG tablet 1 once daily IF BP AVERAGES > 135/85  30 tablet  0  . vitamin E 400 UNIT capsule Take 400 Units by mouth daily.          REVIEW OF SYSTEMS: Shelly Ross ] denotes positive finding; [  ] denotes negative finding CARDIOVASCULAR:  [ ]  chest pain   [ ]  chest pressure   [ ]  palpitations   [ ]  orthopnea   [ ]  dyspnea on exertion   [ ]  claudication   [ ]  rest pain   [ ]  DVT   [ ]  phlebitis PULMONARY:   [ ]  productive cough   [ ]  asthma   [ ]  wheezing NEUROLOGIC:   [ ]  weakness  [ ]  paresthesias  [ ]  aphasia  [ ]  amaurosis  [ ]  dizziness HEMATOLOGIC:   [ ]  bleeding problems   [ ]   clotting disorders MUSCULOSKELETAL:  Shelly Ross ] joint pain   [ ]  joint swelling GASTROINTESTINAL: [ ]   blood in stool  [ ]   hematemesis GENITOURINARY:  Shelly Ross ]  dysuria  [ ]   hematuria PSYCHIATRIC:  [ ]  history of major depression INTEGUMENTARY:  [ ]  rashes  [ ]  ulcers CONSTITUTIONAL:  [ ]  fever   [ ]  chills  PHYSICAL EXAM: Filed Vitals:   03/11/11 1450  BP: 145/85  Pulse: 65  Resp: 14  Height: 5\' 4"  (1.626 m)  Weight: 132 lb (59.875 kg)  SpO2: 98%   Body mass index is 22.66 kg/(m^2). GENERAL: The patient is a well-nourished female, in no acute distress. The vital signs are documented above. CARDIOVASCULAR: There is a regular rate and rhythm without significant murmur appreciated. I do not detect any carotid bruits. She has palpable femoral and popliteal pulses. I cannot palpate pedal pulses however both feet are warm and well-perfused. She has mild bilateral lower extremity swelling. She has spider veins until indications bilaterally with some larger truncal varicosities. She has some very large varicosities in her popliteal fossa on the right and over the dorsum of her foot on the left. He has no evidence of active phlebitis. PULMONARY: There is good air exchange bilaterally without wheezing or rales. ABDOMEN: Soft and non-tender with normal pitched bowel sounds.  MUSCULOSKELETAL: There are no major deformities or cyanosis. NEUROLOGIC: No focal weakness or paresthesias are detected. SKIN: She has some hyperpigmentation bilaterally.Marland Kitchen PSYCHIATRIC: The patient has a normal affect.  DATA:  I have independently interpreted her venous duplex scan today. She has evidence of reflux in bilateral common femoral veins and also both greater saphenous veins. In addition she has reflux in her right small saphenous vein. She has no evidence of DVT on in either lower tree.  I've also reviewed her records from Dr. Frederik Pear office. She has been followed with a slight lymphocytosis. His had some problems with  hypotension and is off her hydrochlorothiazide.  MEDICAL ISSUES: The patient feels it hurts varicose veins are quite bothersome and she has asked significant pain and aching in her legs associated with her varicosities.  I've discussed with her the importance of intermittent leg elevation and the proper positioning for this. I've also written a prescription for thigh high compression stockings with a 20-30 mm mercury pressure gradient and she'll be fitted for these today. His also instructed on the use of ibuprofen for pain if needed.  Arrange for a followup visit with Dr. Hart Rochester in 3 months to evaluate her varicose veins. He could potentially be considered for laser ablation of her greater saphenous veins if her symptoms persist despite aggressive conservative treatment. She knows to call sooner if she has problems.  Shelly Ross S [facility] [place] Office: [phone]

## 2011-03-13 NOTE — Telephone Encounter (Signed)
I called patient to see if she had a number for the [facility] for Physical Therapy , patient gave me the number [phone].  I called and left message for the [facility] to contact me with a fax number and inform me of the information needed to get this set up for the patient of if there is a form for [place] to complete I gave the fax number.

## 2011-03-13 NOTE — Telephone Encounter (Signed)
Gregary Signs called back, left message stating to send order to [phone] and they will evaluate the patient and send a write up back.  Order printed and sent back

## 2011-03-18 NOTE — Procedures (Unsigned)
DUPLEX DEEP VENOUS EXAM - LOWER EXTREMITY  INDICATION:  Varicose veins.  HISTORY:  Edema:  No. Trauma/Surgery:  No. Pain:  Mostly right knee pain. PE:  No. Previous DVT:  No. Anticoagulants: Other:  DUPLEX EXAM:               CFV   SFV   PopV  PTV    GSV               R  L  R  L  R  L  R   L  R  L Thrombosis    o  o  o  o  o  o  o   o  o  o Spontaneous   +  +  +  +  +  +  +   +  +  + Phasic        +  +  +  +  +  +  +   +  +  + Augmentation  +  +  +  +  +  +  +   +  +  + Compressible  +  +  +  +  +  +  +   +  +  + Competent     o  o  +  +  +  +  +   +  o  o  Legend:  + - yes  o - no  p - partial  D - decreased  IMPRESSION: 1. No evidence of deep venous thrombosis noted in the bilateral lower     extremities. 2. Reflux of >500 milliseconds noted in the bilateral common femoral,     great saphenous, and right small saphenous veins.   _____________________________ Di Kindle. Edilia Bo, M.D.  CH/MEDQ  D:  03/11/2011  T:  03/11/2011  Job:  [id number]

## 2011-03-24 ENCOUNTER — Ambulatory Visit
Admission: RE | Admit: 2011-03-24 | Discharge: 2011-03-24 | Disposition: A | Discharge status: Home / Self Care | Payer: Medicare Other | Source: Ambulatory Visit | Attending: Chiropractic Medicine | Admitting: Chiropractic Medicine

## 2011-03-24 ENCOUNTER — Other Ambulatory Visit: Payer: Self-pay | Admitting: Chiropractic Medicine

## 2011-03-24 DIAGNOSIS — M545 Low back pain, unspecified: Secondary | ICD-10-CM

## 2011-03-25 ENCOUNTER — Ambulatory Visit (INDEPENDENT_AMBULATORY_CARE_PROVIDER_SITE_OTHER): Payer: Medicare Other | Admitting: Internal Medicine

## 2011-03-25 ENCOUNTER — Encounter: Payer: Self-pay | Admitting: Internal Medicine

## 2011-03-25 VITALS — BP 92/58 | HR 61 | Temp 98.2°F | Resp 14 | Wt 135.0 lb

## 2011-03-25 DIAGNOSIS — R55 Syncope and collapse: Secondary | ICD-10-CM

## 2011-03-25 DIAGNOSIS — E039 Hypothyroidism, unspecified: Secondary | ICD-10-CM

## 2011-03-25 DIAGNOSIS — I10 Essential (primary) hypertension: Secondary | ICD-10-CM

## 2011-03-25 NOTE — Patient Instructions (Addendum)
Please complete stool cards . Blood Pressure Goal  Ideally is an AVERAGE < 135/85. This AVERAGE should be calculated from @ least 5-7 BP readings taken @ different times of day on different days of week. You should not respond to isolated BP readings , but rather the AVERAGE for that week

## 2011-03-25 NOTE — Progress Notes (Signed)
Subjective:    Patient ID: Shelly Ross, female    DOB: [birth year], 77 y.o.   MRN: [id number]  HPI  HYPERTENSION: Disease Monitoring  Blood pressure range: 86/50-155/88  Chest pain: no   Dyspnea: no   Claudication: no   Medication compliance: no, BP meds D/Ced when she experienced back pain  Medication Side Effects  Lightheadedness: yes, near syncope 10/23 with BP of 88/50 @ 7 pm   Urinary frequency: no   Edema: no     Preventitive Healthcare:  Exercise: no   Diet Pattern: no plan ; she takes multiple supplements  Salt Restriction: yes        Review of Systems Back pain Location:R hip to ankle Onset:10/21 upon awakening Quality:sharp Duration:constant all day Severity:up to 8 Trigger/injury:? Low BP; meds D/Ced Exacerbating factors : standing Relievers:rest Treatment/response: Associated signs and symptoms: Blurred/double/loss of vision:blurring 10/23 Fever/chills/sweats/weight loss:no Abdominal pain/melena/rectal bleeding/bowel change:no Dysuria/hematuria/pyuria: seen by Dr Vernie Ammons for dysuria Rash/change in temperature or color:no Weakness/gait dysfunction/ tingling/stool or urine incontinence:no. Some numbness in RLE Bruising/bleeding/lymphadenopathy:no          Objective:   Physical Exam Gen.: Then but well-nourished in appearance. Alert, appropriate and cooperative throughout exam. Head: Normocephalic without obvious abnormalities Eyes: No corneal or conjunctival inflammation noted. Pupils equal round reactive to light and accommodation.  Extraocular motion intact.   Neck: No deformities, masses, or tenderness noted. Range of motion &. Thyroid  normal. Lungs: Normal respiratory effort; chest expands symmetrically. Lungs are clear to auscultation without rales, wheezes, or increased work of breathing. Heart: Normal rate and rhythm. Normal S1 and S2. No gallop, click, or rub. S 4 murmur. Abdomen: Bowel sounds normal; abdomen soft and nontender. No  masses, organomegaly or hernias noted.                                                                                Musculoskeletal/extremities: No deformity or scoliosis noted of  the thoracic or lumbar spine. No clubbing, cyanosis, edema, or deformity noted. Range of motion  normal .Tone & strength  normal.Joints : minor DIP OA changes. Nail health  good. She is able to lie back on the exam table and sit up without help. Straight leg raising is negative. Vascular: Carotid, radial artery, dorsalis pedis and  posterior tibial pulses are full and equal. No bruits present. Neurologic: Alert and oriented x3. Deep tendon reflexes symmetrical and normal.          Skin: Intact without suspicious lesions or rashes. Minor isolated bruising Lymph: No cervical, axillary lymphadenopathy present. Psych: Mood and affect are normal. Normally interactive                                                                                           Assessment & Plan:  #1 hypertension resolved  #2 relative hypotension without  definitive cause  #3 right lower extremity pain & numbness suggestive of positionally related L4-5  radiculopathy.  #4 near syncope with the hypotension  Plan: #1 EKG. This is normal; there is low voltage in the precordial leads due to breast tissue. There is no sign of ischemia or dysrhythmia.   #2 Her recent labs have been normal but in view of these events and will be rechecked.   #3 Blood pressure medicines will be held.

## 2011-03-26 LAB — CBC WITH DIFFERENTIAL/PLATELET
Basophils Relative: 0.3 % (ref 0.0–3.0)
Eosinophils Absolute: 0.5 10*3/uL (ref 0.0–0.7)
HCT: 40.7 % (ref 36.0–46.0)
Hemoglobin: 13.5 g/dL (ref 12.0–15.0)
MCHC: 33.2 g/dL (ref 30.0–36.0)
MCV: 93.5 fl (ref 78.0–100.0)
Monocytes Absolute: 0.9 10*3/uL (ref 0.1–1.0)
Neutro Abs: 3.1 10*3/uL (ref 1.4–7.7)
Neutrophils Relative %: 39.7 % — ABNORMAL LOW (ref 43.0–77.0)
RBC: 4.35 Mil/uL (ref 3.87–5.11)
RDW: 13.1 % (ref 11.5–14.6)

## 2011-03-26 LAB — BASIC METABOLIC PANEL
CO2: 31 mEq/L (ref 19–32)
Chloride: 103 mEq/L (ref 96–112)
Creatinine, Ser: 0.9 mg/dL (ref 0.4–1.2)
Glucose, Bld: 101 mg/dL — ABNORMAL HIGH (ref 70–99)

## 2011-03-27 ENCOUNTER — Other Ambulatory Visit: Payer: Medicare Other

## 2011-03-30 ENCOUNTER — Ambulatory Visit
Admission: RE | Admit: 2011-03-30 | Discharge: 2011-03-30 | Disposition: A | Discharge status: Home / Self Care | Payer: Medicare Other | Source: Ambulatory Visit | Attending: Internal Medicine | Admitting: Internal Medicine

## 2011-03-30 DIAGNOSIS — R55 Syncope and collapse: Secondary | ICD-10-CM

## 2011-04-03 ENCOUNTER — Ambulatory Visit (INDEPENDENT_AMBULATORY_CARE_PROVIDER_SITE_OTHER): Payer: No Typology Code available for payment source | Admitting: Internal Medicine

## 2011-04-03 ENCOUNTER — Encounter: Payer: Self-pay | Admitting: Internal Medicine

## 2011-04-03 VITALS — BP 116/82 | HR 68 | Temp 97.5°F | Resp 14 | Wt 133.2 lb

## 2011-04-03 DIAGNOSIS — R0989 Other specified symptoms and signs involving the circulatory and respiratory systems: Secondary | ICD-10-CM

## 2011-04-03 DIAGNOSIS — I10 Essential (primary) hypertension: Secondary | ICD-10-CM

## 2011-04-03 NOTE — Patient Instructions (Addendum)
Blood Pressure Goal  Ideally is an AVERAGE < 135/85. This AVERAGE should be calculated from @ least 5-7 BP readings taken @ different times of day on different days of week. You should not respond to isolated BP readings , but rather the AVERAGE for that week .  If you have a acute blood pressure rise, take the Ativan ( lorazepam) every 8 hours as needed. If you blood pressure does rise; please collect all urine for 24 hours.

## 2011-04-03 NOTE — Progress Notes (Signed)
  Subjective:    Patient ID: Shelly Ross, female    DOB: [birth year], 77 y.o.   MRN: [id number]  HPI Blood pressure is has ranged from a low of 123/66 to a high of 181/106. Her calculated average has been 153/86 since last visit.  She cannot identify any specific trigger for the isolated elevation of blood pressure. It has been 160 or higher on 3 occasions. She denies excess salt intake or ingestion of stimulants. She does admit that she is extremely worried about her husband's health issues. He is getting better at this time.  She does have intermittent headache and flushing. She denies associated chest pain and diarrhea.  Labs were reviewed. Renal  function has been stable over the last 8 months. TSH is therapeutic.        Review of Systems     Objective:   Physical Exam   She has improved affect today; she is alert and communicative  Chest is clear with no rales or increased work of breathing.  She is a slow S4 but no significant murmur.  Pulses are intact without deficit.        Assessment & Plan:  #1 hypertension with marked variability. The overall average is not alarming. She does have intermittent headache and flushing. 24-hour urines for catecholamines and metanephrines will be checked.  I've asked her to keep a diary to see if she can identify any triggers for the rapid rise in blood pressures.

## 2011-04-15 ENCOUNTER — Other Ambulatory Visit (INDEPENDENT_AMBULATORY_CARE_PROVIDER_SITE_OTHER): Payer: Medicare Other

## 2011-04-15 DIAGNOSIS — Z1211 Encounter for screening for malignant neoplasm of colon: Secondary | ICD-10-CM

## 2011-04-15 DIAGNOSIS — R51 Headache: Secondary | ICD-10-CM

## 2011-04-15 LAB — HEMOCCULT GUIAC POC 1CARD (OFFICE): Card #3 Fecal Occult Blood, POC: NEGATIVE

## 2011-04-15 NOTE — Progress Notes (Signed)
12  

## 2011-05-05 NOTE — Progress Notes (Signed)
Addended by: Legrand Como on: 05/05/2011 02:49 PM   Modules accepted: Orders

## 2011-05-09 LAB — METANEPHRINES, URINE, 24 HOUR
Metaneph Total, Ur: 419 mcg/24 h (ref 224–832)
Normetanephrine, 24H Ur: 292 mcg/24 h (ref 122–676)

## 2011-06-05 ENCOUNTER — Telehealth: Payer: Self-pay | Admitting: Internal Medicine

## 2011-06-05 MED ORDER — LEVOTHYROXINE SODIUM 75 MCG PO TABS: ORAL_TABLET | ORAL | Status: DC

## 2011-06-05 MED ORDER — ATORVASTATIN CALCIUM 20 MG PO TABS: 20.0000 mg | ORAL_TABLET | ORAL | Status: DC

## 2011-06-05 MED ORDER — HYDROCHLOROTHIAZIDE 25 MG PO TABS: ORAL_TABLET | ORAL | Status: DC

## 2011-06-05 NOTE — Telephone Encounter (Signed)
Patient would like refills sent to [facility] [phone] of synthroid, hydrocholrophiazid, atorvastatin

## 2011-06-05 NOTE — Telephone Encounter (Signed)
RXs sent.

## 2011-06-12 ENCOUNTER — Encounter: Payer: Self-pay | Admitting: Vascular Surgery

## 2011-06-15 ENCOUNTER — Ambulatory Visit (INDEPENDENT_AMBULATORY_CARE_PROVIDER_SITE_OTHER): Payer: Medicare Other | Admitting: Vascular Surgery

## 2011-06-15 ENCOUNTER — Encounter: Payer: Self-pay | Admitting: Vascular Surgery

## 2011-06-15 VITALS — BP 138/70 | HR 86 | Resp 18 | Ht 65.0 in | Wt 135.0 lb

## 2011-06-15 DIAGNOSIS — I83893 Varicose veins of bilateral lower extremities with other complications: Secondary | ICD-10-CM

## 2011-06-15 NOTE — Progress Notes (Signed)
Subjective:     Patient ID: Shelly Ross, female   DOB: 1924-11-08, 78 y.o.   MRN: [id number]  HPI this 78 year old female returns today for further evaluation for venous insufficiency of both legs right worse than left. She continues to have aching throbbing and burning discomfort in the right thigh and calf particularly in the right leg despite wearing long leg elastic compression stockings (20-30 mm gradient). He has had one episode of bleeding in the right ankle from the bulging varicosities which occurred about 6 months ago. She has no history of DVT or thrombophlebitis. She has significant symptoms in both lower extremities which are relieved by elevation to some degree but not helped with pain medication or elastic compression. He had a venous duplex exam performed in October which Dr. Durwin Nora reviewed and interpreted. This revealed gross reflux in both great saphenous systems and in the right small saphenous system.  Past Medical History  Diagnosis Date  . Chronic low back pain   . Premature ventricular contractions   . Dizziness   . Hypertension   . Neuralgia, neuritis, and radiculitis, unspecified   . Degenerative disc disease     CERVICAL SPINE,W/RADICULOPATHY  . GERD (gastroesophageal reflux disease)   . Hyperlipidemia   . Thyroid disease     HYPOTHYROIDISM  . Osteoporosis   . Rib fractures     History  Substance Use Topics  . Smoking status: Never Smoker   . Smokeless tobacco: Never Used  . Alcohol Use: Yes     wine, seldom     Family History  Problem Relation Age of Onset  . Stroke Mother   . Heart disease Father     CAD  . Deep vein thrombosis Brother     AND PTE  . Cancer Other     AUNT INTESTINAL     Allergies  Allergen Reactions  . Lansoprazole     ? intolerance    Current outpatient prescriptions:acetaminophen (TYLENOL EX ST ARTHRITIS PAIN) 500 MG tablet, Take 500 mg by mouth as needed.  , Disp: , Rfl: ;  Ascorbic Acid (VITAMIN C) 1000 MG tablet, Take  1,000 mg by mouth daily.  , Disp: , Rfl: ;  aspirin 81 MG tablet, Take 81 mg by mouth daily.  , Disp: , Rfl: ;  atorvastatin (LIPITOR) 20 MG tablet, Take 1 tablet (20 mg total) by mouth every other day., Disp: 45 tablet, Rfl: 0 calcium carbonate (OS-CAL) 600 MG TABS, Take 600 mg by mouth 2 (two) times daily with a meal.  , Disp: , Rfl: ;  Cholecalciferol (VITAMIN D3) 1000 UNITS CAPS, Take 1,000 Units by mouth.  , Disp: , Rfl: ;  Glucosamine-Chondroit-Vit C-Mn (GLUCOSAMINE 1500 COMPLEX PO), Take 1,500 mg by mouth daily.  , Disp: , Rfl: ;  hydrochlorothiazide (HYDRODIURIL) 25 MG tablet, 1/2 by mouth daily, Disp: 45 tablet, Rfl: 2 levothyroxine (SYNTHROID, LEVOTHROID) 75 MCG tablet, 1/2 tab daily except 1 on Wed, Disp: 48 tablet, Rfl: 2;  LORazepam (ATIVAN) 0.5 MG tablet, Take 1 tablet (0.5 mg total) by mouth every 8 (eight) hours as needed for anxiety., Disp: 30 tablet, Rfl: 2;  Multiple Vitamin (MULTIVITAMIN) tablet, Take 1 tablet by mouth daily.  , Disp: , Rfl: ;  Multiple Vitamins-Minerals (OCUVITE PRESERVISION PO), Take by mouth daily.  , Disp: , Rfl:  RABEprazole (ACIPHEX) 20 MG tablet, Take 20 mg by mouth 2 (two) times daily.  , Disp: , Rfl: ;  vitamin E 400 UNIT capsule, Take 400 Units by  mouth daily.  , Disp: , Rfl: ;  AMBULATORY NON FORMULARY MEDICATION, Omega Q Plus: 1 by mouth daily (Cardiovascular Health) , Disp: , Rfl: ;  Coenzyme Q10 (COQ10) 100 MG CAPS, Take 100 mg by mouth daily.  , Disp: , Rfl:  Omega-3 Fatty Acids (FISH OIL) 1000 MG CAPS, Take 1,000 mg by mouth. Off/on (3-7 x weekly) , Disp: , Rfl: ;  sertraline (ZOLOFT) 25 MG tablet, Take 1 tablet (25 mg total) by mouth daily., Disp: 30 tablet, Rfl: 5  BP 138/70  Pulse 86  Resp 18  Ht 5\' 5"  (1.651 m)  Wt 135 lb (61.236 kg)  BMI 22.47 kg/m2  Body mass index is 22.47 kg/(m^2).        Review of Systems he does have right knee problems due to degenerative joint disease. She denies any chest pain, dyspnea on exertion, PND, orthopnea.  Does have esophageal reflux at times.     Objective:   Physical Exam blood pressure 130/70 heart rate 86 respirations 18 General well-developed well-nourished elderly female no apparent stress alert and oriented x3 Lungs no rhonchi or wheezing Lower extremity exam reveals right leg has bulging varicosities in the distal thigh which extended up popliteal fossa down into the proximal calf. There is evidence of previous bleeding site in the right ankle where an eschar is located over superficial vein but there is no evidence of infection or active bleeding. No ulcerations are noted. She has 1+ edema bilaterally.  Today I imaged both great saphenous systems in the right small saphenous system with the SonoSite ultrasound device. She does have reflux in both great saphenous systems and the right small saphenous system.    Assessment:     Venous insufficiency both legs with history of bleeding right ankle and pain in both lower extremities which are affecting her daily living. These were not improved with conservative measures    Plan:     I feel patient should have the following procedures #1 laser ablation right great saphenous vein #2 laser ablation right small saphenous vein #3 laser ablation left great saphenous vein #4 possible stab phlebectomy is in 3 months of right posterior calf and distal thigh if they do not decompress with laser ablation

## 2011-07-01 ENCOUNTER — Other Ambulatory Visit: Payer: Self-pay | Admitting: *Deleted

## 2011-07-01 DIAGNOSIS — I83899 Varicose veins of unspecified lower extremities with other complications: Secondary | ICD-10-CM

## 2011-07-10 ENCOUNTER — Encounter: Payer: Self-pay | Admitting: Vascular Surgery

## 2011-07-13 ENCOUNTER — Ambulatory Visit (INDEPENDENT_AMBULATORY_CARE_PROVIDER_SITE_OTHER): Payer: Medicare Other | Admitting: Vascular Surgery

## 2011-07-13 ENCOUNTER — Encounter: Payer: Self-pay | Admitting: Vascular Surgery

## 2011-07-13 VITALS — BP 180/80 | HR 66 | Resp 16 | Ht 65.0 in | Wt 132.0 lb

## 2011-07-13 DIAGNOSIS — I83893 Varicose veins of bilateral lower extremities with other complications: Secondary | ICD-10-CM

## 2011-07-13 HISTORY — PX: ENDOVENOUS ABLATION SAPHENOUS VEIN W/ LASER: SUR449

## 2011-07-13 NOTE — Progress Notes (Signed)
Laser Ablation Procedure      Date: 07/13/2011    Shelly Ross DOB:21-Apr-1925  Consent signed: Yes  Surgeon:J.D. Hart Rochester  Procedure: Laser Ablation: right Greater Saphenous Vein  BP 180/80  Pulse 66  Resp 16  Ht 5\' 5"  (1.651 m)  Wt 132 lb (59.875 kg)  BMI 21.97 kg/m2  Start time: 1:00   End time: 1:40  Tumescent Anesthesia: 200 cc 0.9% NaCl with 50 cc Lidocaine HCL with 1% Epi and 15 cc 8.4% NaHCO3  Local Anesthesia: 4 cc Lidocaine HCL and NaHCO3 (ratio 2:1)  Pulsed mode: Watts 15 Seconds 1 Pulses:1 Total Pulses:95 Total Energy: 1400 Total Time: 1:33   Patient tolerated procedure well: Yes  Description of Procedure:  After marking the course of the saphenous vein and the secondary varicosities in the standing position, the patient was placed on the operating table in the supine position, and the right leg was prepped and draped in sterile fashion. Local anesthetic was administered, and under ultrasound guidance the saphenous vein was accessed with a micro needle and guide wire; then the micro puncture sheath was placed. A guide wire was inserted to the saphenofemoral junction, followed by a 5 french sheath.  The position of the sheath and then the laser fiber below the junction was confirmed using the ultrasound and visualization of the aiming beam.  Tumescent anesthesia was administered along the course of the saphenous vein using ultrasound guidance. Protective laser glasses were placed on the patient, and the laser was fired at 15 watt pulsed mode advancing 1-2 mm per sec.  For a total of 1400 joules.  A steri strip was applied to the puncture site.  ABD pads and thigh high compression stockings were applied.  Ace wrap bandages were applied over the phlebectomy sites and at the top of the saphenofemoral junction.  Blood loss was less than 15 cc.  The patient ambulated out of the operating room having tolerated the procedure well.

## 2011-07-13 NOTE — Progress Notes (Signed)
Subjective:     Patient ID: Shelly Ross, female   DOB: 1925-05-17, 78 y.o.   MRN: [id number]  HPI this 78 year old female underwent laser ablation right great saphenous vein under local tumescent anesthesia today. She tolerated the procedure well. A total of 1400 J of energy was utilized. She has a history of bleeding from a varix in the right leg and has venous hypertension secondary to valvular reflux in the right great and right small saphenous veins as well as the left great saphenous vein.   Review of Systems     Objective:   Physical ExamGen.-alert and oriented x3 in no apparent distress HEENT normal for age Lungs no rhonchi or wheezing Cardiovascular regular rhythm no murmurs carotid pulses 3+ palpable no bruits audible Abdomen soft nontender no palpable masses Musculoskeletal free of  major deformities Skin clear -no rashes Neurologic normal Lower extremities 3+ femoral and dorsalis pedis pulses palpable bilaterally with no edema     Assessment:    well-tolerated laser ablation right great saphenous vein under local tumescent anesthesia    Plan:     Return in one week for a venous duplex exam to confirm closure right great saphenous vein. Patient will then be scheduled for right small saphenous vein laser ablation in the near future

## 2011-07-14 ENCOUNTER — Telehealth: Payer: Self-pay | Admitting: *Deleted

## 2011-07-14 NOTE — Telephone Encounter (Signed)
07/14/2011  Time: 9:39 AM   Patient Name: Shelly Ross  Patient of: J.D. Hart Rochester  Procedure:Laser Ablation right  Left a message at patient's jhome checking  Her status  Yes    Comments/Actions Taken: left a voice mail for her to call me back.    @SIGNATURE @

## 2011-07-15 ENCOUNTER — Encounter: Payer: Self-pay | Admitting: Vascular Surgery

## 2011-07-20 ENCOUNTER — Encounter: Payer: Self-pay | Admitting: Vascular Surgery

## 2011-07-21 ENCOUNTER — Encounter (INDEPENDENT_AMBULATORY_CARE_PROVIDER_SITE_OTHER): Payer: Medicare Other | Admitting: *Deleted

## 2011-07-21 ENCOUNTER — Encounter: Payer: Self-pay | Admitting: Vascular Surgery

## 2011-07-21 ENCOUNTER — Other Ambulatory Visit: Payer: Self-pay | Admitting: *Deleted

## 2011-07-21 ENCOUNTER — Ambulatory Visit (INDEPENDENT_AMBULATORY_CARE_PROVIDER_SITE_OTHER): Payer: Medicare Other | Admitting: Vascular Surgery

## 2011-07-21 VITALS — BP 155/80 | HR 62 | Resp 16 | Ht 64.0 in | Wt 132.9 lb

## 2011-07-21 DIAGNOSIS — I83893 Varicose veins of bilateral lower extremities with other complications: Secondary | ICD-10-CM

## 2011-07-21 NOTE — Progress Notes (Signed)
Patient presents today for followup of right great saphenous vein ablation by Dr. Hart Rochester on 07/13/2011. She has mild bruising over the medial thigh been minimal discomfort associated with this.  She underwent a venous duplex in our office and this does show closure of her great saphenous vein throughout its course with no evidence of DVT.  Impression and plan successful treatment of right great saphenous vein incompetence. She will return to see Dr. Hart Rochester for right small saphenous vein treatment at her convenience

## 2011-07-23 ENCOUNTER — Ambulatory Visit: Payer: Medicare Other | Admitting: Vascular Surgery

## 2011-07-24 ENCOUNTER — Telehealth: Payer: Self-pay | Admitting: *Deleted

## 2011-07-24 NOTE — Telephone Encounter (Signed)
Left message to call office

## 2011-07-24 NOTE — Telephone Encounter (Signed)
Pt c/o headache, dizziness, and elevated BP. Pt states that BP average is 148/86 and this was calculated from BP reading for the pass 5 days. Pt denies any blurred vision, chest pain, SOB. Marland KitchenPlease advise

## 2011-07-24 NOTE — Telephone Encounter (Signed)
Losartan 50 mg; one half pill daily, dispense 30. Followup in 3 weeks with blood pressure recordings. Blood Pressure Goal  Ideally is an AVERAGE < 135/85. This AVERAGE should be calculated from @ least 5-7 BP readings taken @ different times of day on different days of week. You should not respond to isolated BP readings , but rather the AVERAGE for that week

## 2011-07-27 MED ORDER — LOSARTAN POTASSIUM 50 MG PO TABS: 25.0000 mg | ORAL_TABLET | Freq: Every day | ORAL | Status: DC

## 2011-07-27 NOTE — Procedures (Unsigned)
DUPLEX DEEP VENOUS EXAM - LOWER EXTREMITY  INDICATION:  Right greater saphenous vein laser ablation.  HISTORY:  Edema:  No. Trauma/Surgery:  Right greater saphenous vein laser ablation on 07/13/2011. Pain:  Yes. PE:  No. Previous DVT:  No. Anticoagulants: Other:  DUPLEX EXAM:               CFV   SFV   PopV  PTV    GSV               R  L  R  L  R  L  R   L  R  L Thrombosis    o  o  o     o     o      + Spontaneous   +  +  +     +     +      D Phasic        +  +  +     +     +      D Augmentation  +  +  +     +     +      D Compressible  +  +  +     +     +      o Competent     o  o  o     +     +      o  Legend:  + - yes  o - no  p - partial  D - decreased  IMPRESSION: 1. No evidence of deep venous thrombosis noted in the right lower     extremity. 2. The right great saphenous vein appears totally occluded from the     distal thigh to proximal thigh.  The proximal thigh/groin region of     the right great saphenous vein is patent with reflux of >500     milliseconds. 3. Reflux of >500 milliseconds noted in the bilateral common femoral     and right superficial femoral veins.   _____________________________ Quita Skye Hart Rochester, M.D.  CH/MEDQ  D:  07/23/2011  T:  07/23/2011  Job:  [id number]

## 2011-07-27 NOTE — Telephone Encounter (Signed)
Discuss with patient, Rx sent. 

## 2011-07-27 NOTE — Telephone Encounter (Signed)
Left message to call office

## 2011-08-10 ENCOUNTER — Ambulatory Visit (INDEPENDENT_AMBULATORY_CARE_PROVIDER_SITE_OTHER): Payer: Medicare Other | Admitting: Family Medicine

## 2011-08-10 ENCOUNTER — Encounter: Payer: Self-pay | Admitting: Family Medicine

## 2011-08-10 VITALS — BP 132/80 | HR 72 | Temp 98.1°F | Ht 62.75 in | Wt 133.2 lb

## 2011-08-10 DIAGNOSIS — I10 Essential (primary) hypertension: Secondary | ICD-10-CM

## 2011-08-10 NOTE — Progress Notes (Signed)
  Subjective:    Patient ID: Shelly Ross, female    DOB: [birth year], 78 y.o.   MRN: [id number]  HPI HTN- BPs are labile.  Ranging from 72-189/50-105.  Pt and daughter are not sure how she is taking medication.  Is not taking Cozaar regularly, is taking it at night.  Will skip Cozaar when taking spironolactone.  Reports BP dropped low 2 days and she became dizzy.  Also taking HCTZ.  Denies CP, SOB, HAs, edema.   Review of Systems For ROS see HPI     Objective:   Physical Exam  Vitals reviewed. Constitutional: She is oriented to person, place, and time. She appears well-developed and well-nourished. No distress.  HENT:  Head: Normocephalic and atraumatic.  Eyes: Conjunctivae and EOM are normal. Pupils are equal, round, and reactive to light.  Neck: Normal range of motion. Neck supple. No thyromegaly present.  Cardiovascular: Normal rate, regular rhythm, normal heart sounds and intact distal pulses.   No murmur heard. Pulmonary/Chest: Effort normal and breath sounds normal. No respiratory distress.  Abdominal: Soft. She exhibits no distension. There is no tenderness.  Musculoskeletal: She exhibits no edema.  Lymphadenopathy:    She has no cervical adenopathy.  Neurological: She is alert and oriented to person, place, and time.  Skin: Skin is warm and dry.  Psychiatric: She has a normal mood and affect. Her behavior is normal.          Assessment & Plan:

## 2011-08-10 NOTE — Patient Instructions (Signed)
Follow up w/ Dr Alwyn Ren in 3-4 weeks Take the Losartan EVERY DAY with BREAKFAST Take the hydrochlorothiazide EVERY DAY with BREAKFAST Check your blood pressure 2 hrs after taking your meds- if still high, take the Aldactone Take the thyroid medicine 30-45 minutes before breakfast Take the Lipitor (Atorvastatin) at night before bed Remember, it's better for the blood pressure to run a little high than have it drop low Call with any questions or concerns Hang in there!

## 2011-08-10 NOTE — Assessment & Plan Note (Signed)
Chronic problem.  Pt's readings are very labile.  Will not adjust meds b/c of pt's recorded low BPs- more dangerous for her to run low than slightly high.  Asymptomatic.  Reviewed each medication that she brought and instructed her on appropriate way and time to take med.  Pt and daughter appreciative.  Pt to f/u w/ PCP for BP recheck.

## 2011-08-14 ENCOUNTER — Encounter: Payer: Self-pay | Admitting: Vascular Surgery

## 2011-08-17 ENCOUNTER — Encounter: Payer: Self-pay | Admitting: Vascular Surgery

## 2011-08-17 ENCOUNTER — Ambulatory Visit (INDEPENDENT_AMBULATORY_CARE_PROVIDER_SITE_OTHER): Payer: Medicare Other | Admitting: Vascular Surgery

## 2011-08-17 VITALS — BP 118/78 | HR 66 | Resp 16 | Ht 65.0 in | Wt 132.0 lb

## 2011-08-17 DIAGNOSIS — I83893 Varicose veins of bilateral lower extremities with other complications: Secondary | ICD-10-CM

## 2011-08-17 NOTE — Progress Notes (Signed)
Laser Ablation Procedure      Date: 08/17/2011    Shelly Ross DOB:1924-10-02  Consent signed: Yes  Surgeon:J.D. Hart Rochester  Procedure: Laser Ablation: right Small Saphenous Vein  BP 118/78  Pulse 66  Resp 16  Ht 5\' 5"  (1.651 m)  Wt 132 lb (59.875 kg)  BMI 21.97 kg/m2  Start time: 1:00   End time: 1:30  Tumescent Anesthesia: 100 cc 0.9% NaCl with 50 cc Lidocaine HCL with 1% Epi and 15 cc 8.4% NaHCO3  Local Anesthesia: 3 cc Lidocaine HCL and NaHCO3 (ratio 2:1)  Pulsed mode: Watts 15 Seconds 1 Pulses:1 Total Pulses:47 Total Energy: 705 Total Time: :29   Patient tolerated procedure well: Yes  Description of Procedure:  After marking the course of the saphenous vein and the secondary varicosities in the standing position, the patient was placed on the operating table in the prone position, and the right leg was prepped and draped in sterile fashion. Local anesthetic was administered, and under ultrasound guidance the saphenous vein was accessed with a micro needle and guide wire; then the micro puncture sheath was placed. A guide wire was inserted to the saphenopopliteal junction, followed by a 5 french sheath.  The position of the sheath and then the laser fiber below the junction was confirmed using the ultrasound and visualization of the aiming beam.  Tumescent anesthesia was administered along the course of the saphenous vein using ultrasound guidance. Protective laser glasses were placed on the patient, and the laser was fired at 15 watt pulsed mode advancing 1-2 mm per sec.  For a total of 705 joules.  A steri strip was applied to the puncture site.  ABD pads and thigh high compression stockings were applied.  Ace wrap bandages were applied over the phlebectomy sites and at the top of the saphenopopliteal junction.  Blood loss was less than 15 cc.  The patient ambulated out of the operating room having tolerated the procedure well.

## 2011-08-17 NOTE — Progress Notes (Signed)
Subjective:     Patient ID: Shelly Ross, female   DOB: 1924-12-04, 78 y.o.   MRN: [id number]  HPI this 78 year old female had laser ablation of the right small saphenous vein performed under local tumescent anesthesia. She tolerated the procedure well. She previously had closure of the right great saphenous vein. 705 J of energy was utilized for the procedure today.  Past Medical History  Diagnosis Date  . Chronic low back pain   . Premature ventricular contractions   . Dizziness   . Hypertension   . Neuralgia, neuritis, and radiculitis, unspecified   . Degenerative disc disease     CERVICAL SPINE,W/RADICULOPATHY  . GERD (gastroesophageal reflux disease)   . Hyperlipidemia   . Thyroid disease     HYPOTHYROIDISM  . Osteoporosis   . Rib fractures     History  Substance Use Topics  . Smoking status: Never Smoker   . Smokeless tobacco: Never Used  . Alcohol Use: Yes     wine, seldom     Family History  Problem Relation Age of Onset  . Stroke Mother   . Heart disease Father     CAD  . Deep vein thrombosis Brother     AND PTE  . Cancer Other     AUNT INTESTINAL     Allergies  Allergen Reactions  . Lansoprazole     ? intolerance    Current outpatient prescriptions:acetaminophen (TYLENOL EX ST ARTHRITIS PAIN) 500 MG tablet, Take 500 mg by mouth as needed.  , Disp: , Rfl: ;  AMBULATORY NON FORMULARY MEDICATION, Omega Q Plus: 1 by mouth daily (Cardiovascular Health) , Disp: , Rfl: ;  Ascorbic Acid (VITAMIN C) 1000 MG tablet, Take 1,000 mg by mouth daily.  , Disp: , Rfl: ;  aspirin 81 MG tablet, Take 81 mg by mouth daily.  , Disp: , Rfl:  atorvastatin (LIPITOR) 20 MG tablet, Take 1 tablet (20 mg total) by mouth every other day., Disp: 45 tablet, Rfl: 0;  calcium carbonate (OS-CAL) 600 MG TABS, Take 600 mg by mouth 2 (two) times daily with a meal.  , Disp: , Rfl: ;  Cholecalciferol (VITAMIN D3) 1000 UNITS CAPS, Take 1,000 Units by mouth.  , Disp: , Rfl: ;  Coenzyme Q10 (COQ10)  100 MG CAPS, Take 100 mg by mouth daily.  , Disp: , Rfl:  Glucosamine-Chondroit-Vit C-Mn (GLUCOSAMINE 1500 COMPLEX PO), Take 1,500 mg by mouth daily.  , Disp: , Rfl: ;  hydrochlorothiazide (HYDRODIURIL) 25 MG tablet, 1/2 by mouth daily, Disp: 45 tablet, Rfl: 2;  levothyroxine (SYNTHROID, LEVOTHROID) 75 MCG tablet, 1/2 tab daily except 1 on Wed, Disp: 48 tablet, Rfl: 2 LORazepam (ATIVAN) 0.5 MG tablet, Take 1 tablet (0.5 mg total) by mouth every 8 (eight) hours as needed for anxiety., Disp: 30 tablet, Rfl: 2;  losartan (COZAAR) 50 MG tablet, Take 0.5 tablets (25 mg total) by mouth daily., Disp: 30 tablet, Rfl: 0;  Multiple Vitamin (MULTIVITAMIN) tablet, Take 1 tablet by mouth daily.  , Disp: , Rfl: ;  Multiple Vitamins-Minerals (OCUVITE PRESERVISION PO), Take by mouth daily.  , Disp: , Rfl:  Omega-3 Fatty Acids (FISH OIL) 1000 MG CAPS, Take 1,000 mg by mouth. Off/on (3-7 x weekly) , Disp: , Rfl: ;  RABEprazole (ACIPHEX) 20 MG tablet, Take 20 mg by mouth 2 (two) times daily.  , Disp: , Rfl: ;  sertraline (ZOLOFT) 25 MG tablet, Take 1 tablet (25 mg total) by mouth daily., Disp: 30 tablet, Rfl: 5;  spironolactone (ALDACTONE) 25 MG tablet, Take 25 mg by mouth as needed. Only takes when BP is over 135/85, Disp: , Rfl:  vitamin E 400 UNIT capsule, Take 400 Units by mouth daily.  , Disp: , Rfl:   BP 118/78  Pulse 66  Resp 16  Ht 5\' 5"  (1.651 m)  Wt 132 lb (59.875 kg)  BMI 21.97 kg/m2  Body mass index is 21.97 kg/(m^2).         Review of Systems     Objective:   Physical Exam blood pressure 118/78 heart rate 66 respirations 16     Assessment:     Well-tolerated laser ablation right small saphenous vein performed under local tumescent anesthesia    Plan:     Return in one week for venous duplex exam of right small saphenous vein to confirm closure Will then schedule for final procedure on the left leg-laser ablation left great saphenous vein

## 2011-08-18 ENCOUNTER — Telehealth: Payer: Self-pay | Admitting: *Deleted

## 2011-08-18 ENCOUNTER — Encounter: Payer: Self-pay | Admitting: Vascular Surgery

## 2011-08-18 NOTE — Telephone Encounter (Signed)
No answer. No answering machine. Will try again later

## 2011-08-20 ENCOUNTER — Telehealth: Payer: Self-pay | Admitting: *Deleted

## 2011-08-20 NOTE — Telephone Encounter (Signed)
Shelly Ross states that after removing the compression dressing last night, she noticed on the back of her right leg (had a endovenous laser ablation right SSV 08-17-2011) that a "vein had popped out" to the side of the area (adjacent) to the area that was treated.  She says she is experiencing itching over that area but no pain.  She says the distended vein on back of right leg has improved since wearing her compression hose.  Instructed her to call [facility] if she experienced pain or swelling in her right foot or behind her right knee or for any other questions or concerns she might have.  Encouraged her to wear compression hose daily and to elevate legs when sitting.  Encouraged her to  use ice compress to affected site  prn pain and to use Ibuprofen 600 mg po TID with meals.  Reminded her of post laser ablation duplex and follow up with Dr. Hart Rochester on 08-25-2011.  [name], [name]

## 2011-08-25 ENCOUNTER — Ambulatory Visit: Payer: Medicare Other | Admitting: Vascular Surgery

## 2011-08-31 ENCOUNTER — Encounter: Payer: Self-pay | Admitting: Vascular Surgery

## 2011-08-31 HISTORY — PX: ENDOVENOUS ABLATION SAPHENOUS VEIN W/ LASER: SUR449

## 2011-09-01 ENCOUNTER — Ambulatory Visit (INDEPENDENT_AMBULATORY_CARE_PROVIDER_SITE_OTHER): Payer: Medicare Other | Admitting: Vascular Surgery

## 2011-09-01 ENCOUNTER — Encounter (INDEPENDENT_AMBULATORY_CARE_PROVIDER_SITE_OTHER): Payer: Medicare Other | Admitting: *Deleted

## 2011-09-01 ENCOUNTER — Other Ambulatory Visit: Payer: Self-pay | Admitting: *Deleted

## 2011-09-01 ENCOUNTER — Encounter: Payer: Self-pay | Admitting: Vascular Surgery

## 2011-09-01 VITALS — BP 161/79 | HR 67 | Resp 20 | Ht 64.5 in | Wt 131.0 lb

## 2011-09-01 DIAGNOSIS — I8310 Varicose veins of unspecified lower extremity with inflammation: Secondary | ICD-10-CM

## 2011-09-01 DIAGNOSIS — I83893 Varicose veins of bilateral lower extremities with other complications: Secondary | ICD-10-CM

## 2011-09-01 NOTE — Progress Notes (Signed)
Subjective:     Patient ID: Shelly Ross, female   DOB: 1924/10/31, 78 y.o.   MRN: [id number]  HPI this 78 year old female returns 2 weeks post laser ablation of the right small saphenous vein performed for painful varicosities. She has had no significant edema distally denies any chest pain, dyspnea on exertion, PND, orthopnea, or hemoptysis. She has been wearing her long leg elastic compression stocking and took ibuprofen as directed. Her husband passed away in the last few weeks and she has been on her feet a lot and this has aggravated her symptoms somewhat.  Past Medical History  Diagnosis Date  . Chronic low back pain   . Premature ventricular contractions   . Dizziness   . Hypertension   . Neuralgia, neuritis, and radiculitis, unspecified   . Degenerative disc disease     CERVICAL SPINE,W/RADICULOPATHY  . GERD (gastroesophageal reflux disease)   . Hyperlipidemia   . Thyroid disease     HYPOTHYROIDISM  . Osteoporosis   . Rib fractures     History  Substance Use Topics  . Smoking status: Never Smoker   . Smokeless tobacco: Never Used  . Alcohol Use: Yes     wine, seldom     Family History  Problem Relation Age of Onset  . Stroke Mother   . Heart disease Father     CAD  . Deep vein thrombosis Brother     AND PTE  . Cancer Other     AUNT INTESTINAL     Allergies  Allergen Reactions  . Lansoprazole     ? intolerance    Current outpatient prescriptions:acetaminophen (TYLENOL EX ST ARTHRITIS PAIN) 500 MG tablet, Take 500 mg by mouth as needed.  , Disp: , Rfl: ;  AMBULATORY NON FORMULARY MEDICATION, Omega Q Plus: 1 by mouth daily (Cardiovascular Health) , Disp: , Rfl: ;  Ascorbic Acid (VITAMIN C) 1000 MG tablet, Take 1,000 mg by mouth daily.  , Disp: , Rfl: ;  aspirin 81 MG tablet, Take 81 mg by mouth daily.  , Disp: , Rfl:  atorvastatin (LIPITOR) 20 MG tablet, Take 1 tablet (20 mg total) by mouth every other day., Disp: 45 tablet, Rfl: 0;  calcium carbonate (OS-CAL)  600 MG TABS, Take 600 mg by mouth 2 (two) times daily with a meal.  , Disp: , Rfl: ;  Cholecalciferol (VITAMIN D3) 1000 UNITS CAPS, Take 1,000 Units by mouth.  , Disp: , Rfl: ;  Coenzyme Q10 (COQ10) 100 MG CAPS, Take 100 mg by mouth daily.  , Disp: , Rfl:  Glucosamine-Chondroit-Vit C-Mn (GLUCOSAMINE 1500 COMPLEX PO), Take 1,500 mg by mouth daily.  , Disp: , Rfl: ;  hydrochlorothiazide (HYDRODIURIL) 25 MG tablet, 1/2 by mouth daily, Disp: 45 tablet, Rfl: 2;  levothyroxine (SYNTHROID, LEVOTHROID) 75 MCG tablet, 1/2 tab daily except 1 on Wed, Disp: 48 tablet, Rfl: 2 LORazepam (ATIVAN) 0.5 MG tablet, Take 1 tablet (0.5 mg total) by mouth every 8 (eight) hours as needed for anxiety., Disp: 30 tablet, Rfl: 2;  losartan (COZAAR) 50 MG tablet, Take 0.5 tablets (25 mg total) by mouth daily., Disp: 30 tablet, Rfl: 0;  Multiple Vitamin (MULTIVITAMIN) tablet, Take 1 tablet by mouth daily.  , Disp: , Rfl: ;  Multiple Vitamins-Minerals (OCUVITE PRESERVISION PO), Take by mouth daily.  , Disp: , Rfl:  Omega-3 Fatty Acids (FISH OIL) 1000 MG CAPS, Take 1,000 mg by mouth. Off/on (3-7 x weekly) , Disp: , Rfl: ;  RABEprazole (ACIPHEX) 20 MG tablet, Take  20 mg by mouth 2 (two) times daily.  , Disp: , Rfl: ;  sertraline (ZOLOFT) 25 MG tablet, Take 1 tablet (25 mg total) by mouth daily., Disp: 30 tablet, Rfl: 5;  spironolactone (ALDACTONE) 25 MG tablet, Take 25 mg by mouth as needed. Only takes when BP is over 135/85, Disp: , Rfl:  vitamin E 400 UNIT capsule, Take 400 Units by mouth daily.  , Disp: , Rfl:   BP 161/79  Pulse 67  Resp 20  Ht 5' 4.5" (1.638 m)  Wt 131 lb (59.421 kg)  BMI 22.14 kg/m2  Body mass index is 22.14 kg/(m^2).         Review of Systems     Objective:   Physical Exam blood pressure 161/79 heart rate 67 respirations 20 Gen. alert and oriented x3 in no apparent distress Lungs no rhonchi or wheezing Cardiovascular regular rhythm no murmurs Right leg with no distal edema. There is mild  tenderness along the course of the small saphenous vein. There are some bulging varicosities in the posterior calf which are less tense than previously.  Today I ordered a venous duplex exam of the right leg which are reviewed and interpreted. There is no DVT. The small saphenous vein is totally occluded throughout.    Assessment:     Successful laser ablation right small saphenous vein for painful varicosities    Plan:     Patient to return to near future for laser ablation left great saphenous vein and then return in 3 months to evaluate for possible stab phlebectomy

## 2011-09-07 ENCOUNTER — Encounter: Payer: Self-pay | Admitting: Internal Medicine

## 2011-09-07 ENCOUNTER — Ambulatory Visit (INDEPENDENT_AMBULATORY_CARE_PROVIDER_SITE_OTHER): Payer: Medicare Other | Admitting: Internal Medicine

## 2011-09-07 VITALS — BP 122/70 | HR 64 | Wt 131.0 lb

## 2011-09-07 DIAGNOSIS — I10 Essential (primary) hypertension: Secondary | ICD-10-CM

## 2011-09-07 MED ORDER — LORAZEPAM 0.5 MG PO TABS: 0.5000 mg | ORAL_TABLET | Freq: Three times a day (TID) | ORAL | Status: DC | PRN

## 2011-09-07 NOTE — Patient Instructions (Signed)
Blood Pressure Goal  Ideally is an AVERAGE < 135/85. This AVERAGE should be calculated from @ least 5-7 BP readings taken @ different times of day on different days of week. You should not respond to isolated BP readings , but rather the AVERAGE for that week . Use an anti-inflammatory cream such as Aspercreme or Zostrix cream twice a day to the knees as needed. In lieu of this warm moist compresses or  hot water bottle can be used. Do not apply ice to the knees.

## 2011-09-07 NOTE — Progress Notes (Signed)
  Subjective:    Patient ID: Shelly Ross, female    DOB: 06-05-1924, 78 y.o.   MRN: [id number]  HPI  On 09/01/11 her blood pressure was 161/79 at the vascular surgery appointment (reviewed). This is in the context of having lost her husband 3/21. Since that time her blood pressure has improved    Review of Systems She denies chest pain, palpitations, significant exertional dyspnea, paroxysmal nocturnal dyspnea, edema, or claudication.  She has been compliant with her medications; these were changed to be taken during the day.  She does describe some depression but does not feel she needs medication. She had been taking sertraline as needed  She does describe pain in her knees and is considering orthopedic followup     Objective:   Physical Exam She appears healthy and well-nourished; she is in no acute distress  No carotid bruits are present.  Heart rhythm and rate are normal with no significant murmurs or gallops.S4 with slurring   Chest is clear with no increased work of breathing  There is no evidence of aortic aneurysm or renal artery bruits  There is no clubbing or edema.   Pedal pulses are intact   No ischemic skin changes are present   Crepitus is present both knees without effusion  She is alert, oriented, interactive. Clinically there is no significant depression         Assessment & Plan:

## 2011-09-07 NOTE — Procedures (Unsigned)
DUPLEX DEEP VENOUS EXAM - LOWER EXTREMITY  INDICATION:  One-week follow-up of right small saphenous vein ablation.  HISTORY:  Edema:  No. Trauma/Surgery:  Right SSV ablation. Pain:  No. PE:  No. Previous DVT:  No. Anticoagulants:  No. Other:  Right GSV ablation 07/13/2011.  DUPLEX EXAM:               CFV   SFV   PopV  PTV    GSV               R  L  R  L  R  L  R   L  R  L Thrombosis    o  o  o     o     o      + Spontaneous   +  +  +     +     +      0 Phasic        +  +  +     +     +      0 Augmentation  +  +  +     +     +      0 Compressible  +  +  +     +     +      0 Competent     0  0  0     +     +      +  Legend:  + - yes  o - no  p - partial  D - decreased  IMPRESSION: 1. No evidence of deep vein thrombosis in the right lower extremity. 2. The right small saphenous vein appears thrombosed in the proximal,     mid, and distal segments.   _____________________________ Quita Skye. Hart Rochester, M.D.  EM/MEDQ  D:  09/02/2011  T:  09/02/2011  Job:  [id number]

## 2011-09-07 NOTE — Assessment & Plan Note (Signed)
Blood pressure control is excellent at this time; no changes indicated. The blood pressure was elevated during the time of significant personal stress.

## 2011-09-14 ENCOUNTER — Telehealth: Payer: Self-pay | Admitting: Internal Medicine

## 2011-09-14 NOTE — Telephone Encounter (Signed)
Refill: Levothyroxine 75 mcg tablets. Take 1/2 by mouth daily, except take 1 on Wednesday. 90 day supply

## 2011-09-15 MED ORDER — LEVOTHYROXINE SODIUM 75 MCG PO TABS: ORAL_TABLET | ORAL | Status: DC

## 2011-09-15 NOTE — Telephone Encounter (Signed)
RX resent

## 2011-09-18 ENCOUNTER — Encounter: Payer: Self-pay | Admitting: Surgery

## 2011-09-21 ENCOUNTER — Ambulatory Visit (INDEPENDENT_AMBULATORY_CARE_PROVIDER_SITE_OTHER): Payer: Medicare Other | Admitting: Vascular Surgery

## 2011-09-21 ENCOUNTER — Encounter: Payer: Self-pay | Admitting: Vascular Surgery

## 2011-09-21 VITALS — BP 161/83 | HR 62 | Resp 20 | Ht 64.5 in | Wt 122.0 lb

## 2011-09-21 DIAGNOSIS — I83893 Varicose veins of bilateral lower extremities with other complications: Secondary | ICD-10-CM

## 2011-09-21 NOTE — Progress Notes (Signed)
Laser Ablation Procedure      Date: 09/21/2011    Shelly Ross DOB:01-02-1925  Consent signed: Yes  Surgeon:J.D. Hart Rochester  Procedure: Laser Ablation: left Greater Saphenous Vein  BP 161/83  Pulse 62  Resp 20  Ht 5' 4.5" (1.638 m)  Wt 122 lb (55.339 kg)  BMI 20.62 kg/m2  Start time: 11:15   End time: 12:00  Tumescent Anesthesia: 300 cc 0.9% NaCl with 50 cc Lidocaine HCL with 1% Epi and 15 cc 8.4% NaHCO3  Local Anesthesia: 6 cc Lidocaine HCL and NaHCO3 (ratio 2:1)  Pulsed mode: Watts 15 Seconds 1 Pulses:1 Total Pulses:107 Total Energy: 1605 Total Time: 1:47    Patient tolerated procedure well: Yes  Notes:   Description of Procedure:  After marking the course of the saphenous vein and the secondary varicosities in the standing position, the patient was placed on the operating table in the supine position, and the left leg was prepped and draped in sterile fashion. Local anesthetic was administered, and under ultrasound guidance the saphenous vein was accessed with a micro needle and guide wire; then the micro puncture sheath was placed. A guide wire was inserted to the saphenofemoral junction, followed by a 5 french sheath.  The position of the sheath and then the laser fiber below the junction was confirmed using the ultrasound and visualization of the aiming beam.  Tumescent anesthesia was administered along the course of the saphenous vein using ultrasound guidance. Protective laser glasses were placed on the patient, and the laser was fired at 15 watt pulsed mode advancing 1-2 mm per sec.  For a total of 1605 joules.  A steri strip was applied to the puncture site.    ABD pads and thigh high compression stockings were applied.  Ace wrap bandages were applied over the phlebectomy sites and at the top of the saphenofemoral junction.  Blood loss was less than 15 cc.  The patient ambulated out of the operating room having tolerated the procedure well.

## 2011-09-21 NOTE — Progress Notes (Signed)
Subjective:     Patient ID: Shelly Ross, female   DOB: [birth year], 78 y.o.   MRN: [id number]  HPI this 78 year old female had laser ablation of the left great saphenous spine performed under local tumescent anesthesia today. She tolerated the procedure well.   Review of Systems     Objective:   Physical ExamBP 161/83  Pulse 62  Resp 20  Ht 5' 4.5" (1.638 m)  Wt 122 lb (55.339 kg)  BMI 20.62 kg/m2     Assessment:    well-tolerated laser ablation left great saphenous spine for painful varicosities do to valvular incompetence left great saphenous system    Plan:     Return in one week for venous duplex exam to confirm closure left great saphenous vein We'll then have patient return in 3 months to see if stab phlebectomy indicated

## 2011-09-22 ENCOUNTER — Telehealth: Payer: Self-pay | Admitting: *Deleted

## 2011-09-22 ENCOUNTER — Other Ambulatory Visit: Payer: Self-pay | Admitting: *Deleted

## 2011-09-22 DIAGNOSIS — I83893 Varicose veins of bilateral lower extremities with other complications: Secondary | ICD-10-CM

## 2011-09-22 NOTE — Telephone Encounter (Signed)
Reached patient at home. Doing well except didn't sleep well last night. Following all instructions. Reminded her of her fu appt. Next week.

## 2011-09-28 ENCOUNTER — Encounter: Payer: Self-pay | Admitting: Vascular Surgery

## 2011-09-29 ENCOUNTER — Ambulatory Visit (INDEPENDENT_AMBULATORY_CARE_PROVIDER_SITE_OTHER): Payer: Medicare Other | Admitting: Vascular Surgery

## 2011-09-29 ENCOUNTER — Encounter (INDEPENDENT_AMBULATORY_CARE_PROVIDER_SITE_OTHER): Payer: Medicare Other | Admitting: *Deleted

## 2011-09-29 ENCOUNTER — Encounter: Payer: Self-pay | Admitting: Vascular Surgery

## 2011-09-29 VITALS — BP 131/73 | HR 65 | Resp 16 | Ht 64.0 in | Wt 132.0 lb

## 2011-09-29 DIAGNOSIS — I83893 Varicose veins of bilateral lower extremities with other complications: Secondary | ICD-10-CM

## 2011-09-29 DIAGNOSIS — Z48812 Encounter for surgical aftercare following surgery on the circulatory system: Secondary | ICD-10-CM

## 2011-09-29 NOTE — Progress Notes (Signed)
Subjective:     Patient ID: Shelly Ross, female   DOB: 02/14/1925, 78 y.o.   MRN: [id number]  HPI is 78 year old female patient returns for followup regarding her laser ablation left great saphenous vein performed one week ago for painful varicosities do 2 valvular incompetence in the left great saphenous system. She has done very well with mild discomfort along the course of the great saphenous vein in the inguinal and thigh area and some bruising. She has had no distal edema. She denies any chest pain, dyspnea on exertion, hemoptysis, or other pulmonary type symptoms. She does have a history of bleeding in her right leg from painful varicosities in the ankle area and has had previous laser ablation right great and small saphenous systems.  Past Medical History  Diagnosis Date  . Chronic low back pain   . Premature ventricular contractions   . Dizziness   . Hypertension   . Neuralgia, neuritis, and radiculitis, unspecified   . Degenerative disc disease     CERVICAL SPINE,W/RADICULOPATHY  . GERD (gastroesophageal reflux disease)   . Hyperlipidemia   . Thyroid disease     HYPOTHYROIDISM  . Osteoporosis   . Rib fractures     History  Substance Use Topics  . Smoking status: Never Smoker   . Smokeless tobacco: Never Used  . Alcohol Use: Yes     wine, seldom     Family History  Problem Relation Age of Onset  . Stroke Mother   . Heart disease Father     CAD  . Deep vein thrombosis Brother     AND PTE  . Cancer Other     AUNT INTESTINAL     Allergies  Allergen Reactions  . Lansoprazole     ? intolerance    Current outpatient prescriptions:acetaminophen (TYLENOL ARTHRITIS PAIN) 650 MG CR tablet, Take 650 mg by mouth as needed., Disp: , Rfl: ;  AMBULATORY NON FORMULARY MEDICATION, Omega Q Plus: 1 by mouth daily (Cardiovascular Health) , Disp: , Rfl: ;  aspirin 81 MG tablet, Take 81 mg by mouth daily.  , Disp: , Rfl: ;  atorvastatin (LIPITOR) 20 MG tablet, Take 20 mg by mouth.  1/2 by mouth once daily, Disp: , Rfl:  calcium carbonate (OS-CAL) 600 MG TABS, Take 600 mg by mouth 2 (two) times daily with a meal.  , Disp: , Rfl: ;  Cholecalciferol (VITAMIN D3) 1000 UNITS CAPS, Take 1,000 Units by mouth.  , Disp: , Rfl: ;  Cimetidine (ACID REDUCER PO), Take by mouth. [employer] Brand: 1 by mouth daily, Disp: , Rfl: ;  Coenzyme Q10 (COQ10) 100 MG CAPS, Take 100 mg by mouth daily.  , Disp: , Rfl:  Glucosamine-Chondroit-Vit C-Mn (GLUCOSAMINE 1500 COMPLEX PO), Take 1,500 mg by mouth daily.  , Disp: , Rfl: ;  hydrochlorothiazide (HYDRODIURIL) 25 MG tablet, 1/2 by mouth daily, Disp: 45 tablet, Rfl: 2;  levothyroxine (SYNTHROID, LEVOTHROID) 75 MCG tablet, 1/2 tab daily except 1 on Wed, Disp: 48 tablet, Rfl: 2 LORazepam (ATIVAN) 0.5 MG tablet, Take 1 tablet (0.5 mg total) by mouth every 8 (eight) hours as needed for anxiety., Disp: 30 tablet, Rfl: 1;  losartan (COZAAR) 50 MG tablet, Take 0.5 tablets (25 mg total) by mouth daily., Disp: 30 tablet, Rfl: 0;  Multiple Vitamin (MULTIVITAMIN) tablet, Take 1 tablet by mouth daily.  , Disp: , Rfl: ;  Multiple Vitamins-Minerals (OCUVITE PRESERVISION PO), Take by mouth daily.  , Disp: , Rfl:  Omega-3 Fatty Acids (FISH OIL) 1000  MG CAPS, Take 1,000 mg by mouth. 1 by mouth daily, Disp: , Rfl: ;  RABEprazole (ACIPHEX) 20 MG tablet, Take 20 mg by mouth 2 (two) times daily.  , Disp: , Rfl: ;  spironolactone (ALDACTONE) 25 MG tablet, Take 25 mg by mouth as needed. Only takes when BP is over 135/85, Disp: , Rfl: ;  vitamin E 400 UNIT capsule, Take 400 Units by mouth daily.  , Disp: , Rfl:   BP 131/73  Pulse 65  Resp 16  Ht 5\' 4"  (1.626 m)  Wt 132 lb (59.875 kg)  BMI 22.66 kg/m2  Body mass index is 22.66 kg/(m^2).          Review of Systems see history of present illness-     Objective:   Physical Exam blood pressure 131/73 heart rate 65 respirations 16 General well-developed well-nourished female in no apparent distress alert and oriented  x3 Lungs no rhonchi or wheezing Left lower extremity with some mild/moderate ecchymosis in the mid to proximal thigh. Great saphenous vein is palpable where ablation has been performed. There is no distal edema. Right leg has bulging varicosities in the posterior thigh popliteal fossa and posterior calf. There is evidence of where the previous bleed occurred in the right ankle area. She has 2+ dorsalis pedis pulses palpable bilaterally.  Today I ordered a venous duplex exam of the left leg which are reviewed and interpreted. There is no DVT. There is total ablation of left great saphenous vein to a 0.1 cm distal to the saphenofemoral junction    Assessment:     Successful laser ablation left great saphenous vein for painful varicosities with venous hypertension    Plan:     Return in 3 months to see if stab phlebectomy and 2 sessions sclerotherapy necessary to complete treatment of right lower extremity an area where previous bleed occurred and for bulging varicosities posterior leg She will wear long leg elastic compression stockings 20-30 mm gradient and continue to try elevation and ibuprofen for the next 3 months

## 2011-09-30 ENCOUNTER — Other Ambulatory Visit: Payer: Self-pay | Admitting: Internal Medicine

## 2011-09-30 NOTE — Telephone Encounter (Signed)
Refill done.  

## 2011-10-02 ENCOUNTER — Other Ambulatory Visit: Payer: Self-pay | Admitting: Internal Medicine

## 2011-10-02 MED ORDER — ATORVASTATIN CALCIUM 20 MG PO TABS: ORAL_TABLET | ORAL | Status: DC

## 2011-10-02 NOTE — Telephone Encounter (Signed)
Refill Atorvastatin Tab 20MG   Qty 90 Take one by mouth every other day   Last filled 1.4.13 Last Ov 4.8.13

## 2011-10-02 NOTE — Telephone Encounter (Signed)
RX sent , patient needs to have lipids/hep 272.4/995.20

## 2011-10-06 NOTE — Procedures (Unsigned)
DUPLEX DEEP VENOUS EXAM - LOWER EXTREMITY  INDICATION:  One week status post left GSV ablation.  HISTORY:  Edema:  Yes. Trauma/Surgery:  EVLT. Pain:  Yes. PE:  No. Previous DVT:  No. Anticoagulants:  No. Other:  DUPLEX EXAM:               CFV   SFV   PopV  PTV    GSV               R  L  R  L  R  L  R   L  R  L Thrombosis       o     o     o      o     + Spontaneous      +     +     +            o Phasic           +     +     +            o Augmentation     +     +     +      +     o Compressible     +     +     +      +     o Competent  Legend:  + - yes  o - no  p - partial  D - decreased  IMPRESSION:  Successful ablation of the left great saphenous vein 1 cm distal to the junction through the insertion point.  No deep vein involvement is observed.   _____________________________ Quita Skye. Hart Rochester, M.D.  LT/MEDQ  D:  09/29/2011  T:  09/29/2011  Job:  [id number]

## 2011-10-14 ENCOUNTER — Encounter: Payer: Self-pay | Admitting: Vascular Surgery

## 2011-12-02 ENCOUNTER — Telehealth: Payer: Self-pay | Admitting: Internal Medicine

## 2011-12-02 DIAGNOSIS — E785 Hyperlipidemia, unspecified: Secondary | ICD-10-CM

## 2011-12-02 DIAGNOSIS — T887XXA Unspecified adverse effect of drug or medicament, initial encounter: Secondary | ICD-10-CM

## 2011-12-02 MED ORDER — ATORVASTATIN CALCIUM 20 MG PO TABS: ORAL_TABLET | ORAL | Status: DC

## 2011-12-02 NOTE — Telephone Encounter (Signed)
Atorvastatin calcium tablet 20 mg #90

## 2011-12-10 ENCOUNTER — Other Ambulatory Visit: Payer: Self-pay | Admitting: Internal Medicine

## 2011-12-17 ENCOUNTER — Emergency Department (HOSPITAL_COMMUNITY)
Admission: EM | Admit: 2011-12-17 | Discharge: 2011-12-18 | Disposition: A | Discharge status: Home / Self Care | Payer: Medicare Other | Attending: Emergency Medicine | Admitting: Emergency Medicine

## 2011-12-17 ENCOUNTER — Encounter (HOSPITAL_COMMUNITY): Payer: Self-pay | Admitting: *Deleted

## 2011-12-17 DIAGNOSIS — I10 Essential (primary) hypertension: Secondary | ICD-10-CM | POA: Insufficient documentation

## 2011-12-17 DIAGNOSIS — E079 Disorder of thyroid, unspecified: Secondary | ICD-10-CM | POA: Insufficient documentation

## 2011-12-17 DIAGNOSIS — M810 Age-related osteoporosis without current pathological fracture: Secondary | ICD-10-CM | POA: Insufficient documentation

## 2011-12-17 DIAGNOSIS — Z79899 Other long term (current) drug therapy: Secondary | ICD-10-CM | POA: Insufficient documentation

## 2011-12-17 DIAGNOSIS — I83893 Varicose veins of bilateral lower extremities with other complications: Secondary | ICD-10-CM | POA: Insufficient documentation

## 2011-12-17 DIAGNOSIS — Z7982 Long term (current) use of aspirin: Secondary | ICD-10-CM | POA: Insufficient documentation

## 2011-12-17 DIAGNOSIS — K219 Gastro-esophageal reflux disease without esophagitis: Secondary | ICD-10-CM | POA: Insufficient documentation

## 2011-12-17 DIAGNOSIS — M5030 Other cervical disc degeneration, unspecified cervical region: Secondary | ICD-10-CM | POA: Insufficient documentation

## 2011-12-17 DIAGNOSIS — E785 Hyperlipidemia, unspecified: Secondary | ICD-10-CM | POA: Insufficient documentation

## 2011-12-17 DIAGNOSIS — I83899 Varicose veins of unspecified lower extremities with other complications: Secondary | ICD-10-CM

## 2011-12-17 NOTE — ED Notes (Signed)
LKG:MW10<UV> Expected date:<BR> Expected time:<BR> Means of arrival:<BR> Comments:<BR> 78 yo with ruptured varicose vein

## 2011-12-17 NOTE — ED Notes (Signed)
Per EMS:  Pt had a ruptured varicose vein on her LL leg, 50cc of blood over 10 min.  Bleeding controlled now with 4x4 and wrap.  Pt conscious, alert, oriented x4, no distress.  [facility].

## 2011-12-18 NOTE — ED Provider Notes (Addendum)
History     CSN: [id number]  Arrival date & time 12/17/11  2314   First MD Initiated Contact with Patient 12/17/11 2336      No chief complaint on file.   (Consider location/radiation/quality/duration/timing/severity/associated sxs/prior treatment) HPI 78 year old female presents via EMS from home after a varicose vein in her right ankle began to bleed. Patient reports this happened to her once before about 8 months ago. She reports she held pressure at that time and it stopped on its own. She reports this time she was unable to get the bleeding to stop. Patient denies any other complaints at this time. She reports she did not injure the varicosity, he just got out of the bath and was clipping her toenails when the bleeding started.  Past Medical History  Diagnosis Date  . Chronic low back pain   . Premature ventricular contractions   . Dizziness   . Hypertension   . Neuralgia, neuritis, and radiculitis, unspecified   . Degenerative disc disease     CERVICAL SPINE,W/RADICULOPATHY  . GERD (gastroesophageal reflux disease)   . Hyperlipidemia   . Thyroid disease     HYPOTHYROIDISM  . Osteoporosis   . Rib fractures     Past Surgical History  Procedure Date  . Abdominal hysterectomy     WITH OOPHORECTOMY,? BILATERAL FOR ENDOMETRIOSIS  . Breast enhancement surgery   . G2 p2   . Bladder tacking   . Endovenous ablation saphenous vein w/ laser 07-13-2011    right greater saphenous vein, Dr Jerilee Field    Family History  Problem Relation Age of Onset  . Stroke Mother   . Heart disease Father     CAD  . Deep vein thrombosis Brother     AND PTE  . Cancer Other     AUNT INTESTINAL     History  Substance Use Topics  . Smoking status: Never Smoker   . Smokeless tobacco: Never Used  . Alcohol Use: Yes     wine, seldom     OB History    Grav Para Term Preterm Abortions TAB SAB Ect Mult Living                  Review of Systems  All other systems reviewed and are  negative.    Allergies  Lansoprazole  Home Medications   Current Outpatient Rx  Name Route Sig Dispense Refill  . ASPIRIN 81 MG PO TABS Oral Take 81 mg by mouth daily.      . ATORVASTATIN CALCIUM 20 MG PO TABS  1/2 by mouth once daily **LABS DUE** 45 tablet 0    1/2 by mouth daily **LABS OVERDUE**  . CALCIUM CARBONATE 600 MG PO TABS Oral Take 600 mg by mouth 2 (two) times daily with a meal.      . VITAMIN D3 1000 UNITS PO CAPS Oral Take 1,000 Units by mouth.      . COQ10 100 MG PO CAPS Oral Take 100 mg by mouth daily.      Marland Kitchen GLUCOSAMINE 1500 COMPLEX PO Oral Take 1,500 mg by mouth daily.      Marland Kitchen HYDROCHLOROTHIAZIDE 25 MG PO TABS  1/2 by mouth daily 45 tablet 2  . LEVOTHYROXINE SODIUM 75 MCG PO TABS  1/2 tab daily except 1 on Wed 48 tablet 2    RESENDING, FILL IF NOT ON FILE  . LORAZEPAM 0.5 MG PO TABS Oral Take 1 tablet (0.5 mg total) by mouth every 8 (eight)  hours as needed for anxiety. 30 tablet 1  . LOSARTAN POTASSIUM 50 MG PO TABS  TAKE 1/2 TABLET BY MOUTH ONCE A DAY 45 tablet 1  . ONE-DAILY MULTI VITAMINS PO TABS Oral Take 1 tablet by mouth daily.      Idolina Primer PRESERVISION PO Oral Take by mouth daily.      Marland Kitchen FISH OIL 1000 MG PO CAPS Oral Take 1,000 mg by mouth. 1 by mouth daily    . SPIRONOLACTONE 25 MG PO TABS Oral Take 25 mg by mouth as needed. Only takes when BP is over 135/85    . VITAMIN E 400 UNITS PO CAPS Oral Take 400 Units by mouth daily.        BP 142/65  Pulse 71  Temp 98.3 F (36.8 C) (Oral)  Resp 18  SpO2 99%  Physical Exam  Nursing note and vitals reviewed. Constitutional: She appears well-developed and well-nourished. No distress.  Musculoskeletal: Normal range of motion. She exhibits no edema and no tenderness.  Skin: Skin is warm and dry. No rash noted. No erythema. No pallor.       Pinpoint area of bleeding from varicosity on anterior aspect of right ankle. Bleeding can be controlled with compression of the varicose veins. No bruising, erythema, or  cellulitic changes noted    ED Course  Procedures (including critical care time)  Labs Reviewed - No data to display No results found.   Dermabond was placed over the area of bleeding while compression was held to the proximal and distal vein. Patient tolerated procedure well. No further bleeding. Area dressed, and the patient ambulated without further bleeding. Will discharge home to followup with her primary care Dr. Patient reports she has had vein work in the past, and will followup with her "vein doctor"  1. Bleeding from varicose veins of lower extremity       MDM  78 yo with small varicose bleeding, resolved with pressure and dermabond.       Shelly Mackie, MD 12/18/11 0028  Shelly Mackie, MD 12/18/11 [phone]

## 2011-12-18 NOTE — ED Notes (Signed)
EDP Otter in with pt applying dermabond

## 2011-12-22 ENCOUNTER — Ambulatory Visit (INDEPENDENT_AMBULATORY_CARE_PROVIDER_SITE_OTHER): Payer: Medicare Other | Admitting: Internal Medicine

## 2011-12-22 ENCOUNTER — Encounter: Payer: Self-pay | Admitting: Internal Medicine

## 2011-12-22 VITALS — BP 128/80 | HR 70 | Temp 98.3°F | Wt 130.8 lb

## 2011-12-22 DIAGNOSIS — I83899 Varicose veins of unspecified lower extremities with other complications: Secondary | ICD-10-CM

## 2011-12-22 DIAGNOSIS — I83893 Varicose veins of bilateral lower extremities with other complications: Secondary | ICD-10-CM

## 2011-12-22 NOTE — Patient Instructions (Addendum)
Share results with Nursing @ the  [facility] facility

## 2011-12-22 NOTE — Progress Notes (Signed)
  Subjective:    Patient ID: Shelly Ross, female    DOB: Oct 13, 1924, 78 y.o.   MRN: [id number]  HPI She was seen in emergency room 12/17/2011 for recurrence of venous bleeding at the right anterior ankle area. She had a similar problem approximately a year ago in same area. At the time of the initial varicosity bleeding; she was not on aspirin. The bleeding 7/18 had actually been controlled by EMS prior to her arrival in the emergency room.  Significantly she's had saphenous laser ablation of the right LE in February of this year and in April of  on the left by Dr Hart Rochester.  She continues to have painful varicosities    Review of Systems She has had some mild intermittent epistaxis with extrinsic rhinitis. She denies hemoptysis, melena, rectal bleeding,or hematuria. She does have easy bruising. She is on no blood thinners other than 81 mg of baby aspirin daily as CVA prophylaxis.  Her last CBC and differential 10/12 revealed no anemia or thrombocytopenia    Objective:   Physical Exam   She is in no distress; she appears adequately nourished  She is no scleral icterus or jaundice.  Nasal tissues are pink and moist with no lesions  There is no lymphadenopathy about the neck or axilla  S4 with slurring is present. Chest is clear with no increased work of breathing.  She has no organomegaly or masses  Venous spiders are noted over the lower extremities. There is an eschar @ the R anterior ankle which has a clear sealant over it  Pedal pulses tach; she has no significant edema          Assessment & Plan:  #1 recurrent venous spiders bleeding; no bleeding dyscrasia is suggested as this is a local and systemic recurrence. I would not discontinue the aspirin prophylaxis.  Plan: Reassessment by Dr. Hart Rochester

## 2011-12-24 ENCOUNTER — Emergency Department (HOSPITAL_COMMUNITY)
Admission: EM | Admit: 2011-12-24 | Discharge: 2011-12-24 | Disposition: A | Discharge status: Home / Self Care | Payer: Medicare Other | Attending: Emergency Medicine | Admitting: Emergency Medicine

## 2011-12-24 ENCOUNTER — Encounter (HOSPITAL_COMMUNITY): Payer: Self-pay | Admitting: Emergency Medicine

## 2011-12-24 ENCOUNTER — Telehealth: Payer: Self-pay | Admitting: Internal Medicine

## 2011-12-24 ENCOUNTER — Other Ambulatory Visit: Payer: Self-pay

## 2011-12-24 ENCOUNTER — Emergency Department (HOSPITAL_COMMUNITY): Payer: Medicare Other

## 2011-12-24 DIAGNOSIS — G8929 Other chronic pain: Secondary | ICD-10-CM | POA: Insufficient documentation

## 2011-12-24 DIAGNOSIS — E785 Hyperlipidemia, unspecified: Secondary | ICD-10-CM | POA: Insufficient documentation

## 2011-12-24 DIAGNOSIS — M810 Age-related osteoporosis without current pathological fracture: Secondary | ICD-10-CM | POA: Insufficient documentation

## 2011-12-24 DIAGNOSIS — R1013 Epigastric pain: Secondary | ICD-10-CM

## 2011-12-24 DIAGNOSIS — E039 Hypothyroidism, unspecified: Secondary | ICD-10-CM | POA: Insufficient documentation

## 2011-12-24 DIAGNOSIS — I10 Essential (primary) hypertension: Secondary | ICD-10-CM | POA: Insufficient documentation

## 2011-12-24 DIAGNOSIS — K219 Gastro-esophageal reflux disease without esophagitis: Secondary | ICD-10-CM | POA: Insufficient documentation

## 2011-12-24 LAB — BASIC METABOLIC PANEL WITH GFR
BUN: 16 mg/dL (ref 6–23)
CO2: 30 meq/L (ref 19–32)
Chloride: 103 meq/L (ref 96–112)
Creatinine, Ser: 0.68 mg/dL (ref 0.50–1.10)
GFR calc Af Amer: 90 mL/min — ABNORMAL LOW (ref >90)
Potassium: 3.8 meq/L (ref 3.5–5.1)

## 2011-12-24 LAB — HEPATIC FUNCTION PANEL
ALT: 18 U/L (ref 0–35)
AST: 24 U/L (ref 0–37)
Albumin: 3.8 g/dL (ref 3.5–5.2)
Alkaline Phosphatase: 63 U/L (ref 39–117)
Bilirubin, Direct: <0.1 mg/dL (ref 0.0–0.3)
Total Bilirubin: 0.4 mg/dL (ref 0.3–1.2)
Total Protein: 7.1 g/dL (ref 6.0–8.3)

## 2011-12-24 LAB — CBC WITH DIFFERENTIAL/PLATELET
Basophils Absolute: 0 10*3/uL (ref 0.0–0.1)
Basophils Relative: 0 % (ref 0–1)
Eosinophils Absolute: 0.4 10*3/uL (ref 0.0–0.7)
Eosinophils Relative: 6 % — ABNORMAL HIGH (ref 0–5)
HCT: 42 % (ref 36.0–46.0)
Hemoglobin: 14.3 g/dL (ref 12.0–15.0)
Lymphocytes Relative: 39 % (ref 12–46)
Lymphs Abs: 2.8 10*3/uL (ref 0.7–4.0)
MCH: 31.2 pg (ref 26.0–34.0)
MCHC: 34 g/dL (ref 30.0–36.0)
MCV: 91.5 fL (ref 78.0–100.0)
Monocytes Absolute: 0.5 K/uL (ref 0.1–1.0)
Monocytes Relative: 7 % (ref 3–12)
Neutro Abs: 3.6 K/uL (ref 1.7–7.7)
Neutrophils Relative %: 49 % (ref 43–77)
Platelets: 259 10*3/uL (ref 150–400)
RBC: 4.59 MIL/uL (ref 3.87–5.11)
RDW: 12.6 % (ref 11.5–15.5)
WBC: 7.3 K/uL (ref 4.0–10.5)

## 2011-12-24 LAB — LIPASE, BLOOD: Lipase: 34 U/L (ref 11–59)

## 2011-12-24 LAB — BASIC METABOLIC PANEL
Calcium: 10.3 mg/dL (ref 8.4–10.5)
GFR calc non Af Amer: 78 mL/min — ABNORMAL LOW (ref >90)
Glucose, Bld: 91 mg/dL (ref 70–99)
Sodium: 141 mEq/L (ref 135–145)

## 2011-12-24 NOTE — ED Provider Notes (Signed)
History     CSN: [id number]  Arrival date & time 12/24/11  1333   First MD Initiated Contact with Patient 12/24/11 [phone]      Chief Complaint  Patient presents with  . Chest Pain    (Consider location/radiation/quality/duration/timing/severity/associated sxs/prior treatment) HPI Comments: She reports that approximately 10:30 this morning she was walking in her house to the back of her home and developed the sudden onset of upper epigastric and lower chest discomfort. The pain lasted approximately 10 minutes and gradually resolved and has not recurred since then. She reports that this was not associated with nausea, vomiting, shortness of breath, sweats. She did not feel lightheaded or feel faint. The pain did not radiate to her back, jaw, arms, lower abdomen. She did not take any medications prior to arrival. She called her regular physician's office, Dr. Alwyn Ren, spoke to the triage nurse and was directed to come to the emergency department. She remains pain-free and symptom-free during this time. Patient reports that she was in her prior usual state of health. She denies any prior history of cardiac disease except for ablation procedure for atrial fibrillation approximately 10 years ago. She has a significant history of hypertension, hyperlipidemia. She is a nonsmoker. She endorses family history of cardiac disease.  Patient is a 78 y.o. female presenting with chest pain. The history is provided by the patient and a relative.  Chest Pain Primary symptoms include abdominal pain. Pertinent negatives for primary symptoms include no fever, no shortness of breath, no cough, no palpitations, no nausea and no vomiting.     Past Medical History  Diagnosis Date  . Chronic low back pain   . Premature ventricular contractions   . Dizziness   . Hypertension   . Neuralgia, neuritis, and radiculitis, unspecified   . Degenerative disc disease     CERVICAL SPINE,W/RADICULOPATHY  . GERD  (gastroesophageal reflux disease)   . Hyperlipidemia   . Thyroid disease     HYPOTHYROIDISM  . Osteoporosis   . Rib fractures     Past Surgical History  Procedure Date  . Abdominal hysterectomy     WITH OOPHORECTOMY,? BILATERAL FOR ENDOMETRIOSIS  . Breast enhancement surgery   . G2 p2   . Bladder tacking   . Endovenous ablation saphenous vein w/ laser 07-13-2011    right greater saphenous vein, Dr Jerilee Field  . Endovenous ablation saphenous vein w/ laser 08/2011    L ; Dr Hart Rochester    Family History  Problem Relation Age of Onset  . Stroke Mother   . Heart disease Father     CAD  . Deep vein thrombosis Brother     AND PTE  . Cancer Other     AUNT INTESTINAL     History  Substance Use Topics  . Smoking status: Never Smoker   . Smokeless tobacco: Never Used  . Alcohol Use: Yes     wine, seldom     OB History    Grav Para Term Preterm Abortions TAB SAB Ect Mult Living                  Review of Systems  Constitutional: Negative for fever and chills.  Respiratory: Negative for cough and shortness of breath.   Cardiovascular: Positive for chest pain. Negative for palpitations and leg swelling.  Gastrointestinal: Positive for abdominal pain. Negative for nausea and vomiting.  Musculoskeletal: Negative for back pain.  All other systems reviewed and are negative.    Allergies  Percocet and Lansoprazole  Home Medications   Current Outpatient Rx  Name Route Sig Dispense Refill  . ASPIRIN 81 MG PO TABS Oral Take 81 mg by mouth daily.      . ATORVASTATIN CALCIUM 20 MG PO TABS  1/2 by mouth once daily **LABS DUE** 45 tablet 0    1/2 by mouth daily **LABS OVERDUE**  . CALCIUM CARBONATE 600 MG PO TABS Oral Take 600 mg by mouth daily.     Marland Kitchen VITAMIN D3 1000 UNITS PO CAPS Oral Take 1,000 Units by mouth daily.     . COQ10 100 MG PO CAPS Oral Take 100 mg by mouth daily.      Marland Kitchen GLUCOSAMINE 1500 COMPLEX PO Oral Take 1,500 mg by mouth daily.      Marland Kitchen HYDROCHLOROTHIAZIDE 25  MG PO TABS  1/2 by mouth daily 45 tablet 2  . LEVOTHYROXINE SODIUM 75 MCG PO TABS  1/2 tab daily except 1 on Wed 48 tablet 2    RESENDING, FILL IF NOT ON FILE  . LOSARTAN POTASSIUM 50 MG PO TABS  TAKE 1/2 TABLET BY MOUTH ONCE A DAY 45 tablet 1  . ONE-DAILY MULTI VITAMINS PO TABS Oral Take 1 tablet by mouth daily.      Idolina Primer PRESERVISION PO Oral Take by mouth daily.      Marland Kitchen SPIRONOLACTONE 25 MG PO TABS Oral Take 25 mg by mouth as needed. Only takes when BP is over 135/85    . VITAMIN E 400 UNITS PO CAPS Oral Take 400 Units by mouth daily.      Marland Kitchen LORAZEPAM 0.5 MG PO TABS Oral Take 1 tablet (0.5 mg total) by mouth every 8 (eight) hours as needed for anxiety. 30 tablet 1    BP 121/74  Pulse 57  Temp 97.5 F (36.4 C) (Oral)  Resp 16  SpO2 100%  Physical Exam  Nursing note and vitals reviewed. Constitutional: She is oriented to person, place, and time. She appears well-developed and well-nourished.  HENT:  Head: Normocephalic and atraumatic.  Eyes: Pupils are equal, round, and reactive to light. No scleral icterus.  Neck: Normal range of motion. Neck supple.  Cardiovascular: Normal rate.   No murmur heard. Pulmonary/Chest: Effort normal. No respiratory distress.  Abdominal: Soft. She exhibits no distension. There is no tenderness. There is no rebound and no guarding.  Musculoskeletal: She exhibits no edema.  Neurological: She is alert and oriented to person, place, and time.  Skin: Skin is warm and dry. No rash noted.  Psychiatric: She has a normal mood and affect.    ED Course  Procedures (including critical care time)  Labs Reviewed  CBC WITH DIFFERENTIAL - Abnormal; Notable for the following:    Eosinophils Relative 6 (*)     All other components within normal limits  BASIC METABOLIC PANEL - Abnormal; Notable for the following:    GFR calc non Af Amer 78 (*)     GFR calc Af Amer 90 (*)     All other components within normal limits  POCT I-STAT TROPONIN I  LIPASE, BLOOD    HEPATIC FUNCTION PANEL   Dg Chest 2 View  12/24/2011  *RADIOLOGY REPORT*  Clinical Data: Chest pain, sharp in nature, earlier today.  CHEST - 2 VIEW  Comparison: 09/30/2007  Findings: Cardiac and mediastinal silhouette appear unremarkable. No cardiomegaly.  There are no infiltrates or failure.  Slight retrocardiac density not noted previously could represent subsegmental atelectasis or hiatal hernia.  Moderate degenerative  scoliosis appears to have progressed from priors.  This is centered at the thoracolumbar junction. Multiple old healed rib fractures on the left.  IMPRESSION:  No infiltrates or failure.  Slight retrocardiac density could represent mild subsegmental atelectasis or hiatal hernia.  Increasing degenerative scoliosis convex left thoracolumbar region.  Original Report Authenticated By: Elsie Stain, M.D.     1. Epigastric pain     Room air saturation is 99% and I interpret this to be normal.  EKG shows SR at rate 65, first degree block, LAD, poor r wave progression, LAD, no ST or T wave abns.  Compared to ECG from 09/30/07 shows PR interval has lengthened.     Pt continued to feel at baseline, no recurrence of pain, labs unremarkable, I spoke to Dr. Alwyn Ren who will see pt in follow up and agrees with me that pt can be discharged to home.  Pt and family are agreeable, know to return if worse  MDM  Pt with atypical pain.  Given age and some risks, will obtain troponins here.  If neg, will discuss with Dr. Alwyn Ren to arrange follow up.  Will also check LFTs and lipase.  WBC is normal.  Pt had no vomiting to suggest acute biliary colic.          Gavin Pound. Oletta Lamas, MD 12/24/11 [phone]

## 2011-12-24 NOTE — ED Notes (Signed)
MD at bedside. 

## 2011-12-24 NOTE — Telephone Encounter (Signed)
Caller: Shelly Ross/Patient; PCP: Marga Melnick; CB#: [phone]; ; ; Call regarding Chest Pain/Chest Discomfort; States that had a stabbing "sharpest pain across my ribs, just below my breast." Onset 1 hour ago, 1042AM. Lasted approx 2 minutes "I think". Has HTN, HX of palpatations.  "I called the nurse and she came to take my vital signs and were: 118/78, 68, 98%. Pain is gone at time of call. Advised ED per nursing judgment. Pt daughter will arrive at pt house shortly and will take her to ED.

## 2011-12-24 NOTE — ED Notes (Signed)
Pt reports sharp chest pain radiating to b/l rib area. Pt denies pain at present. Pt did not have any other symptoms at time of chest pain.

## 2011-12-24 NOTE — ED Notes (Signed)
Pt ambulated to restroom with minimal assistance. MD Ghim at bedside.

## 2011-12-24 NOTE — ED Notes (Signed)
Contacted Lab- they will add on blood work.

## 2011-12-25 ENCOUNTER — Encounter: Payer: Self-pay | Admitting: Vascular Surgery

## 2011-12-28 ENCOUNTER — Encounter: Payer: Self-pay | Admitting: Vascular Surgery

## 2011-12-28 ENCOUNTER — Ambulatory Visit (INDEPENDENT_AMBULATORY_CARE_PROVIDER_SITE_OTHER): Payer: Medicare Other | Admitting: Vascular Surgery

## 2011-12-28 ENCOUNTER — Encounter (INDEPENDENT_AMBULATORY_CARE_PROVIDER_SITE_OTHER): Payer: Medicare Other | Admitting: *Deleted

## 2011-12-28 VITALS — BP 154/79 | HR 65 | Resp 16 | Ht 64.0 in | Wt 132.0 lb

## 2011-12-28 DIAGNOSIS — I83893 Varicose veins of bilateral lower extremities with other complications: Secondary | ICD-10-CM

## 2011-12-28 NOTE — Progress Notes (Signed)
Subjective:     Patient ID: Shelly Ross, female   DOB: 17-Apr-1925, 78 y.o.   MRN: [id number]  HPI this 77 year old female has had previous laser ablation of the right great and small saphenous vein because of painful varicosities and a history of a bleeding varix in the right ankle about one year ago. This was performed earlier this year without difficulty. 2 weeks ago she had an episode of spontaneous bleeding in the right ankle from the same area. She has had decreased pain from her painful varicosities since the laser ablations done earlier this year. This required a trip to the emergency department and the bleeding was controlled by compression. She comes today for further evaluation. She has had no other complications of her venous disease.  Past Medical History  Diagnosis Date  . Chronic low back pain   . Premature ventricular contractions   . Dizziness   . Hypertension   . Neuralgia, neuritis, and radiculitis, unspecified   . Degenerative disc disease     CERVICAL SPINE,W/RADICULOPATHY  . GERD (gastroesophageal reflux disease)   . Hyperlipidemia   . Thyroid disease     HYPOTHYROIDISM  . Osteoporosis   . Rib fractures     History  Substance Use Topics  . Smoking status: Never Smoker   . Smokeless tobacco: Never Used  . Alcohol Use: Yes     wine, seldom     Family History  Problem Relation Age of Onset  . Stroke Mother   . Heart disease Father     CAD  . Deep vein thrombosis Brother     AND PTE  . Cancer Other     AUNT INTESTINAL     Allergies  Allergen Reactions  . Percocet (Oxycodone-Acetaminophen) Nausea And Vomiting  . Lansoprazole     ? intolerance    Current outpatient prescriptions:aspirin 81 MG tablet, Take 81 mg by mouth daily.  , Disp: , Rfl: ;  atorvastatin (LIPITOR) 20 MG tablet, 1/2 by mouth once daily **LABS DUE**, Disp: 45 tablet, Rfl: 0;  calcium carbonate (OS-CAL) 600 MG TABS, Take 600 mg by mouth daily. , Disp: , Rfl: ;  Cholecalciferol  (VITAMIN D3) 1000 UNITS CAPS, Take 1,000 Units by mouth daily. , Disp: , Rfl:  Coenzyme Q10 (COQ10) 100 MG CAPS, Take 100 mg by mouth daily.  , Disp: , Rfl: ;  Glucosamine-Chondroit-Vit C-Mn (GLUCOSAMINE 1500 COMPLEX PO), Take 1,500 mg by mouth daily.  , Disp: , Rfl: ;  hydrochlorothiazide (HYDRODIURIL) 25 MG tablet, 1/2 by mouth daily, Disp: 45 tablet, Rfl: 2;  levothyroxine (SYNTHROID, LEVOTHROID) 75 MCG tablet, 1/2 tab daily except 1 on Wed, Disp: 48 tablet, Rfl: 2 LORazepam (ATIVAN) 0.5 MG tablet, Take 1 tablet (0.5 mg total) by mouth every 8 (eight) hours as needed for anxiety., Disp: 30 tablet, Rfl: 1;  losartan (COZAAR) 50 MG tablet, TAKE 1/2 TABLET BY MOUTH ONCE A DAY, Disp: 45 tablet, Rfl: 1;  Multiple Vitamin (MULTIVITAMIN) tablet, Take 1 tablet by mouth daily.  , Disp: , Rfl: ;  Multiple Vitamins-Minerals (OCUVITE PRESERVISION PO), Take by mouth daily.  , Disp: , Rfl:  spironolactone (ALDACTONE) 25 MG tablet, Take 25 mg by mouth as needed. Only takes when BP is over 135/85, Disp: , Rfl: ;  vitamin E 400 UNIT capsule, Take 400 Units by mouth daily.  , Disp: , Rfl:   BP 154/79  Pulse 65  Resp 16  Ht 5\' 4"  (1.626 m)  Wt 132 lb (59.875 kg)  BMI 22.66 kg/m2  Body mass index is 22.66 kg/(m^2).          Review of Systems denies chest pain, dyspnea on exertion, PND, orthopnea, claudication to     Objective:   Physical Exam blood pressure 154/79 heart rate 60 respirations 16 General well-developed well-nourished elderly female in no apparent distress alert and oriented x3 Lungs no rhonchi or wheezing Cardiovascular regular and no murmurs carotid pulses 3+ no bruits Right leg with 3+ femoral and dorsalis pedis pulse palpable. There is evidence of a small eschar on the dorsum of the foot over a small varix where the bleeding occurred. There is no ulceration, infection, or cellulitis surrounding this. No other bulging varicosities are noted.  Today I ordered a venous duplex exam of the  right leg which are reviewed and interpreted. The great saphenous and small saphenous veins are both closed from the previous laser treatment. There are 2 small incompetent perforating branches a low the knee. There is no DVT.     Assessment:     Status post laser ablation right great saphenous and right small saphenous vein with episode of recurrent bleeding from ankle varicosity 2 small incompetent perforators in the right calf    Plan:     Please patient should have sclerotherapy of the area where the bleeding occurred. Will proceed with precertification to perform this in the near future. Discussed at length with patient and her daughter the fact that if bleeding occurs extreme elevation of the leg with compression should take care of this problem and if not she should go to the emergency department

## 2012-02-02 ENCOUNTER — Ambulatory Visit: Payer: Medicare Other | Admitting: Vascular Surgery

## 2012-02-02 ENCOUNTER — Encounter: Payer: Self-pay | Admitting: *Deleted

## 2012-02-03 ENCOUNTER — Encounter: Payer: Self-pay | Admitting: Vascular Surgery

## 2012-02-03 ENCOUNTER — Ambulatory Visit (INDEPENDENT_AMBULATORY_CARE_PROVIDER_SITE_OTHER): Payer: Medicare Other | Admitting: *Deleted

## 2012-02-03 ENCOUNTER — Encounter: Payer: Self-pay | Admitting: *Deleted

## 2012-02-03 DIAGNOSIS — I83893 Varicose veins of bilateral lower extremities with other complications: Secondary | ICD-10-CM

## 2012-02-03 NOTE — Progress Notes (Signed)
X=.3% Sotradecol administered with a 27g butterfly.  Patient received a total of 6cc.  Treated area surrounding bleed site. Varicose veins behind her right knee are larger than they were and cause her pain. Vessels not good candidates for sclerotherapy. Made an appt for her with Dr, Hart Rochester to consider stab phlebectomies. Pt tol sclero well. Will foloow prn. She has one more ins approved sclero treatment.  Photos: yes  Compression stockings applied: yes with ace wrap.

## 2012-02-08 ENCOUNTER — Encounter: Payer: Self-pay | Admitting: Vascular Surgery

## 2012-02-09 ENCOUNTER — Ambulatory Visit (INDEPENDENT_AMBULATORY_CARE_PROVIDER_SITE_OTHER): Payer: Medicare Other | Admitting: Vascular Surgery

## 2012-02-09 ENCOUNTER — Encounter: Payer: Self-pay | Admitting: Vascular Surgery

## 2012-02-09 VITALS — BP 121/75 | HR 72 | Resp 16 | Ht 64.0 in | Wt 132.0 lb

## 2012-02-09 DIAGNOSIS — I83893 Varicose veins of bilateral lower extremities with other complications: Secondary | ICD-10-CM

## 2012-02-09 NOTE — Progress Notes (Signed)
Subjective:     Patient ID: Shelly Ross, female   DOB: 11/11/1924, 78 y.o.   MRN: [id number]  HPI this 78 year old female returns for continued followup regarding her venous insufficiency of the right leg. She had laser ablation of the right great and small saphenous vein was performed about one year ago. She had painful varicosities in the posterior aspect of the right leg as well as near the ankle area. In July of this year she had an episode of bleeding and this was treated with sclerotherapy last week. She continues to have bulging painful varicosities behind the right leg. These are symptomatic despite wearing long leg elastic compression stockings 20-30 mm gradient as well as trying elevation and ibuprofen. She has had no recurrent bleeding from the sclerotherapy site in the right ankle.  Past Medical History  Diagnosis Date  . Chronic low back pain   . Premature ventricular contractions   . Dizziness   . Hypertension   . Neuralgia, neuritis, and radiculitis, unspecified   . Degenerative disc disease     CERVICAL SPINE,W/RADICULOPATHY  . GERD (gastroesophageal reflux disease)   . Hyperlipidemia   . Thyroid disease     HYPOTHYROIDISM  . Osteoporosis   . Rib fractures     History  Substance Use Topics  . Smoking status: Never Smoker   . Smokeless tobacco: Never Used  . Alcohol Use: Yes     wine, seldom     Family History  Problem Relation Age of Onset  . Stroke Mother   . Heart disease Father     CAD  . Deep vein thrombosis Brother     AND PTE  . Cancer Other     AUNT INTESTINAL     Allergies  Allergen Reactions  . Percocet (Oxycodone-Acetaminophen) Nausea And Vomiting  . Lansoprazole     ? intolerance    Current outpatient prescriptions:aspirin 81 MG tablet, Take 81 mg by mouth daily.  , Disp: , Rfl: ;  atorvastatin (LIPITOR) 20 MG tablet, 1/2 by mouth once daily **LABS DUE**, Disp: 45 tablet, Rfl: 0;  calcium carbonate (OS-CAL) 600 MG TABS, Take 600 mg by mouth  daily. , Disp: , Rfl: ;  Cholecalciferol (VITAMIN D3) 1000 UNITS CAPS, Take 1,000 Units by mouth daily. , Disp: , Rfl:  Coenzyme Q10 (COQ10) 100 MG CAPS, Take 100 mg by mouth daily.  , Disp: , Rfl: ;  Glucosamine-Chondroit-Vit C-Mn (GLUCOSAMINE 1500 COMPLEX PO), Take 1,500 mg by mouth daily.  , Disp: , Rfl: ;  hydrochlorothiazide (HYDRODIURIL) 25 MG tablet, 1/2 by mouth daily, Disp: 45 tablet, Rfl: 2;  levothyroxine (SYNTHROID, LEVOTHROID) 75 MCG tablet, 1/2 tab daily except 1 on Wed, Disp: 48 tablet, Rfl: 2 LORazepam (ATIVAN) 0.5 MG tablet, Take 1 tablet (0.5 mg total) by mouth every 8 (eight) hours as needed for anxiety., Disp: 30 tablet, Rfl: 1;  losartan (COZAAR) 50 MG tablet, TAKE 1/2 TABLET BY MOUTH ONCE A DAY, Disp: 45 tablet, Rfl: 1;  Multiple Vitamin (MULTIVITAMIN) tablet, Take 1 tablet by mouth daily.  , Disp: , Rfl: ;  Multiple Vitamins-Minerals (OCUVITE PRESERVISION PO), Take by mouth daily.  , Disp: , Rfl:  spironolactone (ALDACTONE) 25 MG tablet, Take 25 mg by mouth as needed. Only takes when BP is over 135/85, Disp: , Rfl: ;  vitamin E 400 UNIT capsule, Take 400 Units by mouth daily.  , Disp: , Rfl:   BP 121/75  Pulse 72  Resp 16  Ht 5\' 4"  (1.626 m)  Wt 132 lb (59.875 kg)  BMI 22.66 kg/m2  Body mass index is 22.66 kg/(m^2).          Review of Systems denies chest pain, dyspnea on exertion, PND, orthopnea, hemoptysis, claudication, lateralizing weakness.     Objective:   Physical Exam blood pressure 121/75 heart rate 72 respirations 16 General well-developed well-nourished elderly female in no apparent distress alert and oriented x3 Lungs no rhonchi or wheezing Cardiovascular regular rhythm no murmurs Right lower extremity with bulging varicosities beginning in the distal thigh extending posteriorly across the popliteal fossa and into the mid calf area. There are also reticular and spider veins in the ankle area. The site of previous sclerotherapy and near the medial  malleolus is healing nicely plus dorsalis pedis pulses palpable.    Assessment:     Severe venous insufficiency right leg with painful bulging varicosities. Status post laser ablation right great and small saphenous veins and now returns with residual bulging varicosities which are quite symptomatic and not responding to conservative measures    Plan:     Patient needs stab phlebectomy of residual varicosities right leg-10-20 to relieve her symptoms of aching throbbing and burning discomfort. These are affecting her daily living.

## 2012-02-12 ENCOUNTER — Other Ambulatory Visit: Payer: Self-pay | Admitting: Internal Medicine

## 2012-02-12 MED ORDER — ATORVASTATIN CALCIUM 20 MG PO TABS: ORAL_TABLET | ORAL | Status: DC

## 2012-02-12 NOTE — Telephone Encounter (Signed)
Last Lipid Check 07/24/2010. Future orders already placed for patient

## 2012-02-12 NOTE — Telephone Encounter (Signed)
refill Atorvastatin tab 20mg  tablet 90-day supply take 1/2 by mouth daily, last wrt 7.3.13 #45 Last ov 7.23.13 ED follow up---LABS also done on this date

## 2012-02-23 ENCOUNTER — Encounter: Payer: Self-pay | Admitting: Internal Medicine

## 2012-02-26 ENCOUNTER — Encounter: Payer: Self-pay | Admitting: Vascular Surgery

## 2012-02-29 ENCOUNTER — Ambulatory Visit (INDEPENDENT_AMBULATORY_CARE_PROVIDER_SITE_OTHER): Payer: Medicare Other | Admitting: Vascular Surgery

## 2012-02-29 ENCOUNTER — Encounter: Payer: Self-pay | Admitting: Vascular Surgery

## 2012-02-29 VITALS — BP 112/72 | HR 73 | Resp 16 | Ht 64.0 in | Wt 132.0 lb

## 2012-02-29 DIAGNOSIS — I83893 Varicose veins of bilateral lower extremities with other complications: Secondary | ICD-10-CM

## 2012-02-29 NOTE — Progress Notes (Signed)
Subjective:     Patient ID: Shelly Ross, female   DOB: 1924-10-23, 78 y.o.   MRN: [id number]  HPI this 78 year old female had multiple stab phlebectomy (10-20) of the right posterior calf and popliteal area of secondary painful varicosities. She had previously undergone laser ablation of right great and small saphenous veins. This was performed under local tumescent anesthesia.  Review of Systems     Objective:   Physical Exam BP 112/72  Pulse 73  Resp 16  Ht 5\' 4"  (1.626 m)  Wt 132 lb (59.875 kg)  BMI 22.66 kg/m2      Assessment:     Well-tolerated stab phlebectomy right calf and popliteal fossa of secondary painful varicosities    Plan:     Return in 6-8 weeks for followup examination

## 2012-02-29 NOTE — Progress Notes (Signed)
Laser Ablation Procedure      Date: 02/29/2012    Shelly Ross DOB:[birth year]  Consent signed: Yes  Surgeon:J.D. Hart [place]  Procedure:Stab Phlebectomies right leg  BP 112/72  Pulse 73  Resp 16  Ht 5\' 4"  (1.626 m)  Wt 132 lb (59.875 kg)  BMI 22.66 kg/m2  Start time: 9:00   End time: 9:40  Tumescent Anesthesia: 50 cc 0.9% NaCl with 50 cc Lidocaine HCL with 1% Epi and 15 cc 8.4% NaHCO3  Local Anesthesia: 5 cc Lidocaine HCL and NaHCO3 (ratio 2:1)    Stab Phlebectomy: 10-20 Sites: Calf  Patient tolerated procedure well: Yes  Notes:   Description of Procedure:  After marking the varicosities in the standing position, the patient was placed on the operating table in the prone position, and the right leg was prepped and draped in sterile fashion. Local anesthetic was administered. The patient was then put into Trendelenburg position.  Local anesthetic was utilized overlying the marked varicosities.  Ten to twenty stab wounds were made using the tip of an 11 blade; and using the vein hook,  The phlebectomies were performed using a hemostat to avulse these varicosities.  Adequate hemostasis was achieved, and steri strips were applied to the stab wound.    ABD pads and thigh high compression stockings were applied.  Ace wrap bandages were applied over the phlebectomy sites. Blood loss was less than 15 cc.  The patient ambulated out of the operating room having tolerated the procedure well.

## 2012-03-01 ENCOUNTER — Telehealth: Payer: Self-pay | Admitting: *Deleted

## 2012-03-01 ENCOUNTER — Encounter: Payer: Self-pay | Admitting: Vascular Surgery

## 2012-03-01 NOTE — Telephone Encounter (Signed)
Patient doing well. No pain or bleeding. Following all instructions. Reminded her of her next appt.

## 2012-03-07 ENCOUNTER — Telehealth: Payer: Self-pay | Admitting: *Deleted

## 2012-03-07 NOTE — Telephone Encounter (Signed)
Patient's daughter in law had called to ask me to call the patient who complained of pain behind her knee where the stab phlebectomies were done last week. I called her and she described the usual findings after this procedure. I reassured her that what she is feeling is normal. Suggested she continue to wear the compression stockings and try applying heat to the back of the knee. Also suggested she use the [place]nic[place] she purchased from [place]. Will follow prn. Pt seemed reassured.

## 2012-03-14 ENCOUNTER — Telehealth: Payer: Self-pay | Admitting: *Deleted

## 2012-03-14 NOTE — Telephone Encounter (Signed)
Patient called in saying she is having pain in her foot and ankle area and is concerned about it. She has been wearing her compression stocking so can't tell if there is any swelling. I asked her to remove the stocking and see if she has swelling. Once she removed the stocking she saw a hard knot on her ankle where the pain is coming from. I then suggested heat and Ibuprofen and asked her to call me in the morning and let me know about the swelling. She is having localized pain around the knot, not in the calf or higher in the leg. I also offered to make her an appt if she would feel better and she stated she will call me in the am.

## 2012-03-15 ENCOUNTER — Telehealth: Payer: Self-pay | Admitting: *Deleted

## 2012-03-15 NOTE — Telephone Encounter (Signed)
Called to see if the patient had swelling of her foot and ankle after removing the stockings. She states that she noticed swelling in both ankles not just the recently treated leg (where the "knot"is). She thinks that is from her kidney and is seeing a dr about that today. She used ice and Ibuprofen yesterday as I instructed and said she did not have as much discomfort last night. This morning she feels better and does not think she needs to come in. She said she will call me back if things worsen.

## 2012-03-22 ENCOUNTER — Other Ambulatory Visit: Payer: Self-pay | Admitting: Internal Medicine

## 2012-03-28 ENCOUNTER — Encounter (HOSPITAL_COMMUNITY): Payer: Self-pay | Admitting: Emergency Medicine

## 2012-03-28 ENCOUNTER — Emergency Department (HOSPITAL_COMMUNITY)
Admission: EM | Admit: 2012-03-28 | Discharge: 2012-03-29 | Disposition: A | Discharge status: Home / Self Care | Payer: Medicare Other | Attending: Emergency Medicine | Admitting: Emergency Medicine

## 2012-03-28 DIAGNOSIS — E785 Hyperlipidemia, unspecified: Secondary | ICD-10-CM | POA: Insufficient documentation

## 2012-03-28 DIAGNOSIS — R51 Headache: Secondary | ICD-10-CM

## 2012-03-28 DIAGNOSIS — Z8781 Personal history of (healed) traumatic fracture: Secondary | ICD-10-CM | POA: Insufficient documentation

## 2012-03-28 DIAGNOSIS — Z7982 Long term (current) use of aspirin: Secondary | ICD-10-CM | POA: Insufficient documentation

## 2012-03-28 DIAGNOSIS — E039 Hypothyroidism, unspecified: Secondary | ICD-10-CM | POA: Insufficient documentation

## 2012-03-28 DIAGNOSIS — Z79899 Other long term (current) drug therapy: Secondary | ICD-10-CM | POA: Insufficient documentation

## 2012-03-28 DIAGNOSIS — Z8739 Personal history of other diseases of the musculoskeletal system and connective tissue: Secondary | ICD-10-CM | POA: Insufficient documentation

## 2012-03-28 DIAGNOSIS — I10 Essential (primary) hypertension: Secondary | ICD-10-CM

## 2012-03-28 DIAGNOSIS — M810 Age-related osteoporosis without current pathological fracture: Secondary | ICD-10-CM | POA: Insufficient documentation

## 2012-03-28 DIAGNOSIS — K219 Gastro-esophageal reflux disease without esophagitis: Secondary | ICD-10-CM | POA: Insufficient documentation

## 2012-03-28 DIAGNOSIS — IMO0002 Reserved for concepts with insufficient information to code with codable children: Secondary | ICD-10-CM | POA: Insufficient documentation

## 2012-03-28 NOTE — ED Notes (Signed)
Pt states she has high blood pressure and has been out of her medications   Pt states last took her meds about 4 or 5 days ago  Pt states she plans to see her family dr tomorrow

## 2012-03-29 ENCOUNTER — Telehealth: Payer: Self-pay | Admitting: Internal Medicine

## 2012-03-29 MED ORDER — LOSARTAN POTASSIUM 50 MG PO TABS
50.0000 mg | ORAL_TABLET | Freq: Every day | ORAL | Status: DC
  Administered 2012-03-29: 25 mg via ORAL
  Filled 2012-03-29 (×2): qty 1

## 2012-03-29 MED ORDER — LOSARTAN POTASSIUM 50 MG PO TABS: 25.0000 mg | ORAL_TABLET | Freq: Every day | ORAL | Status: DC

## 2012-03-29 MED ORDER — HYDROCHLOROTHIAZIDE 12.5 MG PO CAPS
12.5000 mg | ORAL_CAPSULE | Freq: Once | ORAL | Status: AC
  Administered 2012-03-29: 12.5 mg via ORAL
  Filled 2012-03-29: qty 1

## 2012-03-29 MED ORDER — HYDROCHLOROTHIAZIDE 25 MG PO TABS: ORAL_TABLET | ORAL | Status: DC

## 2012-03-29 NOTE — ED Notes (Signed)
Pt is concerned about her high BP.  Pt is afraid of going to sleep with high BP.

## 2012-03-29 NOTE — ED Provider Notes (Signed)
History     CSN: [id number]  Arrival date & time 03/28/12  2341   First MD Initiated Contact with Patient 03/29/12 0134      Chief Complaint  Patient presents with  . Headache  . Hypertension    (Consider location/radiation/quality/duration/timing/severity/associated sxs/prior treatment) HPI 78 year old female presents to emergency room complaining of high blood pressure. She reports when it was time to go bed, she had a headache with the pain at the very top of her head. She took her blood pressure and reports it was 200s over 90s. She's been out of her blood pressure medicine for last 3-4 days. She was planning on going to see her doctor tomorrow to get a refill. She denies any visual changes, no weakness no numbness no discoordination no dizziness. Headache has resolved since being in the emergency department without treatment. She denies any chest pain, no shortness of breath no abdominal pain no back pain. Blood pressure has improved here in the emergency room without intervention. She reports that she feels nervous to go home and go to sleep with her blood pressure being high. Past Medical History  Diagnosis Date  . Chronic low back pain   . Premature ventricular contractions   . Dizziness   . Hypertension   . Neuralgia, neuritis, and radiculitis, unspecified   . Degenerative disc disease     CERVICAL SPINE,W/RADICULOPATHY  . GERD (gastroesophageal reflux disease)   . Hyperlipidemia   . Thyroid disease     HYPOTHYROIDISM  . Osteoporosis   . Rib fractures     Past Surgical History  Procedure Date  . Abdominal hysterectomy     WITH OOPHORECTOMY,? BILATERAL FOR ENDOMETRIOSIS  . Breast enhancement surgery   . G2 p2   . Bladder tacking   . Endovenous ablation saphenous vein w/ laser 07-13-2011    right greater saphenous vein, Dr Jerilee Field  . Endovenous ablation saphenous vein w/ laser 08/2011    L ; Dr Hart [place]    Family History  Problem Relation Age of Onset  .  Stroke Mother   . Heart disease Father     CAD  . Deep vein thrombosis Brother     AND PTE  . Cancer Other     AUNT INTESTINAL     History  Substance Use Topics  . Smoking status: Never Smoker   . Smokeless tobacco: Never Used  . Alcohol Use: Yes     wine, seldom     OB History    Grav Para Term Preterm Abortions TAB SAB Ect Mult Living                  Review of Systems  All other systems reviewed and are negative.    Allergies  Percocet and Lansoprazole  Home Medications   Current Outpatient Rx  Name Route Sig Dispense Refill  . ASPIRIN 81 MG PO TABS Oral Take 81 mg by mouth daily.     . ATORVASTATIN CALCIUM 20 MG PO TABS Oral Take 10 mg by mouth daily.    Marland Kitchen CALCIUM CARBONATE 600 MG PO TABS Oral Take 600 mg by mouth daily.     Marland Kitchen VITAMIN D3 1000 UNITS PO CAPS Oral Take 1,000 Units by mouth daily.     Marland Kitchen GLUCOSAMINE 1500 COMPLEX PO Oral Take 1,500 mg by mouth daily.      Marland Kitchen LEVOTHYROXINE SODIUM 75 MCG PO TABS Oral Take 37.5-75 mcg by mouth daily. Take 0.5 tablet all days except for  Wednesday take 1 tablet    . ONE-DAILY MULTI VITAMINS PO TABS Oral Take 1 tablet by mouth daily.      Idolina Primer PRESERVISION PO Oral Take by mouth daily.      Marland Kitchen VITAMIN E 400 UNITS PO CAPS Oral Take 400 Units by mouth daily.      Marland Kitchen HYDROCHLOROTHIAZIDE 25 MG PO TABS  1/2 by mouth daily 10 tablet 0  . LORAZEPAM 0.5 MG PO TABS Oral Take 1 tablet (0.5 mg total) by mouth every 8 (eight) hours as needed for anxiety. 30 tablet 1  . LOSARTAN POTASSIUM 50 MG PO TABS Oral Take 0.5 tablets (25 mg total) by mouth daily. 10 tablet 0    BP 172/92  Pulse 68  Temp 97.7 F (36.5 C) (Oral)  Resp 14  SpO2 99%  Physical Exam  Nursing note and vitals reviewed. Constitutional: She is oriented to person, place, and time. She appears well-developed and well-nourished.  HENT:  Head: Normocephalic and atraumatic.  Right Ear: External ear normal.  Left Ear: External ear normal.  Nose: Nose normal.    Mouth/Throat: Oropharynx is clear and moist.  Eyes: Conjunctivae normal and EOM are normal. Pupils are equal, round, and reactive to light.  Neck: Normal range of motion. Neck supple. No JVD present. No tracheal deviation present. No thyromegaly present.  Cardiovascular: Normal rate, regular rhythm, normal heart sounds and intact distal pulses.  Exam reveals no gallop and no friction rub.   No murmur heard. Pulmonary/Chest: Effort normal and breath sounds normal. No stridor. No respiratory distress. She has no wheezes. She has no rales. She exhibits no tenderness.  Abdominal: Soft. Bowel sounds are normal. She exhibits no distension and no mass. There is no tenderness. There is no rebound and no guarding.  Musculoskeletal: Normal range of motion. She exhibits no edema and no tenderness.  Lymphadenopathy:    She has no cervical adenopathy.  Neurological: She is alert and oriented to person, place, and time. She has normal reflexes. No cranial nerve deficit. She exhibits normal muscle tone. Coordination normal.  Skin: Skin is warm and dry. No rash noted. No erythema. No pallor.  Psychiatric: She has a normal mood and affect. Her behavior is normal. Judgment and thought content normal.    ED Course  Procedures (including critical care time)  Labs Reviewed - No data to display No results found.   1. Hypertension   2. Headache       MDM  78 year old female with mildly elevated blood pressure here in the emergency department. We'll give her her routine meds and write short prescription for both. Patient agreeable with plan and is ready to go home       Olivia Mackie, MD 03/29/12 [phone]

## 2012-03-29 NOTE — Telephone Encounter (Signed)
Caller: [name]/Patient; Patient Name: Shelly Ross; PCP: Marga Melnick; Best Callback Phone Number: [phone]; Reason for call: patient calling in follow up from ED visit 03/28/12.  c/o severe headache and BP was 209 "over something."  Had not taken BP medications since 03/24/12 when she ran out of them.  Given a pill of hctz and BP came down;  currently 128/64.  Still c/o headache, but states it is not unbearable.  Per hypertension protocol, emergent symptoms currently denied; appt scheduled 03/30/12 1130 with Dr. Alwyn Ren.

## 2012-03-29 NOTE — Telephone Encounter (Signed)
Spoke with patient, offered appointment for today with Melissa in [place] location. Patient refused and stated she would rather see Dr.Hopper tomorrow. Patient denied CP, SOB, blurred vision, dizziness, syncope feeling or any other symptoms other than Headache. Patient aware if she develops any listed symptoms or headache pain increases patient to seek medical attention at Urgent Care or Emergency Room. Patient verbalized understanding.

## 2012-03-30 ENCOUNTER — Encounter: Payer: Self-pay | Admitting: Internal Medicine

## 2012-03-30 ENCOUNTER — Ambulatory Visit (INDEPENDENT_AMBULATORY_CARE_PROVIDER_SITE_OTHER): Payer: Medicare Other | Admitting: Internal Medicine

## 2012-03-30 VITALS — BP 116/70 | HR 67 | Temp 98.3°F | Wt 130.8 lb

## 2012-03-30 DIAGNOSIS — I10 Essential (primary) hypertension: Secondary | ICD-10-CM

## 2012-03-30 MED ORDER — LOSARTAN POTASSIUM 50 MG PO TABS: 25.0000 mg | ORAL_TABLET | Freq: Every day | ORAL | Status: DC

## 2012-03-30 NOTE — Assessment & Plan Note (Signed)
Accelerated BP with headache in context of disruption in her antihypertensive therapy. Prescription will be written for one year to verify no disruption in medication regimen

## 2012-03-30 NOTE — Progress Notes (Signed)
  Subjective:    Patient ID: Shelly Ross, female    DOB: [birth year], 78 y.o.   MRN: [id number]  HPI   She was seen in emergency room 03/28/12 for headache and elevated blood pressure. She had been off her losartan for at least 4 days. She been told by [facility] that it could not be refilled until December.  In reviewing the encounters in the EMR there is no call from the pharmacy for refill; the angiotensin receptor blocker was refilled last night as a  short-term prescription.    Review of Systems  She has had some epistaxis in the past; this is not persistent. She is not had headaches of the severity of her to the emergency room. She's also not having chest pain, palpitations, or dyspnea.     Objective:   Physical Exam She appears healthy and well-nourished; she is in no acute distress.Appears younger than stated age   No carotid bruits are present.  Heart rhythm and rate are normal with no significant murmurs or gallops.S4 with slurring at  LSB   Chest is clear with no increased work of breathing  There is no evidence of aortic aneurysm or renal artery bruits  She has no clubbing or edema.   Pedal pulses are intact   No ischemic skin changes are present         Assessment & Plan:

## 2012-03-30 NOTE — Patient Instructions (Addendum)
Blood Pressure Goal  Ideally is an AVERAGE < 135/85. This AVERAGE should be calculated from @ least 5-7 BP readings taken @ different times of day on different days of week. You should not respond to isolated BP readings , but rather the AVERAGE for that week.  If you activate My Chart; the results can be released to you as soon as they populate from the lab. If you choose not to use this program; the labs have to be reviewed, copied & mailed   causing a delay in getting the results to you.  

## 2012-04-01 ENCOUNTER — Telehealth: Payer: Self-pay | Admitting: Internal Medicine

## 2012-04-01 MED ORDER — HYDROCHLOROTHIAZIDE 25 MG PO TABS: ORAL_TABLET | ORAL | Status: DC

## 2012-04-01 MED ORDER — LEVOTHYROXINE SODIUM 75 MCG PO TABS: 37.5000 ug | ORAL_TABLET | Freq: Every day | ORAL | Status: DC

## 2012-04-01 NOTE — Telephone Encounter (Signed)
Refill: Levothyroxine tab 75 mcg. Take 1/2 by mouth daily except take 1 by mouth on wednesdays. 90 day supply Hydrochlorot tab 25 mg. Take 1/2 by mouth daily. 90 day supply

## 2012-04-08 ENCOUNTER — Other Ambulatory Visit: Payer: Self-pay

## 2012-04-08 ENCOUNTER — Encounter: Payer: Self-pay | Admitting: Vascular Surgery

## 2012-04-08 DIAGNOSIS — I10 Essential (primary) hypertension: Secondary | ICD-10-CM

## 2012-04-08 MED ORDER — LOSARTAN POTASSIUM 50 MG PO TABS: 25.0000 mg | ORAL_TABLET | Freq: Every day | ORAL | Status: DC

## 2012-04-08 NOTE — Telephone Encounter (Signed)
RX sent to [facility], I called [facility] and cancelled any pending refills (Per patient request )

## 2012-04-11 ENCOUNTER — Ambulatory Visit (INDEPENDENT_AMBULATORY_CARE_PROVIDER_SITE_OTHER): Payer: Medicare Other | Admitting: Vascular Surgery

## 2012-04-11 ENCOUNTER — Encounter: Payer: Self-pay | Admitting: Vascular Surgery

## 2012-04-11 VITALS — BP 143/83 | HR 64 | Resp 16 | Ht 64.5 in | Wt 130.0 lb

## 2012-04-11 DIAGNOSIS — I83893 Varicose veins of bilateral lower extremities with other complications: Secondary | ICD-10-CM

## 2012-04-11 NOTE — Progress Notes (Signed)
Subjective:     Patient ID: Mrytle Bento, female   DOB: [birth year], 78 y.o.   MRN: [id number]  HPI this 78 year old female returns for final followup regarding her multiple stab phlebectomy is in the right leg. She also had laser ablation of the right great and small saphenous veins performed of the past 12 months. She has had bleeding episodes in the past from secondary varicosities but no further bleeding episodes since her last procedure was performed. She denies any pain in the leg and also denies swelling.  Review of Systems     Objective:   Physical ExamBP 143/83  Pulse 64  Resp 16  Ht 5' 4.5" (1.638 m)  Wt 130 lb (58.968 kg)  BMI 21.97 kg/m2  General well-developed well-nourished female in no apparent stress alert and oriented x3 Right lower extremity free of bulging varicosities. Stab phlebectomy sites of healed nicely. 2-3+ dorsalis pedis pulse palpable    Assessment:     Successful laser ablation right great and small saphenous vein with multiple stab phlebectomy for history of bleeding-doing well with no recurrent bleeding or symptoms    Plan:     Return to see [place] on when necessary basis

## 2012-04-13 ENCOUNTER — Telehealth: Payer: Self-pay

## 2012-04-13 NOTE — Telephone Encounter (Signed)
Pt callled LMOVM triage stating wanted Chrae to call her pt states got a problem.  Plz advise pt    MW [phone]

## 2012-04-14 ENCOUNTER — Other Ambulatory Visit: Payer: Self-pay | Admitting: Internal Medicine

## 2012-04-14 NOTE — Telephone Encounter (Signed)
I spoke with patient, patient states she has not received Losartan and ?'s if we sent rx to [facility] already. I informed patient we responded on Friday 04/08/12. Patient stated she will follow-up with mail order company.

## 2012-04-21 ENCOUNTER — Telehealth: Payer: Self-pay

## 2012-04-21 NOTE — Telephone Encounter (Signed)
Son, Ron called about chest pain.  Call transferred to RN, no one on the line.  Attempted to call him back and left a vm.

## 2012-04-21 NOTE — Telephone Encounter (Signed)
Patient is at [facility] surgery(eye surgery) . Patient with irregular heartbeat, pulse drops down in the 30's patient also with faint feeling x several weeks, patient's son was calling per Nurse in outpatient to see what his pcp would recommend patient do for irregular heartbeat  I informed patient's son that patient should be seen in the ER at [facility] for she is already at the facility (different department)

## 2012-04-25 ENCOUNTER — Other Ambulatory Visit: Payer: Self-pay | Admitting: Internal Medicine

## 2012-04-25 DIAGNOSIS — T887XXA Unspecified adverse effect of drug or medicament, initial encounter: Secondary | ICD-10-CM

## 2012-04-25 DIAGNOSIS — E785 Hyperlipidemia, unspecified: Secondary | ICD-10-CM

## 2012-04-25 NOTE — Telephone Encounter (Signed)
refill Atorvastatin (Tab) 20 MG Take 10 mg by mouth daily. #90 last ov 10.30.13

## 2012-04-25 NOTE — Telephone Encounter (Signed)
Left message on voicemail informing patient to return call, patient is OVERDUE for labs. (Future orders already placed)

## 2012-04-26 NOTE — Telephone Encounter (Signed)
Spoke with patient, patient will have labs drawn at [facility] (future orders placed) and then refill will be addressed, patient states she has enough pills to last until labs collected and resulted

## 2012-04-27 ENCOUNTER — Other Ambulatory Visit (INDEPENDENT_AMBULATORY_CARE_PROVIDER_SITE_OTHER): Payer: Medicare Other

## 2012-04-27 DIAGNOSIS — T887XXA Unspecified adverse effect of drug or medicament, initial encounter: Secondary | ICD-10-CM

## 2012-04-27 DIAGNOSIS — E785 Hyperlipidemia, unspecified: Secondary | ICD-10-CM

## 2012-04-27 LAB — HEPATIC FUNCTION PANEL
ALT: 24 U/L (ref 0–35)
Albumin: 3.9 g/dL (ref 3.5–5.2)
Total Bilirubin: 0.9 mg/dL (ref 0.3–1.2)

## 2012-04-27 LAB — LIPID PANEL
HDL: 72.1 mg/dL (ref >39.00)
Triglycerides: 40 mg/dL (ref 0.0–149.0)
VLDL: 8 mg/dL (ref 0.0–40.0)

## 2012-07-06 ENCOUNTER — Telehealth: Payer: Self-pay | Admitting: Internal Medicine

## 2012-07-06 MED ORDER — ATORVASTATIN CALCIUM 20 MG PO TABS: 10.0000 mg | ORAL_TABLET | Freq: Every day | ORAL | Status: DC

## 2012-07-06 NOTE — Telephone Encounter (Signed)
Refill: Atorvastatin tab 20 mg. Take 1/2 by mouth daily (labs due, call for appointment). 90 day supply

## 2012-08-16 ENCOUNTER — Other Ambulatory Visit: Payer: Self-pay | Admitting: Internal Medicine

## 2012-08-21 ENCOUNTER — Emergency Department (HOSPITAL_COMMUNITY)
Admission: EM | Admit: 2012-08-21 | Discharge: 2012-08-21 | Disposition: A | Discharge status: Home / Self Care | Payer: Medicare Other | Attending: Emergency Medicine | Admitting: Emergency Medicine

## 2012-08-21 ENCOUNTER — Emergency Department (HOSPITAL_COMMUNITY): Payer: Medicare Other

## 2012-08-21 ENCOUNTER — Encounter (HOSPITAL_COMMUNITY): Payer: Self-pay | Admitting: Emergency Medicine

## 2012-08-21 DIAGNOSIS — K219 Gastro-esophageal reflux disease without esophagitis: Secondary | ICD-10-CM | POA: Insufficient documentation

## 2012-08-21 DIAGNOSIS — Z8679 Personal history of other diseases of the circulatory system: Secondary | ICD-10-CM | POA: Insufficient documentation

## 2012-08-21 DIAGNOSIS — M545 Low back pain, unspecified: Secondary | ICD-10-CM | POA: Insufficient documentation

## 2012-08-21 DIAGNOSIS — G8929 Other chronic pain: Secondary | ICD-10-CM | POA: Insufficient documentation

## 2012-08-21 DIAGNOSIS — IMO0002 Reserved for concepts with insufficient information to code with codable children: Secondary | ICD-10-CM | POA: Insufficient documentation

## 2012-08-21 DIAGNOSIS — I10 Essential (primary) hypertension: Secondary | ICD-10-CM | POA: Insufficient documentation

## 2012-08-21 DIAGNOSIS — Z79899 Other long term (current) drug therapy: Secondary | ICD-10-CM | POA: Insufficient documentation

## 2012-08-21 DIAGNOSIS — Z9849 Cataract extraction status, unspecified eye: Secondary | ICD-10-CM | POA: Insufficient documentation

## 2012-08-21 DIAGNOSIS — Z8669 Personal history of other diseases of the nervous system and sense organs: Secondary | ICD-10-CM | POA: Insufficient documentation

## 2012-08-21 DIAGNOSIS — E785 Hyperlipidemia, unspecified: Secondary | ICD-10-CM | POA: Insufficient documentation

## 2012-08-21 DIAGNOSIS — E039 Hypothyroidism, unspecified: Secondary | ICD-10-CM | POA: Insufficient documentation

## 2012-08-21 DIAGNOSIS — R200 Anesthesia of skin: Secondary | ICD-10-CM

## 2012-08-21 DIAGNOSIS — M810 Age-related osteoporosis without current pathological fracture: Secondary | ICD-10-CM | POA: Insufficient documentation

## 2012-08-21 DIAGNOSIS — R209 Unspecified disturbances of skin sensation: Secondary | ICD-10-CM | POA: Insufficient documentation

## 2012-08-21 DIAGNOSIS — Z7982 Long term (current) use of aspirin: Secondary | ICD-10-CM | POA: Insufficient documentation

## 2012-08-21 NOTE — ED Notes (Signed)
Pt states that the R side of the back of her head went numb at 0800. No facial droop, arm drift or dysarthira.  Facial sensation intact, no injuries noted to back of head.  Skin in numb area warm and pink.  Pupils equal and reactive.

## 2012-08-21 NOTE — ED Notes (Signed)
Patient transported to CT 

## 2012-08-21 NOTE — ED Provider Notes (Signed)
Medical screening examination/treatment/procedure(s) were conducted as a shared visit with non-physician practitioner(s) and myself.  I personally evaluated the patient during the encounter Pt with numbness to scalp.  No other neuro deficits.  Will d/c to f/u with PMD  Rolan Bucco, MD 08/21/12 1654

## 2012-08-21 NOTE — ED Provider Notes (Signed)
History     CSN: [id number]  Arrival date & time 08/21/12  4098   First MD Initiated Contact with Patient 08/21/12 1008      Chief Complaint  Patient presents with  . Numbness    (Consider location/radiation/quality/duration/timing/severity/associated sxs/prior treatment) HPI Comments: 79 y.o. Female presents today complaining of diminished sensation and numbness to the right occiput since 8am. She awoke this morning to her typical routine, ate breakfast (denies jaw tenderness, difficulty chewing, difficulty swallowing). Noticed this when she was brushing her hair that it felt different on the right side than the left.  Pt denies headache today, although daughter (at beside) states pt told her she had a headache last week which is unusual for her. Headache was not of a thunderclap variety and resolved on its own.  Denies numbness elsewhere, fever, visual disturbances, scalp tenderness, photophobia, difficulty walking, chest pain, shortness of breath, nausea, vomiting, constipation, diarrhea.   Asked pt about "unspecified neuralgia" on her problem list as well as "neoplasm" and "PVCs" and pt was unable to elaborate.   Pt sees Dr. Alwyn Ren but has not had an appointment in some time.    Past Medical History  Diagnosis Date  . Chronic low back pain   . Premature ventricular contractions   . Dizziness   . Hypertension   . Neuralgia, neuritis, and radiculitis, unspecified   . Degenerative disc disease     CERVICAL SPINE,W/RADICULOPATHY  . GERD (gastroesophageal reflux disease)   . Hyperlipidemia   . Thyroid disease     HYPOTHYROIDISM  . Osteoporosis   . Rib fractures     Past Surgical History  Procedure Laterality Date  . Abdominal hysterectomy      WITH OOPHORECTOMY,? BILATERAL FOR ENDOMETRIOSIS  . Breast enhancement surgery    . G2 p2    . Bladder tacking    . Endovenous ablation saphenous vein w/ laser  07-13-2011    right greater saphenous vein, Dr Jerilee Field  .  Endovenous ablation saphenous vein w/ laser  08/2011    L ; Dr Hart Rochester  . Cataract extraction      Family History  Problem Relation Age of Onset  . Stroke Mother   . Heart disease Father     CAD  . Deep vein thrombosis Brother     AND PTE  . Cancer Other     AUNT INTESTINAL     History  Substance Use Topics  . Smoking status: Never Smoker   . Smokeless tobacco: Never Used  . Alcohol Use: Yes     Comment: wine, seldom     OB History   Grav Para Term Preterm Abortions TAB SAB Ect Mult Living                  Review of Systems  Constitutional: Negative for fever and diaphoresis.  HENT: Negative for ear pain, trouble swallowing, neck pain, neck stiffness and tinnitus.        Numbness and decreased sensation to right occiput, no scalp tenderness, no jaw tenderness  Eyes: Negative for photophobia and visual disturbance.  Respiratory: Negative for chest tightness and shortness of breath.   Cardiovascular: Negative for chest pain and palpitations.  Gastrointestinal: Negative for nausea, vomiting, abdominal pain, diarrhea, constipation and abdominal distention.  Genitourinary: Negative for dysuria.  Musculoskeletal: Negative for myalgias, arthralgias and gait problem.  Neurological: Positive for numbness. Negative for dizziness, weakness, light-headedness and headaches.       Right occiput  Allergies  Percocet and Lansoprazole  Home Medications   Current Outpatient Rx  Name  Route  Sig  Dispense  Refill  . aspirin EC 81 MG tablet   Oral   Take 81 mg by mouth daily.         Marland Kitchen atorvastatin (LIPITOR) 20 MG tablet   Oral   Take 0.5 tablets (10 mg total) by mouth daily.   90 tablet   2   . beta carotene w/minerals (OCUVITE) tablet   Oral   Take 1 tablet by mouth daily.         . calcium-vitamin D (OSCAL WITH D) 500-200 MG-UNIT per tablet   Oral   Take 1 tablet by mouth daily.         . cholecalciferol (VITAMIN D) 1000 UNITS tablet   Oral   Take 1,000  Units by mouth daily.         Marland Kitchen CRANBERRY PO   Oral   Take 1 capsule by mouth daily.         . hydrochlorothiazide (HYDRODIURIL) 25 MG tablet   Oral   Take 12.5 mg by mouth daily. 1/2 tablet         . levothyroxine (SYNTHROID, LEVOTHROID) 75 MCG tablet   Oral   Take 37.5 mcg by mouth daily.          Marland Kitchen LORazepam (ATIVAN) 0.5 MG tablet   Oral   Take 1 tablet (0.5 mg total) by mouth every 8 (eight) hours as needed for anxiety.   30 tablet   1   . losartan (COZAAR) 50 MG tablet   Oral   Take 25 mg by mouth daily.         . Multiple Vitamin (MULTIVITAMIN) tablet   Oral   Take 1 tablet by mouth daily.           . vitamin E 400 UNIT capsule   Oral   Take 400 Units by mouth daily.           BP 161/72  Pulse 68  Temp(Src) 97.8 F (36.6 C) (Oral)  Resp 16  SpO2 97%  Physical Exam  Nursing note and vitals reviewed. Constitutional: She is oriented to person, place, and time. She appears well-developed and well-nourished. No distress.  HENT:  Head: Normocephalic and atraumatic.  Eyes: Conjunctivae and EOM are normal. Pupils are equal, round, and reactive to light.  Pt has had cataract surgery, but pupils are still somewhat reactive  Neck: Normal range of motion. Neck supple.  No meningeal signs  Cardiovascular: Normal rate, regular rhythm and normal heart sounds.  Exam reveals no gallop and no friction rub.   No murmur heard. Pulmonary/Chest: Effort normal and breath sounds normal. No respiratory distress. She has no wheezes. She has no rales. She exhibits no tenderness.  Abdominal: Soft. Bowel sounds are normal. She exhibits no distension. There is no tenderness. There is no rebound and no guarding.  Musculoskeletal: Normal range of motion. She exhibits no edema and no tenderness.  No step-offs noted on C-spine. Good strength throughout. Baseline mild diminished strength on left upper extremity due to possible rotator cuff tear pt has not follow up on. Good  grip strength.   Neurological: She is alert and oriented to person, place, and time. No cranial nerve deficit.  No focal deficits. No arm drift. No facial droop. Speech is clear and goal oriented, follows commands Sensation normal to light and sharp touch Coordination intact   Skin: Skin  is warm and dry. She is not diaphoretic. No erythema.  Psychiatric: She has a normal mood and affect.    ED Course  Procedures (including critical care time)  Labs Reviewed - No data to display Ct Head Wo Contrast  08/21/2012  *RADIOLOGY REPORT*  Clinical Data: Right-sided numbness  CT HEAD WITHOUT CONTRAST  Technique:  Contiguous axial images were obtained from the base of the skull through the vertex without contrast.  Comparison: None.  Findings: No acute intracranial hemorrhage.  No focal mass lesion. No CT evidence of acute infarction.   No midline shift or mass effect.  No hydrocephalus.  Basilar cisterns are patent.  There is mild periventricular subcortical white matter hypodensities.  Mild cortical atrophy.  Paranasal sinuses and mastoid air cells are clear.  Orbits are normal.  IMPRESSION:  1.  No acute intracranial findings.  2 . Atrophy and microvascular disease.   Original Report Authenticated By: Genevive Bi, M.D.      1. Numbness       MDM  Low suspicion of stroke, SAH, or giant cell arteritis given lack of other symptoms. Pt concern is for brain tumor. Discussed with Dr. Fredderick Phenix who agrees to CT scan of head to provide re-assurance. In the event of negative findings, will direct pt to follow up with primary care doctor.   CT scan shows no acute intracranial findings.  At this time there does not appear to be any evidence of an acute emergency medical condition and the patient appears stable for discharge with appropriate outpatient follow up. Diagnosis was discussed with patient who verbalizes understanding and is agreeable to discharge. Pt case discussed with Dr. Fredderick Phenix who agrees with my  plan.    Kamea Dacosta, PA-C 08/21/12 1340

## 2012-08-23 ENCOUNTER — Encounter: Payer: Self-pay | Admitting: Lab

## 2012-08-24 ENCOUNTER — Encounter: Payer: Self-pay | Admitting: Internal Medicine

## 2012-08-24 ENCOUNTER — Ambulatory Visit (INDEPENDENT_AMBULATORY_CARE_PROVIDER_SITE_OTHER): Payer: Medicare Other | Admitting: Internal Medicine

## 2012-08-24 VITALS — BP 120/80 | HR 66 | Temp 97.6°F | Wt 133.0 lb

## 2012-08-24 DIAGNOSIS — R413 Other amnesia: Secondary | ICD-10-CM

## 2012-08-24 DIAGNOSIS — R209 Unspecified disturbances of skin sensation: Secondary | ICD-10-CM

## 2012-08-24 NOTE — Patient Instructions (Addendum)
Review and correct the record as indicated. Please share record with all medical staff seen.[id number]

## 2012-08-24 NOTE — Progress Notes (Signed)
Subjective:    Patient ID: Shelly Ross, female    DOB: 1924-07-25, 79 y.o.   MRN: [id number]  HPI The emergency room records 08/21/12 were reviewed. She presented with numbness over the right posterior temporal occipital area described as "feeling like leather". CT scan revealed no acute intracranial findings. Age-related atrophy and microvascular disease were noted  The symptoms resolved within 24 hours without definitive treatment.  She states that her concern was having an experience of in-laws with brain tumors.  A brother and sister and Alzheimer's. Her mother had a stroke at 71.  Her daughter is inquiring about an agent to prevent progressive memory loss. She does misplace items. She does not get lost.      Review of Systems She denied associated headache, blurred vision, double vision, or loss of vision. She also denied tinnitus or hearing loss. There was no associated photophobia or phonophobia. There was no  lightheadedness or vertigo. There was no specific exacerbating factor such as  position change such as turning in bed ; arising from bed / chair; neck rotation/ extension. Symptoms were not related to straining ,pain, cardiac or neurologic prodrome. No seizure activity or syncope described.     Objective:   Physical Exam  Gen. appearance: Well-nourished, in no distress.Appears younger than stated age Eyes: Extraocular motion intact, field of vision normal, vision grossly intact, no nystagmus ENT: Hearing aids bilaterally.Some wax on R, tympanic membranes normal. Neck: Normal range of motion, no masses, normal thyroid Cardiovascular: Rate and rhythm normal; no gallops or extra heart sounds.Grade 1/2 over 6 systolic murmur Muscle skeletal: Tone &  strength normal Neuro:Oriented X3.No cranial nerve deficit, deep tendon  reflexes normal. Some limping due to knee pain. Normal facial & R scalp sensation Lymph: No cervical or axillary LA Skin: Warm and dry without  suspicious lesions or rashes Psych: no anxiety or mood change. Normally interactive and cooperative.                                                                                                                                                                           Assessment: #1Transient numbness R temporo - occipital area  #2 Some memory issues; historically these seem physiologic Plan: continue ASA 81 mg daily       Neurology referral

## 2012-08-25 LAB — VITAMIN B12: Vitamin B-12: 1167 pg/mL — ABNORMAL HIGH (ref 211–911)

## 2012-08-25 LAB — TSH: TSH: 3.48 u[IU]/mL (ref 0.35–5.50)

## 2012-09-08 ENCOUNTER — Telehealth: Payer: Self-pay | Admitting: *Deleted

## 2012-09-08 NOTE — Telephone Encounter (Signed)
RN at pt's house called stating that he pt took her BP & it was 74/40. Pt pressed her "alert button" & that's when the RN came to her home. The RN took her BP & it was 84/60, RN took it again & it was 100/60. Per Dr. Alwyn Ren, recommend for pt to monitor her stool & hold losartan & HCTZ every 12 hrs & to call the office tomorrow with BP readings.

## 2012-09-09 ENCOUNTER — Telehealth: Payer: Self-pay | Admitting: *Deleted

## 2012-09-09 ENCOUNTER — Ambulatory Visit (INDEPENDENT_AMBULATORY_CARE_PROVIDER_SITE_OTHER): Payer: Medicare Other | Admitting: Diagnostic Neuroimaging

## 2012-09-09 ENCOUNTER — Encounter: Payer: Self-pay | Admitting: Diagnostic Neuroimaging

## 2012-09-09 ENCOUNTER — Encounter: Payer: Self-pay | Admitting: Internal Medicine

## 2012-09-09 VITALS — BP 144/74 | HR 65 | Temp 97.7°F | Ht 64.5 in | Wt 130.5 lb

## 2012-09-09 DIAGNOSIS — R200 Anesthesia of skin: Secondary | ICD-10-CM

## 2012-09-09 DIAGNOSIS — R209 Unspecified disturbances of skin sensation: Secondary | ICD-10-CM

## 2012-09-09 DIAGNOSIS — R413 Other amnesia: Secondary | ICD-10-CM

## 2012-09-09 NOTE — Patient Instructions (Signed)
I will check MRI brain and vitamin B12 level. Please be careful with driving.

## 2012-09-09 NOTE — Telephone Encounter (Signed)
Pt calling to report BP this AM 135/75

## 2012-09-09 NOTE — Progress Notes (Signed)
[facility]  PATIENT: Shelly Ross DOB: 07-Nov-1924  REFERRING CLINICIAN: Hopper HISTORY FROM: patient and daughter REASON FOR VISIT:  New consult   HISTORICAL  CHIEF COMPLAINT:  Chief Complaint  Patient presents with  . Memory Loss    HISTORY OF PRESENT ILLNESS:   79 year old right-handed female with hypercholesterolemia, hypertension, hypothyroidism, here for evaluation of right scalp numbness and memory issues.  08/21/2012, patient had cold her hair and felt numbness in her right parietal scalp region. Patient went to the emergency room for further evaluation. CT scan of the head was unremarkable. Symptoms resolved within 24 hours.  Separately patient's family have noted progressive short-term memory loss, misplacing objects, asking the same questions over and over her general memory problems. Patient does not think she has any significant memory problems out of proportion to her age or situation. Patient continues to live independently, drives, shops, without difficulty.  There is family history for Alzheimer's disease the patient's brother and sister. Patient's 2 sister in-laws also deceased from brain tumors. Therefore family and patient are concerned about these diagnoses as a possibility.  REVIEW OF SYSTEMS: Full 14 system review of systems performed and notable only for swelling in the legs hearing loss blurred vision constipation easy bruising joint pain joint swelling cramps aching muscles allergies runny nose decreased energy disinterest in activities depression numbness memory loss insomnia.  ALLERGIES: Allergies  Allergen Reactions  . Percocet (Oxycodone-Acetaminophen) Nausea And Vomiting  . Lansoprazole Other (See Comments)    Induced nausea and possibly headaches    HOME MEDICATIONS: Outpatient Prescriptions Prior to Visit  Medication Sig Dispense Refill  . aspirin EC 81 MG tablet Take 81 mg by mouth daily.      Marland Kitchen atorvastatin (LIPITOR)  20 MG tablet Take 0.5 tablets (10 mg total) by mouth daily.  90 tablet  2  . beta carotene w/minerals (OCUVITE) tablet Take 1 tablet by mouth daily.      . calcium-vitamin D (OSCAL WITH D) 500-200 MG-UNIT per tablet Take 1 tablet by mouth daily.      . cholecalciferol (VITAMIN D) 1000 UNITS tablet Take 1,000 Units by mouth daily.      . hydrochlorothiazide (HYDRODIURIL) 25 MG tablet Take 12.5 mg by mouth daily. 1/2 tablet      . levothyroxine (SYNTHROID, LEVOTHROID) 75 MCG tablet Take 37.5 mcg by mouth daily.       Marland Kitchen LORazepam (ATIVAN) 0.5 MG tablet Take 1 tablet (0.5 mg total) by mouth every 8 (eight) hours as needed for anxiety.  30 tablet  1  . losartan (COZAAR) 50 MG tablet Take 25 mg by mouth daily.      . Multiple Vitamin (MULTIVITAMIN) tablet Take 1 tablet by mouth daily.        . vitamin E 400 UNIT capsule Take 400 Units by mouth daily.       No facility-administered medications prior to visit.    PAST MEDICAL HISTORY: Past Medical History  Diagnosis Date  . Chronic low back pain   . Premature ventricular contractions   . Dizziness   . Hypertension   . Neuralgia, neuritis, and radiculitis, unspecified   . Degenerative disc disease     CERVICAL SPINE,W/RADICULOPATHY  . GERD (gastroesophageal reflux disease)   . Hyperlipidemia   . Thyroid disease     HYPOTHYROIDISM  . Osteoporosis   . Rib fractures   . Numbness 08/21/12     [facility]   . Atrial fibrillation     PAST  SURGICAL HISTORY: Past Surgical History  Procedure Laterality Date  . Abdominal hysterectomy      WITH OOPHORECTOMY,? BILATERAL FOR ENDOMETRIOSIS  . Breast enhancement surgery    . G2 p2    . Bladder tacking    . Endovenous ablation saphenous vein w/ laser  07-13-2011    right greater saphenous vein, Dr Jerilee Field  . Endovenous ablation saphenous vein w/ laser  08/2011    L ; Dr Hart Rochester  . Cataract extraction      FAMILY HISTORY: Family History  Problem Relation Age of Onset  . Stroke Mother 16   . Heart disease Father     CAD  . Deep vein thrombosis Brother     AND PTE  . Cancer Other     AUNT INTESTINAL   . Alzheimer's disease Sister   . Alzheimer's disease Brother   . Tuberculosis Brother   . Aneurysm    . Heart Problems    . COPD      smoking    SOCIAL HISTORY:  History   Social History  . Marital Status: Widowed    Spouse Name: N/A    Number of Children: 2  . Years of Education: 12th   Occupational History  . Retired     [employer]   Social History Main Topics  . Smoking status: Never Smoker   . Smokeless tobacco: Never Used  . Alcohol Use: Yes     Comment: wine, seldom   . Drug Use: No  . Sexually Active: Not on file   Other Topics Concern  . Not on file   Social History Narrative      Pt lives in assisted living.    Caffeine Use- Very little.     PHYSICAL EXAM  Filed Vitals:   09/09/12 1143  BP: 144/74  Pulse: 65  Temp: 97.7 F (36.5 C)  TempSrc: Oral  Height: 5' 4.5" (1.638 m)  Weight: 130 lb 8 oz (59.194 kg)   Body mass index is 22.06 kg/(m^2).  GENERAL EXAM: Patient is in no distress  CARDIOVASCULAR: Regular rate and rhythm, no murmurs, no carotid bruits  NEUROLOGIC: MENTAL STATUS: awake, alert, language fluent, comprehension intact, naming intact; MMSE 25/30. BORDERLINE PALMOMENTAL AND SNOUT, NEG MYERSONS. GDS 4. CRANIAL NERVE: no papilledema on fundoscopic exam, pupils equal and reactive to light, visual fields full to confrontation, extraocular muscles intact, no nystagmus, facial sensation and strength symmetric, uvula midline, shoulder shrug symmetric, tongue midline. MOTOR: normal bulk and tone, full strength in the BUE, BLE SENSORY: normal and symmetric to light touch, pinprick, temperature; ABSENT VIB AT TOES. COORDINATION: finger-nose-finger, fine finger movements normal REFLEXES: deep tendon reflexes TRACE and symmetric GAIT/STATION: SLOW CAUTIOUS GAIT.  DIAGNOSTIC DATA (LABS,  IMAGING, TESTING) - I reviewed patient records, labs, notes, testing and imaging myself where available.  Lab Results  Component Value Date   WBC 7.3 12/24/2011   HGB 14.3 12/24/2011   HCT 42.0 12/24/2011   MCV 91.5 12/24/2011   PLT 259 12/24/2011      Component Value Date/Time   NA 141 12/24/2011 1349   K 3.8 12/24/2011 1349   CL 103 12/24/2011 1349   CO2 30 12/24/2011 1349   GLUCOSE 91 12/24/2011 1349   BUN 16 12/24/2011 1349   CREATININE 0.68 12/24/2011 1349   CALCIUM 10.3 12/24/2011 1349   PROT 7.2 04/27/2012 1104   ALBUMIN 3.9 04/27/2012 1104   AST 25 04/27/2012 1104   ALT 24  04/27/2012 1104   ALKPHOS 57 04/27/2012 1104   BILITOT 0.9 04/27/2012 1104   GFRNONAA 78* 12/24/2011 1349   GFRAA 90* 12/24/2011 1349   Lab Results  Component Value Date   CHOL 167 04/27/2012   HDL 72.10 04/27/2012   LDLCALC 87 04/27/2012   TRIG 40.0 04/27/2012   CHOLHDL 2 04/27/2012   No results found for this basename: HGBA1C   Lab Results  Component Value Date   VITAMINB12 1167* 08/24/2012   Lab Results  Component Value Date   TSH 3.48 08/24/2012      ASSESSMENT AND PLAN  79 y.o. year old female  has a past medical history of Chronic low back pain; Premature ventricular contractions; Dizziness; Hypertension; Neuralgia, neuritis, and radiculitis, unspecified; Degenerative disc disease; GERD (gastroesophageal reflux disease); Hyperlipidemia; Thyroid disease; Osteoporosis; Rib fractures; Numbness (08/21/12); and Atrial fibrillation. here with 24 absent right scalp numbness of unclear etiology. Could represent migraine phenomenon, occipital neuralgia, less likely into. Abnormality. Patient memory loss may be a different issue, possibly mild cognitive impairment versus mild dementia. I will check MRI of the brain and vitamin B12 level. May consider trial of donepezil. May also consider enrollment into EXPEDITION 3 research study.   Orders Placed This Encounter  Procedures  . MR Brain W Wo Contrast  .  Vitamin B12    Suanne Marker, MD 09/09/2012, 12:00 PM Certified in Neurology, Neurophysiology and Neuroimaging  [facility] [address], [address] [place], [place] [address] [phone]

## 2012-09-09 NOTE — Telephone Encounter (Signed)
Stay off BP meds.Minimal Blood Pressure Goal= AVERAGE < 140/90;  Ideal is an AVERAGE < 135/85. This AVERAGE should be calculated from @ least 5-7 BP readings taken @ different times of day on different days of week. OV if BP averages > 140/90.

## 2012-09-09 NOTE — Telephone Encounter (Signed)
Left message on VM for patient with Dr.Hopper's instructions. Message will also be sent through MyChart

## 2012-09-10 LAB — VITAMIN B12: Vitamin B-12: >1999 pg/mL — ABNORMAL HIGH (ref 211–946)

## 2012-09-21 ENCOUNTER — Telehealth: Payer: Self-pay | Admitting: Internal Medicine

## 2012-09-21 MED ORDER — LEVOTHYROXINE SODIUM 75 MCG PO TABS: 37.5000 ug | ORAL_TABLET | Freq: Every day | ORAL | Status: DC

## 2012-09-21 NOTE — Telephone Encounter (Signed)
LEVOTHYROX QTY:90 TAKE 1/2 BY MOUTH ALL DAYS EXCEPT TAKE 1 BY MOUTH ON Wednesday -SUBSTITUTED FOR SYNTHROID

## 2012-09-21 NOTE — Telephone Encounter (Signed)
Spoke with patient to verify how she is taking medication, patient states 1/2 by mouth daily except 1 on Wed

## 2012-09-27 ENCOUNTER — Telehealth: Payer: Self-pay | Admitting: General Practice

## 2012-09-27 NOTE — Telephone Encounter (Signed)
B12 level is supranormal; no oral B12 supplementation needed if being taken

## 2012-09-27 NOTE — Telephone Encounter (Signed)
Pt.notified

## 2012-09-27 NOTE — Telephone Encounter (Signed)
Pt called and stated her son was looking at latest B12 results from [facility] 09/09/12 and wanted to know if they should be concerned with B12 levels. Also would like to know when next appt needs to be made with Dr. Alwyn Ren. Please advise.

## 2012-09-28 ENCOUNTER — Ambulatory Visit
Admission: RE | Admit: 2012-09-28 | Discharge: 2012-09-28 | Disposition: A | Discharge status: Home / Self Care | Payer: Medicare Other | Source: Ambulatory Visit | Attending: Diagnostic Neuroimaging | Admitting: Diagnostic Neuroimaging

## 2012-09-28 DIAGNOSIS — R200 Anesthesia of skin: Secondary | ICD-10-CM

## 2012-09-28 DIAGNOSIS — R413 Other amnesia: Secondary | ICD-10-CM

## 2012-09-28 DIAGNOSIS — R209 Unspecified disturbances of skin sensation: Secondary | ICD-10-CM

## 2012-09-28 MED ORDER — GADOBENATE DIMEGLUMINE 529 MG/ML IV SOLN
12.0000 mL | Freq: Once | INTRAVENOUS | Status: AC | PRN
  Administered 2012-09-28: 12 mL via INTRAVENOUS

## 2012-10-04 ENCOUNTER — Ambulatory Visit (INDEPENDENT_AMBULATORY_CARE_PROVIDER_SITE_OTHER): Payer: Medicare Other | Admitting: Internal Medicine

## 2012-10-04 ENCOUNTER — Encounter: Payer: Self-pay | Admitting: Internal Medicine

## 2012-10-04 VITALS — BP 114/68 | HR 65 | Wt 130.0 lb

## 2012-10-04 DIAGNOSIS — R1032 Left lower quadrant pain: Secondary | ICD-10-CM

## 2012-10-04 DIAGNOSIS — R413 Other amnesia: Secondary | ICD-10-CM

## 2012-10-04 MED ORDER — DONEPEZIL HCL 5 MG PO TABS: 5.0000 mg | ORAL_TABLET | Freq: Every evening | ORAL | Status: DC | PRN

## 2012-10-04 NOTE — Progress Notes (Signed)
  Subjective:    Patient ID: Shelly Ross, female    DOB: 03-10-1925, 79 y.o.   MRN: [id number]  HPI   She is having a left lower quadrant pain for possibly one month. He does radiate to the left flank and intermittently into the leg. It is described as intermittent and  variable in character. Initially she described it  as throbbing but subsequently described it as aching, up to a level VIII.  She's not treated this to date.  She denies weight loss, melena, or rectal bleeding. She has no associated fever, chills, or sweats.  There's been no dysuria, hematuria, or pyuria.  Past medical history reveals colonoscopy remotely which was negative for polyps. She's had her esophagus dilated on one occasion.   Review of Systems  The neurology office visit 09/09/12, MRI, B12 level, and Mini-Mental status report were reviewed. The Mini-Mental status score was 23/30. The MRI revealed chronic small vessel ischemic disease. B12 level was super normal at 1999. Options to be considered were generic Aricept versus entry into a trial. The neurologist also mentioned possible PET scan; this would not be covered by her insurance. Copies of reports provided     Objective:   Physical Exam General appearance is one of good health and nourishment w/o distress.  Eyes: No conjunctival inflammation or scleral icterus is present.  Oral exam: Dental hygiene is good; lips and gums are healthy appearing.There is no oropharyngeal erythema or exudate noted.   Heart:  Normal rate and regular rhythm. S1 and S2 normal without gallop, murmur, click, rub or other extra sounds. S4   Lungs:Chest clear to auscultation; no wheezes, rhonchi,rales ,or rubs present.No increased work of breathing.   Abdomen: bowel sounds normal, soft and non-tender without masses, organomegaly or hernias noted.  No guarding or rebound   Musculoskeletal: She is able to lie flat and sit up without help. Straight leg raising is negative.  She  has crepitus in fusiform enlargement of her knees with decreased range of motion. Straight leg raising is negative bilaterally.  Skin:Warm & dry.  Intact without suspicious lesions or rashes ; no jaundice or tenting  Neuro: Deep tendon reflexes 1+ and equal. Strength and tone good. "September 04, 1912... No 2014"  Lymphatic: No lymphadenopathy is noted about the head, neck, axilla, or inguinal areas.   Affect flat. History somewhat vague & variable.             Assessment & Plan:  #1 abdominal pain with left flank and lower extremity radiation  #2 memory deficit  Plan: See orders and recommendations

## 2012-10-04 NOTE — Patient Instructions (Addendum)
Please complete and return stool cards; these will determine whether there is any gastrointestinal bleeding risk. 

## 2012-10-05 LAB — CBC WITH DIFFERENTIAL/PLATELET
Basophils Absolute: 0 10*3/uL (ref 0.0–0.1)
Basophils Relative: 0.4 % (ref 0.0–3.0)
Eosinophils Absolute: 0.4 10*3/uL (ref 0.0–0.7)
Lymphocytes Relative: 35 % (ref 12.0–46.0)
MCHC: 34.2 g/dL (ref 30.0–36.0)
MCV: 91.2 fl (ref 78.0–100.0)
Monocytes Absolute: 0.6 10*3/uL (ref 0.1–1.0)
Neutrophils Relative %: 51.2 % (ref 43.0–77.0)
Platelets: 270 10*3/uL (ref 150.0–400.0)
RBC: 4.77 Mil/uL (ref 3.87–5.11)

## 2012-10-06 LAB — POCT URINALYSIS DIPSTICK
Blood, UA: NEGATIVE
Glucose, UA: NEGATIVE
Nitrite, UA: NEGATIVE
Protein, UA: NEGATIVE
Spec Grav, UA: 1.015
Urobilinogen, UA: 0.2

## 2012-10-07 ENCOUNTER — Telehealth: Payer: Self-pay | Admitting: Diagnostic Neuroimaging

## 2012-10-25 ENCOUNTER — Other Ambulatory Visit (INDEPENDENT_AMBULATORY_CARE_PROVIDER_SITE_OTHER): Payer: Medicare Other

## 2012-10-25 DIAGNOSIS — Z0000 Encounter for general adult medical examination without abnormal findings: Secondary | ICD-10-CM

## 2012-10-25 DIAGNOSIS — Z1289 Encounter for screening for malignant neoplasm of other sites: Secondary | ICD-10-CM

## 2012-10-25 DIAGNOSIS — Z1211 Encounter for screening for malignant neoplasm of colon: Secondary | ICD-10-CM

## 2012-10-25 LAB — HEMOCCULT GUIAC POC 1CARD (OFFICE)
Card #2 Fecal Occult Blod, POC: NEGATIVE
Fecal Occult Blood, POC: NEGATIVE

## 2012-10-31 ENCOUNTER — Ambulatory Visit (INDEPENDENT_AMBULATORY_CARE_PROVIDER_SITE_OTHER): Payer: Medicare Other | Admitting: Internal Medicine

## 2012-10-31 ENCOUNTER — Encounter: Payer: Self-pay | Admitting: Internal Medicine

## 2012-10-31 VITALS — BP 114/70 | HR 63 | Temp 98.2°F | Wt 130.0 lb

## 2012-10-31 DIAGNOSIS — L0390 Cellulitis, unspecified: Secondary | ICD-10-CM

## 2012-10-31 MED ORDER — CEPHALEXIN 500 MG PO CAPS: 500.0000 mg | ORAL_CAPSULE | Freq: Four times a day (QID) | ORAL | Status: DC

## 2012-10-31 NOTE — Patient Instructions (Addendum)
Take the antibiotic as prescribed for one week. Keep leg elevated and put ice on the ankl Call anytime if the red area starts to  get larger, you have increased pain or fever. Call if not gradually better in the next few days

## 2012-10-31 NOTE — Progress Notes (Signed)
  Subjective:    Patient ID: Shelly Ross, female    DOB: 10/29/1924, 79 y.o.   MRN: [id number]  HPI Acute visit 3 days history of redness, swelling and tenderness at the right ankle. She thinks that the left ankle is also slightly swollen. She does not recall any injury or insect bite. She has been putting ice on the ankle andt feels slightly less swollen today  Past Medical History  Diagnosis Date  . Chronic low back pain   . Premature ventricular contractions   . Dizziness   . Hypertension   . Neuralgia, neuritis, and radiculitis, unspecified   . Degenerative disc disease     CERVICAL SPINE,W/RADICULOPATHY  . GERD (gastroesophageal reflux disease)   . Hyperlipidemia   . Thyroid disease     HYPOTHYROIDISM  . Osteoporosis   . Rib fractures   . Numbness 08/21/12     [facility]   . Atrial fibrillation    Past Surgical History  Procedure Laterality Date  . Abdominal hysterectomy      WITH OOPHORECTOMY,? BILATERAL FOR ENDOMETRIOSIS  . Breast enhancement surgery    . G2 p2    . Bladder tacking    . Endovenous ablation saphenous vein w/ laser  07-13-2011    right greater saphenous vein, Dr Jerilee Field  . Endovenous ablation saphenous vein w/ laser  08/2011    L ; Dr Hart Rochester  . Cataract extraction    . Esophageal dilation       X 1  . Colonoscopy     History   Social History  . Marital Status: Widowed    Spouse Name: N/A    Number of Children: 2  . Years of Education: 12th   Occupational History  . Retired     [employer]   Social History Main Topics  . Smoking status: Never Smoker   . Smokeless tobacco: Never Used  . Alcohol Use: Yes     Comment: wine, seldom   . Drug Use: No  . Sexually Active: Not on file   Other Topics Concern  . Not on file   Social History Narrative      Pt lives in assisted living.    Caffeine Use- Very little.    Review of Systems No fever chills. No history of gout No recent airplane trip  or  prolonged car trip. No chest pain or shortness of breath. No injury or fall.     Objective:   Physical Exam  Constitutional: She is oriented to person, place, and time. She appears well-developed and well-nourished. No distress.  Musculoskeletal:       Feet:  Patient has good pedal pulses bilaterally. Left ankle normal. Right ankle range of motion normal, only the lateral aspect of the ankle is red and swollen, the inner aspect is normal and not tender to palpation. calves are measured : symmetric and not tender.   Neurological: She is alert and oriented to person, place, and time.  Skin: She is not diaphoretic.  Psychiatric: She has a normal mood and affect. Judgment and thought content normal.          Assessment & Plan:  Cellulitis, Redness likely cellulitis, I wonder about a insect bite. Pt states area is looking better today than it did yesterday. There is no clear clinical evidence of  Monoarthritis @ the ankle, phlebitis or deep tissue infection. Plan: Keflex, see instructions.

## 2012-11-03 ENCOUNTER — Telehealth: Payer: Self-pay | Admitting: *Deleted

## 2012-11-03 NOTE — Telephone Encounter (Signed)
Shelly Plump, MD - seen with cellulitis, please check on her, better? lmovm for pt to return call.

## 2012-11-04 ENCOUNTER — Other Ambulatory Visit: Payer: Self-pay | Admitting: Internal Medicine

## 2012-11-09 ENCOUNTER — Other Ambulatory Visit: Payer: Self-pay | Admitting: General Practice

## 2012-11-09 ENCOUNTER — Telehealth: Payer: Self-pay | Admitting: General Practice

## 2012-11-09 NOTE — Telephone Encounter (Signed)
Losartan 50 mg # 90. Minimal Blood Pressure Goal= AVERAGE < 140/90;  Ideal is an AVERAGE < 135/85. This AVERAGE should be calculated from @ least 5-7 BP readings taken @ different times of day on different days of week. You should not respond to isolated BP readings , but rather the AVERAGE for that week .Please bring your  blood pressure cuff to office visits to verify that it is reliable.It  can also be checked against the blood pressure device at the pharmacy. Finger or wrist cuffs are not dependable; an arm cuff is.

## 2012-11-09 NOTE — Telephone Encounter (Signed)
Pt called to get her Losartan refilled to [facility]. Please advise if pt should be on one pill or 1/2 pill daily (Sig has both). Medication is listed as being started at ED.

## 2012-11-10 MED ORDER — LOSARTAN POTASSIUM 50 MG PO TABS: 50.0000 mg | ORAL_TABLET | Freq: Every day | ORAL | Status: DC

## 2012-11-10 NOTE — Telephone Encounter (Signed)
Med filled.  

## 2012-11-11 ENCOUNTER — Telehealth: Payer: Self-pay | Admitting: Internal Medicine

## 2012-11-11 NOTE — Telephone Encounter (Signed)
Patient was given Keflex for her feet swelling and wants to know if this can be refilled. She says that she is out of the antibiotic and it was working well for her feet while taking it.

## 2012-11-11 NOTE — Telephone Encounter (Signed)
Left Pt detail VM with Dr Alwyn Ren instruction and advise Pt if she would like to be seen tomorrow she can give [place] a call back and she can be added to Saturday clinic if not give [place] a call on Monday and we can get her in to see Hopp.

## 2012-11-11 NOTE — Telephone Encounter (Signed)
I am sorry that reevaluation would be necessary; recurrent antibiotics can be associated with significant complications such as bacterial overgrowth colitis which can be helped threatening.

## 2012-11-14 ENCOUNTER — Ambulatory Visit (INDEPENDENT_AMBULATORY_CARE_PROVIDER_SITE_OTHER): Payer: Medicare Other | Admitting: Internal Medicine

## 2012-11-14 ENCOUNTER — Encounter: Payer: Self-pay | Admitting: Internal Medicine

## 2012-11-14 VITALS — BP 96/68 | HR 62 | Temp 98.0°F | Wt 131.0 lb

## 2012-11-14 DIAGNOSIS — L0390 Cellulitis, unspecified: Secondary | ICD-10-CM

## 2012-11-14 DIAGNOSIS — G609 Hereditary and idiopathic neuropathy, unspecified: Secondary | ICD-10-CM

## 2012-11-14 NOTE — Progress Notes (Signed)
  Subjective:    Patient ID: Shelly Ross, female    DOB: [birth year], 79 y.o.   MRN: [id number]  HPI Here with her son to discuss the following problems: Was seen with cellulitis, prescribe Keflex, is better but today he reports burning in her feet bilaterally for a while, worse at night. Symptoms are more noticeable since she was diagnosed with cellulitis so she felt like another rounds of antibiotics may  help.  Past Medical History  Diagnosis Date  . Chronic low back pain   . Premature ventricular contractions   . Dizziness   . Hypertension   . Neuralgia, neuritis, and radiculitis, unspecified   . Degenerative disc disease     CERVICAL SPINE,W/RADICULOPATHY  . GERD (gastroesophageal reflux disease)   . Hyperlipidemia   . Thyroid disease     HYPOTHYROIDISM  . Osteoporosis   . Rib fractures   . Numbness 08/21/12     [facility]   . Atrial fibrillation    Past Surgical History  Procedure Laterality Date  . Abdominal hysterectomy      WITH OOPHORECTOMY,? BILATERAL FOR ENDOMETRIOSIS  . Breast enhancement surgery    . G2 p2    . Bladder tacking    . Endovenous ablation saphenous vein w/ laser  07-13-2011    right greater saphenous vein, Dr Jerilee Field  . Endovenous ablation saphenous vein w/ laser  08/2011    L ; Dr Hart Rochester  . Cataract extraction    . Esophageal dilation       X 1  . Colonoscopy       Review of Systems  otherwise feels well. She has chronic back pain. Good compliance with medication.     Objective:   Physical Exam BP 96/68  Pulse 62  Temp(Src) 98 F (36.7 C) (Oral)  Wt 131 lb (59.421 kg)  BMI 22.15 kg/m2  SpO2 96%  General -- alert, well-developed, BP slightly low but she is in no distress and mentally at baseline compared to last time she was here.   Extremities-- no pretibial edema bilaterally, Good pedal pulses. Previously seen area at the R foot of redness,  warmness and mild tenderness is better, actually is back to normal except for  mild hyperpigmentation Neurologic-- alert & oriented X3; DTRs and motor strength symmetric.Marland Kitchen Psych--  not anxious appearing and not depressed appearing.          Assessment & Plan:  Cellulitis, Improve, no need for further antibiotics.  Neuropathy? Burning in her feet unrelated to cellulitis, likely neuropathy. Chart is reviewed, she does not have a B12 deficiency, diabetes or anemia; she takes folic acid. She does have chronic back pain. Plan:  refer to neurology with this new problem, may benefit From a nerve conduction study to determine the etiology of her symptoms more clearly.

## 2012-11-14 NOTE — Patient Instructions (Addendum)
Please schedule a routine visit to see [name], your primary doctor

## 2012-11-17 ENCOUNTER — Other Ambulatory Visit: Payer: Self-pay | Admitting: *Deleted

## 2012-11-17 MED ORDER — HYDROCHLOROTHIAZIDE 25 MG PO TABS: 12.5000 mg | ORAL_TABLET | Freq: Every day | ORAL | Status: DC

## 2012-11-17 NOTE — Telephone Encounter (Signed)
Rx sent 

## 2012-12-06 ENCOUNTER — Other Ambulatory Visit: Payer: Self-pay | Admitting: *Deleted

## 2012-12-06 MED ORDER — HYDROCHLOROTHIAZIDE 25 MG PO TABS: 12.5000 mg | ORAL_TABLET | Freq: Every day | ORAL | Status: DC

## 2012-12-06 NOTE — Telephone Encounter (Signed)
Rx sent 

## 2012-12-26 ENCOUNTER — Ambulatory Visit (INDEPENDENT_AMBULATORY_CARE_PROVIDER_SITE_OTHER): Payer: Medicare Other | Admitting: Internal Medicine

## 2012-12-26 ENCOUNTER — Encounter: Payer: Self-pay | Admitting: Internal Medicine

## 2012-12-26 VITALS — BP 108/64 | HR 64 | Resp 12 | Ht 63.0 in | Wt 130.0 lb

## 2012-12-26 DIAGNOSIS — Z23 Encounter for immunization: Secondary | ICD-10-CM

## 2012-12-26 DIAGNOSIS — E782 Mixed hyperlipidemia: Secondary | ICD-10-CM

## 2012-12-26 DIAGNOSIS — Z1331 Encounter for screening for depression: Secondary | ICD-10-CM

## 2012-12-26 DIAGNOSIS — I10 Essential (primary) hypertension: Secondary | ICD-10-CM

## 2012-12-26 DIAGNOSIS — Z0000 Encounter for general adult medical examination without abnormal findings: Secondary | ICD-10-CM

## 2012-12-26 MED ORDER — LOSARTAN POTASSIUM 50 MG PO TABS: ORAL_TABLET | ORAL | Status: DC

## 2012-12-26 MED ORDER — LORAZEPAM 0.5 MG PO TABS: 0.5000 mg | ORAL_TABLET | Freq: Three times a day (TID) | ORAL | Status: DC | PRN

## 2012-12-26 MED ORDER — DONEPEZIL HCL 10 MG PO TABS: ORAL_TABLET | ORAL | Status: DC

## 2012-12-26 NOTE — Patient Instructions (Addendum)
If you verify that  My Chart system isactive; lab & Xray results will be released directly  to you as soon as I review & address these through the computer. If you choose not to sign up for My Chart within 36 hours of labs being drawn; results will be reviewed & interpretation added before being copied & mailed, causing a delay in getting the results to you.If you do not receive that report within 7-10 days ,please call. Additionally you can use this system to gain direct  access to your records  if  out of town or @ an office of a  physician who is not in  the My Chart network.  This improves continuity of care & places you in control of your medical record.   If specific food triggers for the rhinitis cannot be identified; she may be a candidate for nasal Atrovent.

## 2012-12-26 NOTE — Progress Notes (Signed)
Subjective:    Patient ID: Shelly Ross, female    DOB: May 19, 1925, 79 y.o.   MRN: [id number]  HPI Medicare Wellness Visit:  Psychosocial & medical history were reviewed as required by Medicare (abuse,antisocial behavioral risks,firearm risk).  Social history: caffeine: no  , alcohol: rarely  ,  tobacco use:   never Exercise :  See below Home & personal  safety / fall risk:due to DJD with weakness in lower extremities Limitations of activities of daily living:no Seatbelt  and smoke alarm use:yes Power of Attorney/Living Will status : in place Ophthalmology exam status : current Hearing evaluation status: not current Orientation :oriented X 3 Memory & recall :good Math testing:good Active depression / anxiety:depressed in context of sister's recent death Foreign travel history : last 29 [place] Immunization status for Shingles /Flu/ PNA/ tetanus : ? Tetanus needed Transfusion history:  no Preventive health surveillance status of colonoscopy, BMD , mammograms,PAP as per protocol/ WUJ:WJXB out of colonoscopic survelliance. Not seeing Gyn as "Medicare won't pay" Dental care: every 12 mos  Chart reviewed &  Updated. Active issues reviewed & addressed. CBC and differential was normal 10/04/12. TSH was therapeutic 08/24/12.      Review of Systems  She is on a modified heart healthy diet; she exercises as water aerobics  45-60 minutes 4-5  times per week without symptoms. Specifically she denies chest pain, palpitations, dyspnea, or claudication. Family history is positive for premature coronary disease in her father. Advanced cholesterol testing reveals her LDL goal is less than 115. There is medication compliance with the statin. Significant abdominal symptoms or myalgias denied but she has significant knee pain. She is on generic Aricept for memory issues.  She describes significant rhinitis with nasal drainage after meals. There no significant extrinsic symptoms of itchy, watery eyes,  sneezing. There is no associated symptoms or signs of rhinosinusitis. .     Objective:   Physical Exam  Gen.: Healthy and well-nourished in appearance. Alert, appropriate and cooperative throughout exam.Appears younger than stated age  Head: Normocephalic without obvious abnormalities  Eyes: No corneal or conjunctival inflammation noted.  Extraocular motion intact. Vision grossly normal with lenses Ears: External  ear exam reveals no significant lesions or deformities. Canals clear .TMs normal. Hearing aids bilaterally. Nose: External nasal exam reveals no deformity or inflammation. Nasal mucosa are pink and moist. No lesions or exudates noted.  Mouth: Oral mucosa and oropharynx reveal no lesions or exudates. Teeth in good repair. Neck: No deformities, masses, or tenderness noted. Range of motion decreased. Thyroid small. Lungs: Normal respiratory effort; chest expands symmetrically. Lungs are clear to auscultation without rales, wheezes, or increased work of breathing. Heart: Normal rate and rhythm. Normal S1 and S2. No gallop, click, or rub. No murmur. Abdomen: Bowel sounds normal; abdomen soft and nontender. No masses, organomegaly or hernias noted. Genitalia:  Gyn  F/U recommended                            Musculoskeletal/extremities: No deformity or scoliosis noted of  the thoracic or lumbar spine.  No clubbing, cyanosis, edema, or significant extremity  deformity noted. Range of motion normal .Tone & strength  Normal. Crepitus R knee > L.. Nail health good. Able to lie down & sit up w/o help. Negative SLR bilaterally Vascular: Carotid, radial artery, dorsalis pedis and  posterior tibial pulses are full and equal. No bruits present. Varicose veins of lower extremities Neurologic: Alert and oriented x3. Deep  tendon reflexes symmetrical and normal.        Skin: Intact without suspicious lesions or rashes. Lymph: No cervical, axillary lymphadenopathy present. Psych: Mood and affect are  normal. Normally interactive                                                                                        Assessment & Plan:  #1 Medicare Wellness Exam; criteria met ; data entered #2 Problem List/Diagnoses reviewed #3 rhinitis, post prandial. ? Vasomotor etiology Plan:  Assessments made/ Orders entered

## 2012-12-27 LAB — BASIC METABOLIC PANEL
BUN: 15 mg/dL (ref 6–23)
Calcium: 10.6 mg/dL — ABNORMAL HIGH (ref 8.4–10.5)
Creatinine, Ser: 0.8 mg/dL (ref 0.4–1.2)
GFR: 74.42 mL/min (ref >60.00)

## 2012-12-27 LAB — LIPID PANEL: Total CHOL/HDL Ratio: 2

## 2012-12-27 LAB — ALT: ALT: 29 U/L (ref 0–35)

## 2013-01-09 ENCOUNTER — Telehealth: Payer: Self-pay | Admitting: Internal Medicine

## 2013-01-09 NOTE — Telephone Encounter (Signed)
Spoke with patient, schedule appointment with Shelly Ross at 10:30 tomorrow, I offered patient 8:30 slot (too early per patient). Patient aware and verbalized understanding if symptoms progress to seek medical attention at Urgent Care or the Emergency Room. Patient's PCP with late appointment at 4:30 pm, patient preferred to be seen before that. Patient informed to take ALL medication bottles to appointment tomorrow   Hopp please advise if you have any additional recommendations or that you agree with course of action

## 2013-01-09 NOTE — Telephone Encounter (Signed)
Caller Name: Jazmynn  Phone: [phone]  Patient: Brystal, Kildow  Gender: Female  DOB: [birth year]  Age: 79 Years  PCP: Marga Melnick   Does the office need to follow up with this patient?: Yes  Instructions For The Office: recommendations  RN Note:  lightheaded, tired more than normal, states has been dropping last week also;  please call with recommendations   Reason For Call & Symptoms: emergent call re: low blood pressure, when taken 1500: BP by CNA at assisted living; 104/58, had been lower before that.  Reviewed Health History In EMR: Yes  Reviewed Medications In EMR: Yes  Reviewed Allergies In EMR: Yes  Reviewed Surgeries / Procedures: Yes  Date of Onset of Symptoms: 01/09/2013  Guideline(s) Used:  High Blood Pressure  Disposition Per Guideline:  Discuss with PCP and Callback by Nurse Today  Reason For Disposition Reached:   Taking BP medications and feels is having side effects (e.g., dizziness)  Advice Given:  BP less than 120 / 80   This is considered normal blood pressure  Call Back If:  You become worse.  Patient Will Follow Care Advice:  YES

## 2013-01-10 ENCOUNTER — Encounter: Payer: Self-pay | Admitting: Internal Medicine

## 2013-01-10 ENCOUNTER — Ambulatory Visit (INDEPENDENT_AMBULATORY_CARE_PROVIDER_SITE_OTHER): Payer: Medicare Other | Admitting: Internal Medicine

## 2013-01-10 VITALS — BP 110/72 | HR 65 | Temp 97.5°F | Ht 63.0 in | Wt 129.2 lb

## 2013-01-10 DIAGNOSIS — I959 Hypotension, unspecified: Secondary | ICD-10-CM

## 2013-01-10 NOTE — Progress Notes (Signed)
Subjective:    Patient ID: Shelly Ross, female    DOB: 1924/08/07, 79 y.o.   MRN: [id number]  HPI  Pt presents to the clinic today with c/o low blood pressure. This occurred yesterday. She woke up fatigue, tired which seemed to get worse in the early afternoon. She checked her blood pressure and it was 89/49. She denied chest pain, chest tightness, shortness of breath or dizziness with this episode. She called the triage nurse where she is living. She came and took her blood pressure and it was 114/67.  She is on HCTZ and Cozaar for the treatment of high blood pressure. She is taking her medication as directed.  Review of Systems      Past Medical History  Diagnosis Date  . Chronic low back pain   . Premature ventricular contractions   . Dizziness   . Hypertension   . Neuralgia, neuritis, and radiculitis, unspecified   . Degenerative disc disease     CERVICAL SPINE,W/RADICULOPATHY  . GERD (gastroesophageal reflux disease)   . Hyperlipidemia   . Thyroid disease     HYPOTHYROIDISM  . Osteoporosis   . Rib fractures   . Numbness 08/21/12     [facility]   . Atrial fibrillation     Current Outpatient Prescriptions  Medication Sig Dispense Refill  . aspirin EC 81 MG tablet Take 81 mg by mouth daily.      Marland Kitchen atorvastatin (LIPITOR) 20 MG tablet Take 0.5 tablets (10 mg total) by mouth daily.  90 tablet  2  . beta carotene w/minerals (OCUVITE) tablet Take 1 tablet by mouth daily.      . Calcium Citrate-Vitamin D (CALCIUM CITRATE + PO) Take by mouth daily.      . cholecalciferol (VITAMIN D) 1000 UNITS tablet Take 1,000 Units by mouth daily.      . Cranberry 200 MG CAPS Take 1.5 capsules by mouth daily.      Marland Kitchen donepezil (ARICEPT) 10 MG tablet TAKE 1 TABLET BY MOUTH EVERY NIGHT AT BEDTIME  30 tablet  3  . folic acid (FOLVITE) 1 MG tablet Take 1 mg by mouth daily.      . hydrochlorothiazide (HYDRODIURIL) 25 MG tablet Take 0.5 tablets (12.5 mg total) by mouth daily. 1/2 tablet  45  tablet  1  . levothyroxine (SYNTHROID, LEVOTHROID) 75 MCG tablet Take 75 mcg by mouth. 1/2 by mouth daily EXCEPT 1 on Wed      . LORazepam (ATIVAN) 0.5 MG tablet Take 1 tablet (0.5 mg total) by mouth every 8 (eight) hours as needed for anxiety.  30 tablet  0  . losartan (COZAAR) 50 MG tablet 1/2 by mouth daily  45 tablet  3  . Multiple Vitamin (MULTIVITAMIN) tablet Take 1 tablet by mouth daily.        . vitamin E 400 UNIT capsule Take 400 Units by mouth daily.       No current facility-administered medications for this visit.    Allergies  Allergen Reactions  . Percocet (Oxycodone-Acetaminophen) Nausea And Vomiting  . Lansoprazole Other (See Comments)    Induced nausea and possibly headaches    Family History  Problem Relation Age of Onset  . Stroke Mother 27  . Heart attack Father     in 65s  . Deep vein thrombosis Brother     AND PTE  . Diabetes Brother   . Cancer Paternal Aunt      INTESTINAL   . Alzheimer's disease Sister   .  Alzheimer's disease Brother   . Tuberculosis Brother   . Aneurysm Brother     cns  . Heart Problems    . COPD      smoking  . Heart attack Sister 53    History   Social History  . Marital Status: Widowed    Spouse Name: N/A    Number of Children: 2  . Years of Education: 12th   Occupational History  . Retired     [employer]   Social History Main Topics  . Smoking status: Never Smoker   . Smokeless tobacco: Never Used  . Alcohol Use: Yes     Comment: wine, seldom   . Drug Use: No  . Sexually Active: Not on file   Other Topics Concern  . Not on file   Social History Narrative      Pt lives in assisted living.    Caffeine Use- Very little.     Constitutional: Pt reports fatigue. Denies fever, malaise, headache or abrupt weight changes.  Respiratory: Denies difficulty breathing, shortness of breath, cough or sputum production.   Cardiovascular: Denies chest pain, chest tightness, palpitations  or swelling in the hands or feet.  Neurological: Denies dizziness, difficulty with memory, difficulty with speech or problems with balance and coordination.   No other specific complaints in a complete review of systems (except as listed in HPI above).  Objective:   Physical Exam   BP 110/72  Pulse 65  Temp(Src) 97.5 F (36.4 C) (Oral)  Ht 5\' 3"  (1.6 m)  Wt 129 lb 4 oz (58.627 kg)  BMI 22.9 kg/m2  SpO2 94% Wt Readings from Last 3 Encounters:  01/10/13 129 lb 4 oz (58.627 kg)  12/26/12 130 lb (58.968 kg)  11/14/12 131 lb (59.421 kg)    General: Appears her stated age, well developed, well nourished in NAD. Cardiovascular: Normal rate and rhythm. S1,S2 noted.  No murmur, rubs or gallops noted. No JVD or BLE edema. No carotid bruits noted. Pulmonary/Chest: Normal effort and positive vesicular breath sounds. No respiratory distress. No wheezes, rales or ronchi noted.   Neurological: Alert and oriented. Cranial nerves II-XII intact. Coordination normal. +DTRs bilaterally.  BMET    Component Value Date/Time   NA 140 12/26/2012 1540   K 3.5 12/26/2012 1540   CL 104 12/26/2012 1540   CO2 32 12/26/2012 1540   GLUCOSE 93 12/26/2012 1540   BUN 15 12/26/2012 1540   CREATININE 0.8 12/26/2012 1540   CALCIUM 10.6* 12/26/2012 1540   GFRNONAA 78* 12/24/2011 1349   GFRAA 90* 12/24/2011 1349    Lipid Panel     Component Value Date/Time   CHOL 170 12/26/2012 1540   TRIG 42.0 12/26/2012 1540   HDL 70.00 12/26/2012 1540   CHOLHDL 2 12/26/2012 1540   VLDL 8.4 12/26/2012 1540   LDLCALC 92 12/26/2012 1540    CBC    Component Value Date/Time   WBC 7.3 10/04/2012 1636   RBC 4.77 10/04/2012 1636   HGB 14.9 10/04/2012 1636   HCT 43.5 10/04/2012 1636   PLT 270.0 10/04/2012 1636   MCV 91.2 10/04/2012 1636   MCH 31.2 12/24/2011 1349   MCHC 34.2 10/04/2012 1636   RDW 12.9 10/04/2012 1636   LYMPHSABS 2.6 10/04/2012 1636   MONOABS 0.6 10/04/2012 1636   EOSABS 0.4 10/04/2012 1636   BASOSABS 0.0 10/04/2012 1636    Hgb  A1C No results found for this basename: HGBA1C  Assessment & Plan:   Hypotension, new onset:  Lets hole the HCTZ for the next week Check you BP daily, if less than 120/70, call the office, or if symptoms return RTC in 1 week for BP check

## 2013-01-10 NOTE — Patient Instructions (Signed)
Hypotension  As your heart beats, it forces blood through your arteries. This force is your blood pressure. If your blood pressure is too low for you to go about your normal activities or support the organs of your body, you have hypotension, or low blood pressure. When your blood pressure becomes too low, you may not get enough blood to your brain, and you may feel weak, lightheaded, or develop a more rapid heart rate. In a more severe case, you may faint: this is a sudden, brief loss of consciousness where you pass out and recover completely.  CAUSES   Loss of blood or fluids from the body. This occurs during rapid blood loss. It can also come from dehydration when the body is not taking in enough fluids or is losing fluids faster than they can be replaced. Examples of this would be severe vomiting and diarrhea.   Not taking in enough fluids and salts. This is common in the elderly where thirst mechanisms are not working as well. This means you do not feel thirsty and you do not take in enough water.   Use of blood pressure pills and other medications that may lower the blood pressure below normal.   Over medication (always take your medications as directed).   Irregular heart beat or heart failure when the heart is no longer working well enough to support blood pressure.  Hospitalization is sometimes required for low blood pressure if fluid or blood replacement is needed, if time is needed for medications to wear off, or if further evaluation is needed. Less common causes of low blood pressure might include peripheral or autonomic neuropathy (nerve problems), Parkinson's disease, or other illnesses. Treatment might include a change in diet, change in medications (including medicines aimed at raising your blood pressure), and use of support stockings.  HOME CARE INSTRUCTIONS    Maintain good fluid intake and use a little more salt on your food (if you are not on a restricted diet or having problems with your  heart such as heart failure). This is especially important for the elderly when you may not feel thirsty in the winter.   Take your medications as directed.   Get up slowly from reclining or sitting positions. This gives your blood pressure a chance to adjust. As we grow older our ability to regulate our blood pressure may not be as good as when we were younger.   Wear support stockings if prescribed.   Use walkers, canes, etc., if advised.   Talk with your physician or nurse about a Home Safety Evaluation (usually done by visiting nurses).  SEEK IMMEDIATE MEDICAL CARE IF:    You have a fainting episode. Do not drive yourself. Call 911 if no other help is available.   You have chest pain, nausea (feeling sick to your stomach) or vomiting.   You have a loss of feeling in some part of your body, or lose movement in your arms or legs.   You have difficulty with speech.   You become sweaty and/or feel light headed.  Make sure you are re-checked as instructed.  MAKE SURE YOU:    Understand these instructions.   Will watch your condition.   Will get help right away if you are not doing well or get worse.  Document Released: 05/18/2005 Document Revised: 08/10/2011 Document Reviewed: 01/06/2008  [employer] Patient Information 2014 [employer]

## 2013-01-17 ENCOUNTER — Encounter: Payer: Self-pay | Admitting: Internal Medicine

## 2013-01-17 ENCOUNTER — Ambulatory Visit (INDEPENDENT_AMBULATORY_CARE_PROVIDER_SITE_OTHER): Payer: Medicare Other | Admitting: Internal Medicine

## 2013-01-17 VITALS — BP 123/74 | HR 61 | Temp 98.2°F | Ht 63.0 in | Wt 128.0 lb

## 2013-01-17 DIAGNOSIS — R51 Headache: Secondary | ICD-10-CM

## 2013-01-17 DIAGNOSIS — I959 Hypotension, unspecified: Secondary | ICD-10-CM

## 2013-01-17 NOTE — Patient Instructions (Addendum)
Stay off HCTZ and stop calcium supplementation, folic acid, vitamin E, and multivitamins. Check  BMET in 6-8 weeks. Minimal Blood Pressure Goal= AVERAGE < 140/90;  Ideal is an AVERAGE < 135/85. This AVERAGE should be calculated from @ least 5-7 BP readings taken @ different times of day on different days of week. You should not respond to isolated BP readings , but rather the AVERAGE for that week .Please bring your  blood pressure cuff to office visits to verify that it is reliable.It  can also be checked against the blood pressure device at the pharmacy.

## 2013-01-17 NOTE — Progress Notes (Signed)
  Subjective:    Patient ID: Shelly Ross, female    DOB: 22-Apr-1925, 79 y.o.   MRN: [id number]  HPI CHRONIC HYPERTENSION follow-up:  Because of R temporal headache  in past  2 weeks; she has monitored BP; it was as low as 100/44.Ms. Sampson Si D/Ced  HCTZ on 8/12 with improvement. Some  significant lightheadedness with low BP & headache. No syncope     Exercise program as water aerobics times 4-5  per week for 60 minutes  No specific dietary program but no added salt @ table          Review of Systems No chest pain, palpitations, dyspnea, claudication,edema or paroxysmal nocturnal dyspnea described      Objective:   Physical Exam  Appears healthy and well-nourished & in no acute distress.Appears younger than stated age  No carotid bruits are present.No neck pain distention present sitting.   Heart rhythm and rate are normal with no significant murmurs or gallops. S4  Chest is clear with no increased work of breathing  There is no evidence of aortic aneurysm or renal artery bruits  Abdomen soft with no organomegaly or masses.   No clubbing, cyanosis or edema present.  Pedal pulses are intact but decreased  No ischemic skin changes are present .   Alert and interactive. Strength, tone normal       Assessment & Plan:  #1 headache & hypotension resolved Plan: no change; hold HCTZ

## 2013-01-23 ENCOUNTER — Telehealth: Payer: Self-pay | Admitting: General Practice

## 2013-01-23 NOTE — Telephone Encounter (Signed)
Message left on Triage line stating pt has been having fluctuating BP readings. Called pt back and was advised that on  Sat: 60/40 (4pm)          100/40 ( 8pm)  Sunday: Pretty regular  Monday:  130/78                  10 2/67  (after working in yard)  Pt would like to know if she should be concerned with the changes in her BP. States she is feeling better today. Has had no dizziness, lightheadedness, fatigue, or chest pains. Please advise.

## 2013-01-23 NOTE — Telephone Encounter (Signed)
Ov tomorrow and bring her bp cuff with her

## 2013-01-23 NOTE — Telephone Encounter (Signed)
Pt notified and appt made for 10am tomorrow with Maximino Sarin. This message was forwarded to [facility] also.

## 2013-01-24 ENCOUNTER — Ambulatory Visit (INDEPENDENT_AMBULATORY_CARE_PROVIDER_SITE_OTHER): Payer: Medicare Other | Admitting: Nurse Practitioner

## 2013-01-24 ENCOUNTER — Encounter: Payer: Self-pay | Admitting: Nurse Practitioner

## 2013-01-24 VITALS — BP 110/60 | HR 64 | Temp 97.5°F | Ht 63.0 in | Wt 127.8 lb

## 2013-01-24 DIAGNOSIS — K2970 Gastritis, unspecified, without bleeding: Secondary | ICD-10-CM

## 2013-01-24 DIAGNOSIS — I959 Hypotension, unspecified: Secondary | ICD-10-CM

## 2013-01-24 MED ORDER — LOSARTAN POTASSIUM 25 MG PO TABS: ORAL_TABLET | ORAL | Status: DC

## 2013-01-24 NOTE — Patient Instructions (Addendum)
I think the episodes of low blood pressure over the weekend are directly related to having diarrhea-you got dehydrated, which drops your blood pressure. When you have diarrhea, you need to drink 1-2 glasses of water with each diarrhea episode or make sure you are sipping fluids every hour. I am decreasing the dose of blood pressure medicine. Your home blood pressure cuff is too large which can result in falsely low readings. You need a small to regular size cuff. Do not take vitamin B supplement. Your abdominal tenderness may be related to recent diarrhea. Please follow up with Dr. Alwyn Ren in 3-4 weeks for blood pressure check and abdominal tenderness.

## 2013-01-25 NOTE — Progress Notes (Signed)
Subjective:     Shelly Ross is a 79 y.o. female and is here for an evaluation of low blood pressure over the weekend. She gives a history of 1 day of diarrhea with 5-6 episodes in 24 hours. She felt light-headed with headache, mild abdominal pain. Her Blood pressures at home [facility]) were as low as 60/40. Diarrhea has resolved, she is feeling better, although somewhat weak, overall improved. headache resolved.   History   Social History  . Marital Status: Widowed    Spouse Name: N/A    Number of Children: 2  . Years of Education: 12th   Occupational History  . Retired     [employer]   Social History Main Topics  . Smoking status: Never Smoker   . Smokeless tobacco: Never Used  . Alcohol Use: Yes     Comment: wine, seldom   . Drug Use: No  . Sexual Activity: Not on file   Other Topics Concern  . Not on file   Social History Narrative      Pt lives in assisted living.    Caffeine Use- Very little.   Health Maintenance  Topic Date Due  . Colonoscopy  12/14/1976  . Zostavax  12/15/1986  . Influenza Vaccine  12/30/2012  . Tetanus/tdap  12/27/2022  . Pneumococcal Polysaccharide Vaccine Age 73 And Over  Completed    The following portions of the patient's history were reviewed and updated as appropriate: allergies, current medications, past medical history, past social history and problem list.  Review of Systems Constitutional: negative for fevers, night sweats and weight loss Eyes: negative for visual disturbance Respiratory: negative for cough and sputum Cardiovascular: negative for lower extremity edema and palpitations Gastrointestinal: positive for abdominal pain and diarrhea   Objective:    BP 110/60  Pulse 64  Temp(Src) 97.5 F (36.4 C) (Oral)  Ht 5\' 3"  (1.6 m)  Wt 127 lb 12.8 oz (57.97 kg)  BMI 22.64 kg/m2  SpO2 95% General appearance: alert, cooperative, appears stated age and no distress Head: Normocephalic,  without obvious abnormality, atraumatic Eyes: negative findings: lids and lashes normal, conjunctivae and sclerae normal and corneas clear Lungs: clear to auscultation bilaterally Heart: regular rate and rhythm, S1, S2 normal, no murmur, click, rub or gallop Abdomen: soft, non-tender; bowel sounds normal; no masses,  no organomegaly Extremities: extremities normal, atraumatic, no cyanosis or edema Pulses: 2+ and symmetric    Assessment:   1Recent gastritis-probably had mild dehydration causing low blood pressure 2HTN-Bp well controlled on cozaar 25 mg.(110/60 in ofc today). Having episodes of low bp at home w/dizzyness (has had previous ofc visits w/c/o low BP).  3Calcium elevation 4Vitamin B12 elevation  Plan:     1 Discussed need for fluid replacement when having diarrhea 2 Will decrease cozaar to 12.5mg  qd. 3  Monitor 4 Stop Vit B supplement   See After Visit Summary for Counseling Recommendations

## 2013-02-02 ENCOUNTER — Telehealth: Payer: Self-pay | Admitting: Internal Medicine

## 2013-02-02 ENCOUNTER — Encounter: Payer: Self-pay | Admitting: Nurse Practitioner

## 2013-02-02 ENCOUNTER — Telehealth (INDEPENDENT_AMBULATORY_CARE_PROVIDER_SITE_OTHER): Payer: Medicare Other | Admitting: Nurse Practitioner

## 2013-02-02 VITALS — BP 92/54 | HR 70 | Temp 97.6°F | Ht 63.0 in | Wt 129.0 lb

## 2013-02-02 DIAGNOSIS — R197 Diarrhea, unspecified: Secondary | ICD-10-CM

## 2013-02-02 DIAGNOSIS — R1032 Left lower quadrant pain: Secondary | ICD-10-CM

## 2013-02-02 LAB — URINALYSIS, ROUTINE W REFLEX MICROSCOPIC
Hgb urine dipstick: NEGATIVE
Nitrite: NEGATIVE
Specific Gravity, Urine: <=1.005 (ref 1.000–1.030)
Urobilinogen, UA: 0.2 (ref 0.0–1.0)

## 2013-02-02 LAB — CBC WITH DIFFERENTIAL/PLATELET
Basophils Absolute: 0 10*3/uL (ref 0.0–0.1)
Eosinophils Absolute: 0.1 10*3/uL (ref 0.0–0.7)
Lymphocytes Relative: 19.7 % (ref 12.0–46.0)
Lymphs Abs: 2.3 10*3/uL (ref 0.7–4.0)
MCHC: 33.5 g/dL (ref 30.0–36.0)
Monocytes Absolute: 0.7 10*3/uL (ref 0.1–1.0)
Neutro Abs: 8.5 10*3/uL — ABNORMAL HIGH (ref 1.4–7.7)
RBC: 4.48 Mil/uL (ref 3.87–5.11)
RDW: 13.1 % (ref 11.5–14.6)
WBC: 11.8 10*3/uL — ABNORMAL HIGH (ref 4.5–10.5)

## 2013-02-02 LAB — HEPATIC FUNCTION PANEL: Albumin: 3.5 g/dL (ref 3.5–5.2)

## 2013-02-02 LAB — RENAL FUNCTION PANEL
BUN: 15 mg/dL (ref 6–23)
Creatinine, Ser: 0.8 mg/dL (ref 0.4–1.2)
GFR: 75.52 mL/min (ref >60.00)
Glucose, Bld: 93 mg/dL (ref 70–99)
Phosphorus: 2.4 mg/dL (ref 2.3–4.6)

## 2013-02-02 LAB — TSH: TSH: 2.93 u[IU]/mL (ref 0.35–5.50)

## 2013-02-02 NOTE — Patient Instructions (Signed)
I am not sure what is causing your diarrhea. Your lab results will give me more information. We will call you with lab results. In the meantime, eat applesauce (about 1 cup) daily to help bind stool. Continue to sip fluids-at least 8 oz. Each time you have a diarrhea episode. Please stop taking your I will discontinue your blood pressure medicine.  Diarrhea Diarrhea is frequent loose and watery bowel movements. It can cause you to feel weak and dehydrated. Dehydration can cause you to become tired and thirsty, have a dry mouth, and have decreased urination that often is dark yellow. Diarrhea is a sign of another problem, most often an infection that will not last long. In most cases, diarrhea typically lasts 2 3 days. However, it can last longer if it is a sign of something more serious. It is important to treat your diarrhea as directed by your caregive to lessen or prevent future episodes of diarrhea. CAUSES  Some common causes include:  Gastrointestinal infections caused by viruses, bacteria, or parasites.  Food poisoning or food allergies.  Certain medicines, such as antibiotics, chemotherapy, and laxatives.  Artificial sweeteners and fructose.  Digestive disorders. HOME CARE INSTRUCTIONS  Ensure adequate fluid intake (hydration): have 1 cup (8 oz) of fluid for each diarrhea episode. Avoid fluids that contain simple sugars or sports drinks, fruit juices, whole milk products, and sodas. Your urine should be clear or pale yellow if you are drinking enough fluids. Hydrate with an oral rehydration solution that you can purchase at pharmacies, retail stores, and online. You can prepare an oral rehydration solution at home by mixing the following ingredients together:    tsp table salt.   tsp baking soda.   tsp salt substitute containing potassium chloride.  1  tablespoons sugar.  1 L (34 oz) of water.  Certain foods and beverages may increase the speed at which food moves through the  gastrointestinal (GI) tract. These foods and beverages should be avoided and include:  Caffeinated and alcoholic beverages.  High-fiber foods, such as raw fruits and vegetables, nuts, seeds, and whole grain breads and cereals.  Foods and beverages sweetened with sugar alcohols, such as xylitol, sorbitol, and mannitol.  Some foods may be well tolerated and may help thicken stool including:  Starchy foods, such as rice, toast, pasta, low-sugar cereal, oatmeal, grits, baked potatoes, crackers, and bagels.  Bananas.  Applesauce.  Add probiotic-rich foods to help increase healthy bacteria in the GI tract, such as yogurt and fermented milk products.  Wash your hands well after each diarrhea episode.  Only take over-the-counter or prescription medicines as directed by your caregiver.  Take a warm bath to relieve any burning or pain from frequent diarrhea episodes. SEEK IMMEDIATE MEDICAL CARE IF:   You are unable to keep fluids down.  You have persistent vomiting.  You have blood in your stool, or your stools are black and tarry.  You do not urinate in 6 8 hours, or there is only a small amount of very dark urine.  You have abdominal pain that increases or localizes.  You have weakness, dizziness, confusion, or lightheadedness.  You have a severe headache.  Your diarrhea gets worse or does not get better.  You have a fever or persistent symptoms for more than 2 3 days.  You have a fever and your symptoms suddenly get worse. MAKE SURE YOU:   Understand these instructions.  Will watch your condition.  Will get help right away if you are not  doing well or get worse. Document Released: 05/08/2002 Document Revised: 05/04/2012 Document Reviewed: 01/24/2012 [facility] Patient Information 2014 [place], [place].

## 2013-02-02 NOTE — Progress Notes (Signed)
Subjective:     Shelly Ross is a 79 y.o. female who presents for evaluation of diarrhea. Onset of diarrhea was 10 days ago. Diarrhea is intermittent-not occuring daily, but has occurred every few days and is explosive and pt is not always able to get to the bathroom in time. When episodes occur she has diarrhea 4-5 times. Diarrhea has been associated with abdominal pain described as aching and left sided and had chills last night. Patient denies blood in stool, illness in household contacts, recent antibiotic use, recent travel, significant abdominal pain, unintentional weight loss. Previous visits for diarrhea: 1 visit, last week. Complaint was low blood pressure, but pt had had 1 day of diarrhea.. Evaluation to date: PE.  Treatment to date: hydration. Of not, she fell on R knee last night while trying to get into bed. Pt states she felt weak and still feels weak.  The following portions of the patient's history were reviewed and updated as appropriate: allergies, current medications, past medical history, past social history, past surgical history and problem list.  Review of Systems Constitutional: positive for anorexia and feels weak Gastrointestinal: positive for abdominal pain and diarrhea, negative for dyspepsia, jaundice, melena, nausea and vomiting Genitourinary:negative for dysuria Musculoskeletal:positive for pain in R knee after falling last night Endocrine: negative for temperature intolerance    Objective:    BP 92/54  Pulse 70  Temp(Src) 97.6 F (36.4 C) (Oral)  Ht 5\' 3"  (1.6 m)  Wt 129 lb (58.514 kg)  BMI 22.86 kg/m2  SpO2 95% General: alert, cooperative, appears stated age, fatigued and no distress  Hydration:  well hydrated  Abdomen:    no HSM, tender L epigastric & LLQ MSK: R knee has chronic changes-bony enlargement, no warmth. Quarter sized bruis inner aspect thigh just above knee.    Assessment:    Diarrhea of uncertain etiology, mild in severity  May be SE of  aricept-causes diarrhea in up to 15%, cramping up to 8%. Hypotension Elevated calcium Plan:      Appropriate educational material discussed and distributed. Discussed the appropriate management of diarrhea. Lab studies per orders. Stool studies per orders. Consider stopping aricept if all labs norm Stop bp med. Check phos & alk phos. See pt instructions.

## 2013-02-02 NOTE — Telephone Encounter (Signed)
Patient Information:  Caller Name: Malachi Bonds  Phone: [phone]  Patient: Shelly Ross, Shelly Ross  Gender: Female  DOB: [birth year]  Age: 79 Years  PCP: Marga Melnick  Office Follow Up:  Does the office need to follow up with this patient?: No  Instructions For The Office: N/A  NOTE:  With assistance from office appointment scheduled with Molly Maduro, NP at [facility] office for 14:00.  Caller was given address and phone number.   Symptoms  Reason For Call & Symptoms: Diarrhea and feeling weak. Onset diarrhea estimated 8/28; reports 3 bouts with diarrhea mostly at night. Subjective fever - chills. Current abdominal pain rated at 2 of 10.  Reports 2 large light colored stools in past 24 hours.  Go to Office Now per Diarrhea protocol due to Constant abdominal pain lasting > 2 hours.  Reviewed Health History In EMR: Yes  Reviewed Medications In EMR: Yes  Reviewed Allergies In EMR: Yes  Reviewed Surgeries / Procedures: Yes  Date of Onset of Symptoms: 01/26/2013  Treatments Tried: Imodium  Treatments Tried Worked: Yes  Guideline(s) Used:  Diarrhea  Disposition Per Guideline:   Go to Office Now  Reason For Disposition Reached:   Constant abdominal pain lasting > 2 hours  Advice Given:  N/A  Patient Will Follow Care Advice:  YES

## 2013-02-03 ENCOUNTER — Telehealth: Payer: Self-pay | Admitting: Nurse Practitioner

## 2013-02-03 DIAGNOSIS — K5732 Diverticulitis of large intestine without perforation or abscess without bleeding: Secondary | ICD-10-CM

## 2013-02-03 MED ORDER — AMOXICILLIN-POT CLAVULANATE 875-125 MG PO TABS: 1.0000 | ORAL_TABLET | Freq: Two times a day (BID) | ORAL | Status: DC

## 2013-02-03 NOTE — Telephone Encounter (Signed)
WBC elevated, Sodium slightly low.  Urine, kidney, liver, thyroid func. Normal Hgb normal. Stool sample pending Spoke w/pt today, continues to feel poorly-HA & fatigue, no more diarrhea.  Symptoms over last 10 days-intermittent diarrhea, fatigue, L sided abdominal pain-will treat for diverticulitis: augmentin & soft diet for 7 days. Start [employer]. Told pt not to start augmentin until stool sample is collected, unless she cannot get one in 48 hours. Refigerate stool sample if collected over weekend. Will f/u w/Dr Alwyn Ren in 2 weeks.

## 2013-02-08 ENCOUNTER — Other Ambulatory Visit: Payer: Medicare Other

## 2013-02-08 ENCOUNTER — Telehealth: Payer: Self-pay | Admitting: Nurse Practitioner

## 2013-02-08 DIAGNOSIS — R197 Diarrhea, unspecified: Secondary | ICD-10-CM

## 2013-02-08 NOTE — Telephone Encounter (Signed)
Sheena from [facility] lab stated that results are not in yet.

## 2013-02-09 NOTE — Telephone Encounter (Signed)
Lactoferrin is pos. DD: IBD, ca, polyps, NSAID enteropathy Ova & parasites; stool culture pending Diarrhea has improved since pt has limited diet to full liquids & soft foods. She added daily yogurt for probiotics and ensure, as albumin is borderline low.  Will refer to GI for further work up of intermittent diarrhea, abd discomfort, L-sided tenderness & pos lactoferrin.  Discussed all w/pt and her son Joya San. All questions answered.

## 2013-02-10 ENCOUNTER — Encounter: Payer: Self-pay | Admitting: Gastroenterology

## 2013-02-10 ENCOUNTER — Telehealth: Payer: Self-pay | Admitting: Gastroenterology

## 2013-02-10 ENCOUNTER — Other Ambulatory Visit: Payer: Medicare Other

## 2013-02-10 ENCOUNTER — Ambulatory Visit (INDEPENDENT_AMBULATORY_CARE_PROVIDER_SITE_OTHER): Payer: Medicare Other | Admitting: Gastroenterology

## 2013-02-10 VITALS — BP 88/50 | HR 72 | Ht 61.5 in | Wt 126.5 lb

## 2013-02-10 DIAGNOSIS — R197 Diarrhea, unspecified: Secondary | ICD-10-CM

## 2013-02-10 DIAGNOSIS — R1032 Left lower quadrant pain: Secondary | ICD-10-CM

## 2013-02-10 LAB — HEMOCCULT SLIDES (X 3 CARDS)
Fecal Occult Blood: NEGATIVE
OCCULT 1: POSITIVE
OCCULT 5: NEGATIVE

## 2013-02-10 NOTE — Patient Instructions (Addendum)
You can fill the stool container at home and keep it refrigerated and bring it in within 24 hours.  You could do this Sunday  9-14afternoon  or evening and bring it to our lab on Monday 02-13-2013. Once you turn in the stool study you can start the Metronidazole medication.

## 2013-02-10 NOTE — Telephone Encounter (Signed)
See our PA

## 2013-02-10 NOTE — Progress Notes (Signed)
02/10/2013 Shelly Ross [id number] [birth year]   HISTORY OF PRESENT ILLNESS:  Patient is a pleasant 79 year old female who is a patient of Dr. Norval Gable.  She presents to our office today at the request of her PCP for evaluation of her diarrhea.  Says that it began about 3 weeks ago and has a very foul smell.  Prior to this she never had any issues with diarrhea.  She is having watery stools at least 3 times per day and is having episodes of incontinence.  Stools for O&P were collected and were negative.  Stool culture is still pending.  Lactoferrin is positive.  No Cdiff was collected.  She was started on Aricept about 2 months ago.  No blood noted in stool.  TSH, CBC, and CMP were unremarkable.  Last colonoscopy in 10/2005 showed only diverticulosis.   Past Medical History  Diagnosis Date  . Chronic low back pain   . Premature ventricular contractions   . Dizziness   . Hypertension   . Neuralgia, neuritis, and radiculitis, unspecified   . Degenerative disc disease     CERVICAL SPINE,W/RADICULOPATHY  . GERD (gastroesophageal reflux disease)   . Hyperlipidemia   . Thyroid disease     HYPOTHYROIDISM  . Osteoporosis   . Rib fractures   . Numbness 08/21/12     [facility]   . Atrial fibrillation    Past Surgical History  Procedure Laterality Date  . Abdominal hysterectomy      WITH OOPHORECTOMY,? BILATERAL FOR ENDOMETRIOSIS  . Breast enhancement surgery    . G2 p2    . Bladder tacking    . Endovenous ablation saphenous vein w/ laser  07-13-2011    right greater saphenous vein, Dr Jerilee Field  . Endovenous ablation saphenous vein w/ laser  08/2011    L ; Dr Hart [place]  . Cataract extraction Bilateral   . Esophageal dilation       X 1  . Colonoscopy    . Cardiac electrophysiology mapping and ablation      reports that she has never smoked. She has never used smokeless tobacco. She reports that  drinks alcohol. She reports that she does not use illicit drugs. family history  includes Alzheimer's disease in her brother and sister; Aneurysm in her brother; COPD in her sister; Cancer in her paternal aunt; Deep vein thrombosis in her brother; Diabetes in her brother; Heart Problems in an other family member; Heart attack in her father; Heart attack (age of onset: 73) in her sister; Stroke (age of onset: 28) in her mother; Tuberculosis in her brother. Allergies  Allergen Reactions  . Percocet [Oxycodone-Acetaminophen] Nausea And Vomiting  . Lansoprazole Other (See Comments)    Induced nausea and possibly headaches      Outpatient Encounter Prescriptions as of 02/10/2013  Medication Sig Dispense Refill  . aspirin EC 81 MG tablet Take 81 mg by mouth daily.      Marland Kitchen atorvastatin (LIPITOR) 20 MG tablet Take 0.5 tablets (10 mg total) by mouth daily.  90 tablet  2  . beta carotene w/minerals (OCUVITE) tablet Take 1 tablet by mouth daily.      . cholecalciferol (VITAMIN D) 1000 UNITS tablet Take 1,000 Units by mouth daily.      . Cranberry 200 MG CAPS Take 1.5 capsules by mouth daily.      Marland Kitchen donepezil (ARICEPT) 10 MG tablet TAKE 1 TABLET BY MOUTH EVERY NIGHT AT BEDTIME  30 tablet  3  .  levothyroxine (SYNTHROID, LEVOTHROID) 75 MCG tablet Take by mouth. 1/2 by mouth daily EXCEPT 1 on Wed      . [DISCONTINUED] amoxicillin-clavulanate (AUGMENTIN) 875-125 MG per tablet Take 1 tablet by mouth 2 (two) times daily.  14 tablet  0  . [DISCONTINUED] LORazepam (ATIVAN) 0.5 MG tablet Take 1 tablet (0.5 mg total) by mouth every 8 (eight) hours as needed for anxiety.  30 tablet  0   No facility-administered encounter medications on file as of 02/10/2013.     REVIEW OF SYSTEMS  : All other systems reviewed and negative except where noted in the History of Present Illness.   PHYSICAL EXAM: BP 88/50  Pulse 72  Ht 5' 1.5" (1.562 m)  Wt 126 lb 8 oz (57.38 kg)  BMI 23.52 kg/m2 General: Well developed white female in no acute distress Head: Normocephalic and atraumatic Eyes:  sclerae  anicteric,conjunctive pink. Ears: Normal auditory acuity Lungs: Clear throughout to auscultation Heart: Regular rate and rhythm Abdomen: Soft, nontender, non-distended. No masses or hepatomegaly noted. Normal bowel sounds. Musculoskeletal: Symmetrical with no gross deformities  Skin: No lesions on visible extremities Extremities: No edema  Neurological: Alert oriented x 4, grossly nonfocal Psychological:  Alert and cooperative. Normal mood and affect  ASSESSMENT AND PLAN: -Diarrhea:  Acute with onset about 3 weeks ago.  No prior issues with diarrhea.  Possibly infectious vs medication side effect (started on aricept about 2 months ago).  Will check stool for Cdiff and await results of stool culture.  Once stool study is collected she could take an empiric trial of flagyl, which she has at home and was given by her PCP.  If this does not help and stool studies are negative then would consider discontinuing the aricept and see if this resolves the issue.

## 2013-02-10 NOTE — Telephone Encounter (Signed)
Informed pt she needs to come in; she will see Doug Sou, PA at 2:30pm.

## 2013-02-10 NOTE — Telephone Encounter (Signed)
Pt reports diarrhea x 3 weeks that has worsened the last 2. She reports the stools are pure water and have a horrible smell. Midlevel at Dr Frederik Pear ofc has ordered some stool studues, culture is not back, lactoferrin is +, but I do not see a CDIFF. Please advise; order a CDIFF by PCR? thanks

## 2013-02-13 ENCOUNTER — Other Ambulatory Visit: Payer: Medicare Other

## 2013-02-13 DIAGNOSIS — R197 Diarrhea, unspecified: Secondary | ICD-10-CM

## 2013-02-14 LAB — CLOSTRIDIUM DIFFICILE BY PCR: Toxigenic C. Difficile by PCR: NOT DETECTED

## 2013-02-15 ENCOUNTER — Ambulatory Visit (INDEPENDENT_AMBULATORY_CARE_PROVIDER_SITE_OTHER): Payer: Medicare Other | Admitting: Internal Medicine

## 2013-02-15 ENCOUNTER — Encounter: Payer: Self-pay | Admitting: Internal Medicine

## 2013-02-15 VITALS — BP 117/75 | HR 65 | Temp 98.5°F | Wt 127.2 lb

## 2013-02-15 DIAGNOSIS — R197 Diarrhea, unspecified: Secondary | ICD-10-CM

## 2013-02-15 DIAGNOSIS — M1710 Unilateral primary osteoarthritis, unspecified knee: Secondary | ICD-10-CM | POA: Insufficient documentation

## 2013-02-15 LAB — CBC WITH DIFFERENTIAL/PLATELET
Eosinophils Relative: 3.5 % (ref 0.0–5.0)
HCT: 42.7 % (ref 36.0–46.0)
Hemoglobin: 14.4 g/dL (ref 12.0–15.0)
Lymphocytes Relative: 31.3 % (ref 12.0–46.0)
Lymphs Abs: 2.1 10*3/uL (ref 0.7–4.0)
Monocytes Relative: 8.1 % (ref 3.0–12.0)
Neutro Abs: 3.8 10*3/uL (ref 1.4–7.7)
RBC: 4.71 Mil/uL (ref 3.87–5.11)
WBC: 6.8 10*3/uL (ref 4.5–10.5)

## 2013-02-15 LAB — BASIC METABOLIC PANEL
GFR: 81.6 mL/min (ref >60.00)
Potassium: 3.8 mEq/L (ref 3.5–5.1)
Sodium: 136 mEq/L (ref 135–145)

## 2013-02-15 LAB — SEDIMENTATION RATE: Sed Rate: 12 mm/hr (ref 0–22)

## 2013-02-15 NOTE — Assessment & Plan Note (Addendum)
Labs will be rechecked. She will continue to hold the generic Aricept, BP meds , & statin. Probiotic will be initiated. Good diet include  jello, sherbert (NOT ice cream), Lipton's chicken noodle soup(NOT cream based soups),Gatorade Lite, flat Ginger ale (without High Fructose Corn Syrup),dry toast or crackers, baked potato.No milk , dairy or grease until bowels are formed. Align  OR Florastor , Pro Biotics , daily if stools are loose. Immodium AD for frankly watery stool. Report increasing pain, fever or rectal bleeding

## 2013-02-15 NOTE — Patient Instructions (Addendum)
Good diet choices include  jello, sherbert (NOT ice cream), Lipton's chicken noodle soup(NOT cream based soups),Gatorade Lite, flat Ginger ale (without High Fructose Corn Syrup),dry toast or crackers, baked potato.Avoid milk , dairy & grease. Use a probiotic each day if stools are loose. Imodium AD for frankly watery stool. Discontinue all medications except for vitamin D and the Synthroid.  Report increasing pain, fever or rectal bleeding

## 2013-02-15 NOTE — Progress Notes (Signed)
  Subjective:    Patient ID: Shelly Ross, female    DOB: 11/29/1924, 79 y.o.   MRN: [id number]  HPI She continues to have frank diarrhea up to 2 days 3 times a day manifested as watery stool with minimal particulate matter. She is not had any significant course of antibiotics; she did have 1 Augmentin but was told to discontinue this.Immodium AD did help for 2 days.  There was no specific trigger for the diarrhea as such as undercooked foods, well water, foreign travel, or infectious exposure.  She has a history of GERD but no history of significant colon issues.   The gastroenterology evaluation 02/10/13 was reviewed. There was some question that this might be related to the generic Aricept she has been on for approximately 2 months. Despite stopping this medicine 6 days ago; she continues to have the diarrhea unabated. The diarrhea started 53 weeks ago. Stools to date are negative for Escherichia coli, Salmonella, and C. difficile.   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 Despina Hick. As nasal cartilage; steroid injections and not employed. She has pain with standing and walking and the leg feels as if it will give way.   She denies fever, chills, sweats, or significant weight loss. She has no significant associated abdominal pain, melena, or rectal bleeding.  Her blood pressure had been low; all blood pressure medications were discontinued approximately 2 weeks ago. Blood pressures have remained within normal limits.      Objective:   Physical Exam  General appearance is one of good health and nourishment w/o distress. Appears younger than stated age  Eyes: No conjunctival inflammation or scleral icterus is present.  Oral exam: Dental hygiene is good; lips and gums are healthy appearing.There is no oropharyngeal erythema or exudate noted.   Heart:  Normal rate and regular rhythm. S1  and S2 normal without gallop, murmur, click, rub or other extra sounds     Lungs:Chest clear to auscultation; no wheezes, rhonchi,rales ,or rubs present.No increased work of breathing.   Abdomen: bowel sounds normal, soft and non-tender without masses, organomegaly or hernias noted.  No guarding or rebound   Skin:Warm & dry.  Intact without suspicious lesions or rashes ; no jaundice or tenting  Lymphatic: No lymphadenopathy is noted about the head, neck, axilla she is wearing soft braces on both knees. Crepitus is present, right greater than left.              Assessment & Plan:  See Current Assessment & Plan in Problem List under specific Diagnosis

## 2013-02-22 ENCOUNTER — Telehealth: Payer: Self-pay | Admitting: *Deleted

## 2013-02-22 NOTE — Telephone Encounter (Signed)
Patient called and stated that she is still having difficulties with her bowels. She stated that her bowels are dark in color. Please advise.

## 2013-02-22 NOTE — Telephone Encounter (Signed)
She should complete stool cards if these have not been performed if the stools are dark. She should also followup with gastroenterology; the chart indicates that she has seen them once; but they need to know symptoms persist.

## 2013-03-01 ENCOUNTER — Encounter: Payer: Self-pay | Admitting: *Deleted

## 2013-03-06 ENCOUNTER — Other Ambulatory Visit: Payer: Medicare Other

## 2013-03-10 ENCOUNTER — Telehealth: Payer: Self-pay | Admitting: Internal Medicine

## 2013-03-10 ENCOUNTER — Encounter: Payer: Self-pay | Admitting: Gastroenterology

## 2013-03-10 ENCOUNTER — Ambulatory Visit (INDEPENDENT_AMBULATORY_CARE_PROVIDER_SITE_OTHER): Payer: Medicare Other | Admitting: Gastroenterology

## 2013-03-10 VITALS — BP 98/70 | HR 59 | Ht 61.5 in | Wt 128.7 lb

## 2013-03-10 DIAGNOSIS — R198 Other specified symptoms and signs involving the digestive system and abdomen: Secondary | ICD-10-CM

## 2013-03-10 DIAGNOSIS — K5730 Diverticulosis of large intestine without perforation or abscess without bleeding: Secondary | ICD-10-CM

## 2013-03-10 DIAGNOSIS — K6389 Other specified diseases of intestine: Secondary | ICD-10-CM

## 2013-03-10 NOTE — Telephone Encounter (Signed)
03/10/2013  Pt came by office.  She is scheduled for knee replacement surgery 04/17/2013.  She goes back to see that Dr 03/20/2013, and needs medical clearance from Dr. Alwyn Ren to take to that appt on 03/20/2013.  Please mail letter to patient.  Pt also wants to inform you that her diarrhea has resolved as of Sunday, Oct 5th.  Pt is took Metamucil twice this week because she is feeling constipated now.  Please advise.

## 2013-03-10 NOTE — Patient Instructions (Signed)
Use Benefiber daily I TBS twice daily

## 2013-03-10 NOTE — Progress Notes (Signed)
History of Present Illness: This is a 79 year old white female who had an episode of diarrhea several weeks ago which resolved spontaneously and with discontinuation of Aricept.  If anything, she currently is mildly constipated using when necessary MiraLax.  Last colonoscopy was 9 years ago and showed diverticulosis and small hyperplastic polyp.  She denies melena, hematochezia, abdominal pain, upper GI or hepatobiliary complaints.  She's accompanied by her daughter today.    Current Medications, Allergies, Past Medical History, Past Surgical History, Family History and Social History were reviewed in [employer] record.  ROS: All systems were reviewed and are negative unless otherwise stated in the HPI.         Assessment and plan: Probable drug-induced diarrhea which is resolved, and she now has constipation and IBS problems.  I've urged her to take Benefiber 1 tablespoon twice a day with her meals and liberal by mouth fluids with when necessary MiraLax use.  Complete stool exams for O&P, pathogens, and C. difficile was negative recently.  She is on a probiotic per Dr. Alwyn Ren which have asked her to continue.  Please copy her primary care physician, referring physician, and pertinent subspecialists.

## 2013-03-15 NOTE — Telephone Encounter (Signed)
Spoke with the pt and informed her that we will fax her recent PE visit notes to Dr. Lequita Halt for medical clearance.  Pt agreed.  Faxed last PE visit to Dr. Aluisio.//AB/CMA

## 2013-03-21 ENCOUNTER — Other Ambulatory Visit: Payer: Self-pay | Admitting: *Deleted

## 2013-03-21 ENCOUNTER — Encounter: Payer: Self-pay | Admitting: *Deleted

## 2013-03-21 ENCOUNTER — Telehealth: Payer: Self-pay | Admitting: *Deleted

## 2013-03-21 DIAGNOSIS — I10 Essential (primary) hypertension: Secondary | ICD-10-CM

## 2013-03-21 MED ORDER — HYDROCHLOROTHIAZIDE 25 MG PO TABS: 12.5000 mg | ORAL_TABLET | Freq: Every day | ORAL | Status: DC

## 2013-03-21 NOTE — Telephone Encounter (Signed)
Patients daughter called with concerns that mother B/P is beginning to increase since all medications were discontinued. The readings have ranged from 142/80 to 170/68. They want to know if pt needs to start back taking B/P meds again. Please advise.

## 2013-03-21 NOTE — Telephone Encounter (Signed)
HCTZ 25 mg 1/2 qd if BP > 140/90 on average.Minimal Blood Pressure Goal= AVERAGE < 140/90;  Ideal is an AVERAGE < 135/85. This AVERAGE should be calculated from @ least 5-7 BP readings taken @ different times of day on different days of week. You should not respond to isolated BP readings , but rather the AVERAGE for that week .Please bring your  blood pressure cuff to office visits to verify that it is reliable.

## 2013-03-21 NOTE — Telephone Encounter (Signed)
Spoke with pts daughter made aware that Dr. Alwyn Ren wants pt to restart HCTZ at 1/2 tablet (12.5mg ) daily if B/P averages are greater than 140-90. Rx sent tot [facility].

## 2013-03-21 NOTE — Telephone Encounter (Signed)
Patient requesting office notes be faxed to Dr. Lequita Halt prior to upcoming surgery. This was done as well as surgical clearance letter.

## 2013-03-29 NOTE — Progress Notes (Signed)
Surgery scheduled for 04/17/13.  Need orders in EPIC.  Thank You.

## 2013-03-30 ENCOUNTER — Other Ambulatory Visit: Payer: Self-pay | Admitting: Orthopedic Surgery

## 2013-03-30 NOTE — Progress Notes (Signed)
Preoperative surgical orders have been place into the Epic hospital system for Shelly Ross on 03/30/2013, 5:24 PM  by Patrica Duel for surgery on 04/17/2013.  Preop Total Knee orders including Experal, PO Tylenol, and IV Decadron as long as there are no contraindications to the above medications. Avel Peace, PA-C

## 2013-04-04 ENCOUNTER — Ambulatory Visit: Payer: Medicare Other | Admitting: Diagnostic Neuroimaging

## 2013-04-05 ENCOUNTER — Encounter (HOSPITAL_COMMUNITY): Payer: Self-pay | Admitting: Pharmacy Technician

## 2013-04-06 ENCOUNTER — Other Ambulatory Visit: Payer: Self-pay

## 2013-04-07 ENCOUNTER — Other Ambulatory Visit (HOSPITAL_COMMUNITY): Payer: Self-pay | Admitting: Orthopedic Surgery

## 2013-04-07 NOTE — Progress Notes (Signed)
Medical clearance dr hopper on chart

## 2013-04-07 NOTE — Patient Instructions (Addendum)
20 Shelly Ross  04/07/2013   Your procedure is scheduled on: Monday November 17th  Report to [facility] at 515  AM.  Call this number if you have problems the morning of surgery [phone]   Remember:   Do not eat food or drink liquids :After Midnight.     Take these medicines the morning of surgery with A SIP OF WATER: levothyroxine                                SEE [name] PREPARING FOR SURGERY SHEET             You may not have any metal on your body including hair pins and piercings  Do not wear jewelry, make-up.  Do not wear lotions, powders, or perfumes. You may wear deodorant.   Men may shave face and neck.  Do not bring valuables to the hospital. [name] IS NOT RESPONSIBLE FOR VALUEABLES.  Contacts, dentures or bridgework may not be worn into surgery.  Leave suitcase in the car. After surgery it may be brought to your room.  For patients admitted to the hospital, checkout time is 11:00 AM the day of discharge.   Please read over the following fact sheets that you were given: MRSA information, blood fact sheet, incentive spirometer sheet  Call Birdie Sons RN pre op nurse if needed [phone]    FAILURE TO FOLLOW THESE INSTRUCTIONS MAY RESULT IN THE CANCELLATION OF YOUR SURGERY.  PATIENT SIGNATURE___________________________________________  NURSE SIGNATURE_____________________________________________

## 2013-04-10 ENCOUNTER — Ambulatory Visit (HOSPITAL_COMMUNITY)
Admission: RE | Admit: 2013-04-10 | Discharge: 2013-04-10 | Disposition: A | Discharge status: Home / Self Care | Payer: Medicare Other | Source: Ambulatory Visit | Attending: Orthopedic Surgery | Admitting: Orthopedic Surgery

## 2013-04-10 ENCOUNTER — Encounter (HOSPITAL_COMMUNITY)
Admission: RE | Admit: 2013-04-10 | Discharge: 2013-04-10 | Disposition: A | Discharge status: Home / Self Care | Payer: Medicare Other | Source: Ambulatory Visit | Attending: Orthopedic Surgery | Admitting: Orthopedic Surgery

## 2013-04-10 ENCOUNTER — Encounter (HOSPITAL_COMMUNITY): Payer: Self-pay

## 2013-04-10 DIAGNOSIS — Z01812 Encounter for preprocedural laboratory examination: Secondary | ICD-10-CM | POA: Insufficient documentation

## 2013-04-10 DIAGNOSIS — Z01818 Encounter for other preprocedural examination: Secondary | ICD-10-CM | POA: Insufficient documentation

## 2013-04-10 DIAGNOSIS — M899 Disorder of bone, unspecified: Secondary | ICD-10-CM | POA: Insufficient documentation

## 2013-04-10 DIAGNOSIS — M1710 Unilateral primary osteoarthritis, unspecified knee: Secondary | ICD-10-CM | POA: Insufficient documentation

## 2013-04-10 DIAGNOSIS — Z0183 Encounter for blood typing: Secondary | ICD-10-CM | POA: Insufficient documentation

## 2013-04-10 DIAGNOSIS — Z01810 Encounter for preprocedural cardiovascular examination: Secondary | ICD-10-CM | POA: Insufficient documentation

## 2013-04-10 DIAGNOSIS — M4120 Other idiopathic scoliosis, site unspecified: Secondary | ICD-10-CM | POA: Insufficient documentation

## 2013-04-10 HISTORY — DX: Hypothyroidism, unspecified: E03.9

## 2013-04-10 HISTORY — DX: Headache: R51

## 2013-04-10 LAB — CBC
HCT: 46.7 % — ABNORMAL HIGH (ref 36.0–46.0)
MCHC: 32.5 g/dL (ref 30.0–36.0)
Platelets: 297 10*3/uL (ref 150–400)
RDW: 12.9 % (ref 11.5–15.5)
WBC: 6 10*3/uL (ref 4.0–10.5)

## 2013-04-10 LAB — SURGICAL PCR SCREEN
MRSA, PCR: NEGATIVE
Staphylococcus aureus: NEGATIVE

## 2013-04-10 LAB — COMPREHENSIVE METABOLIC PANEL
AST: 24 U/L (ref 0–37)
Albumin: 3.7 g/dL (ref 3.5–5.2)
Alkaline Phosphatase: 70 U/L (ref 39–117)
BUN: 11 mg/dL (ref 6–23)
Chloride: 103 mEq/L (ref 96–112)
GFR calc Af Amer: 90 mL/min — ABNORMAL LOW (ref >90)
Glucose, Bld: 83 mg/dL (ref 70–99)
Potassium: 3.6 mEq/L (ref 3.5–5.1)
Sodium: 138 mEq/L (ref 135–145)
Total Bilirubin: 0.4 mg/dL (ref 0.3–1.2)
Total Protein: 7.3 g/dL (ref 6.0–8.3)

## 2013-04-10 LAB — APTT: aPTT: 30 seconds (ref 24–37)

## 2013-04-10 LAB — URINALYSIS, ROUTINE W REFLEX MICROSCOPIC
Hgb urine dipstick: NEGATIVE
Nitrite: NEGATIVE
Specific Gravity, Urine: 1.013 (ref 1.005–1.030)
Urobilinogen, UA: 0.2 mg/dL (ref 0.0–1.0)
pH: 7 (ref 5.0–8.0)

## 2013-04-10 LAB — PROTIME-INR
INR: 0.94 (ref 0.00–1.49)
Prothrombin Time: 12.4 seconds (ref 11.6–15.2)

## 2013-04-10 NOTE — Progress Notes (Addendum)
Patient says dr Lequita Halt told her she would get a left knee injection with her right tka surgery, please let us know if left knee injection needs to be added to surgical consent, please fax responses to [phone] attention Reyes [place], thanks

## 2013-04-11 ENCOUNTER — Encounter: Payer: Self-pay | Admitting: Internal Medicine

## 2013-04-11 NOTE — H&P (Signed)
TOTAL KNEE ADMISSION H&P  Patient is being admitted for right total knee arthroplasty.  Subjective:  Chief Complaint:right knee pain.  HPI: Shelly Ross, 79 y.o. female, has a history of pain and functional disability in the right knee due to arthritis and has failed non-surgical conservative treatments for greater than 12 weeks to includeNSAID's and/or analgesics, corticosteriod injections and activity modification.  Onset of symptoms was gradual, starting 7 years ago with gradually worsening course since that time. The patient noted no past surgery on the right knee(s).  Patient currently rates pain in the right knee(s) at 7 out of 10 with activity. Patient has night pain, worsening of pain with activity and weight bearing, pain that interferes with activities of daily living, pain with passive range of motion, crepitus and joint swelling.  Patient has evidence of periarticular osteophytes and joint space narrowing by imaging studies.  There is no active infection.  Patient Active Problem List   Diagnosis Date Noted  . DJD (degenerative joint disease) of knee 02/15/2013  . Diarrhea 02/10/2013  . Hypercalcemia 01/17/2013  . Memory loss 09/09/2012  . Varicose veins of lower extremities with other complications 06/15/2011  . ANXIETY 07/23/2010  . DEGENERATIVE DISC DISEASE, LUMBAR SPINE 07/09/2009  . EPISTAXIS 07/09/2009  . OSTEOPENIA 02/08/2009  . PALPITATIONS, RECURRENT 10/22/2008  . HYPERTENSION 01/12/2008  . PREMATURE VENTRICULAR CONTRACTIONS 01/12/2008  . DIZZINESS 01/12/2008  . HYPOTHYROIDISM 07/18/2007  . HYPERLIPIDEMIA 07/18/2007  . GERD 07/18/2007  . DEGENERATIVE DISC DISEASE, CERVICAL SPINE, W/RADICULOPATHY 07/18/2007   Past Medical History  Diagnosis Date  . Chronic low back pain   . Dizziness     some  . Hypertension   . Neuralgia, neuritis, and radiculitis, unspecified   . Degenerative disc disease     CERVICAL SPINE,W/RADICULOPATHY  . GERD (gastroesophageal reflux  disease)   . Hyperlipidemia   . Thyroid disease     HYPOTHYROIDISM  . Osteoporosis   . Rib fractures   . Numbness 08/21/12     [facility], right foot  . Stricture and stenosis of esophagus 2007  . Adenomatous colon polyp 2003  . Premature ventricular contractions 2006  . Atrial fibrillation   . Hypothyroidism   . Headache(784.0)     not migraines, but a lot of headaches  . Hepatitis     jaundice as child  . Anginal pain     pt reports chest pain within the past year, none recent. will repeat EKG     Past Surgical History  Procedure Laterality Date  . Abdominal hysterectomy      WITH OOPHORECTOMY,? BILATERAL FOR ENDOMETRIOSIS  . Breast enhancement surgery    . G2 p2    . Bladder tacking    . Endovenous ablation saphenous vein w/ laser  07-13-2011    right greater saphenous vein, Dr Jerilee Field  . Endovenous ablation saphenous vein w/ laser  08/2011    L ; Dr Hart [place]  . Cataract extraction Bilateral   . Esophageal dilation       X 1  . Colonoscopy    . Cardiac electrophysiology mapping and ablation       Current outpatient prescriptions: acidophilus (RISAQUAD) CAPS capsule, Take 1 capsule by mouth daily., Disp: , Rfl: ;   hydrochlorothiazide (HYDRODIURIL) 25 MG tablet, Take 12.5 mg by mouth daily as needed (Only if BP BOTTOM NUMBER IS  over 85). , Disp: , Rfl: ;   levothyroxine (SYNTHROID, LEVOTHROID) 75 MCG tablet, Take 37.5 mcg by mouth daily  before breakfast. , Disp: , Rfl:   Allergies  Allergen Reactions  . Percocet [Oxycodone-Acetaminophen] Nausea And Vomiting  . Lansoprazole Other (See Comments)    Induced nausea and possibly headaches    History  Substance Use Topics  . Smoking status: Never Smoker   . Smokeless tobacco: Never Used  . Alcohol Use: Yes     Comment: wine, seldom     Family History  Problem Relation Age of Onset  . Stroke Mother 44  . Heart attack Father     in 87s  . Deep vein thrombosis Brother     AND PTE  . Diabetes Brother   .  Cancer Paternal Aunt      INTESTINAL   . Alzheimer's disease Sister   . Alzheimer's disease Brother   . Tuberculosis Brother   . Aneurysm Brother     cns  . Heart Problems    . COPD Sister     smoking  . Heart attack Sister 64     Review of Systems  Constitutional: Positive for malaise/fatigue. Negative for fever, chills and diaphoresis.  HENT: Positive for hearing loss. Negative for congestion, ear discharge, ear pain, nosebleeds, sore throat and tinnitus.   Eyes: Negative.   Respiratory: Negative.  Negative for stridor.   Cardiovascular: Negative.   Gastrointestinal: Negative.   Genitourinary: Positive for frequency. Negative for dysuria, urgency, hematuria and flank pain.  Musculoskeletal: Positive for back pain and joint pain. Negative for falls, myalgias and neck pain.       Right knee pain  Skin: Negative.   Neurological: Positive for headaches. Negative for dizziness, tingling, tremors, sensory change, speech change, focal weakness, seizures, loss of consciousness and weakness.  Endo/Heme/Allergies: Negative.   Psychiatric/Behavioral: Negative.     Objective:  Physical Exam  Constitutional: She is oriented to person, place, and time. She appears well-developed and well-nourished. No distress.  HENT:  Head: Normocephalic and atraumatic.  Right Ear: External ear normal.  Left Ear: External ear normal.  Nose: Nose normal.  Mouth/Throat: Oropharynx is clear and moist.  Eyes: Conjunctivae and EOM are normal.  Neck: Normal range of motion. Neck supple.  Cardiovascular: Normal rate, regular rhythm, normal heart sounds and intact distal pulses.   No murmur heard. Respiratory: Effort normal and breath sounds normal. No respiratory distress. She has no wheezes.  GI: Soft. Bowel sounds are normal. She exhibits no distension. There is no tenderness.  Musculoskeletal:  Both hips show normal range of motion with no discomfort. Her left knee shows no effusion. Range about 5 to  125. Moderate crepitus on range of motion, slight tenderness lateral greater than medial with no instability. Right knee no effusion. Marked crepitus on range of motion. Range 5 to 120. Tender medial and lateral with no instability. She has a very loud pop when she flexes her knee and the patella seems to shift a little bit when she flexes.  Neurological: She is alert and oriented to person, place, and time. She has normal strength and normal reflexes. No sensory deficit.  Skin: No rash noted. She is not diaphoretic. No erythema.  Psychiatric: She has a normal mood and affect. Her behavior is normal.   Vitals Weight: 128 lb Height: 61 in Body Surface Area: 1.58 m Body Mass Index: 24.19 kg/m Pulse: 66 (Regular) BP: 144/88 (Sitting, Left Arm, Standard)   Imaging Review Plain radiographs demonstrate severe degenerative joint disease of the right knee(s). The overall alignment ismild valgus. The bone quality appears to be  fair for age and reported activity level.  Assessment/Plan:  End stage arthritis, right knee   The patient history, physical examination, clinical judgment of the provider and imaging studies are consistent with end stage degenerative joint disease of the right knee(s) and total knee arthroplasty is deemed medically necessary. The treatment options including medical management, injection therapy arthroscopy and arthroplasty were discussed at length. The risks and benefits of total knee arthroplasty were presented and reviewed. The risks due to aseptic loosening, infection, stiffness, patella tracking problems, thromboembolic complications and other imponderables were discussed. The patient acknowledged the explanation, agreed to proceed with the plan and consent was signed. Patient is being admitted for inpatient treatment for surgery, pain control, PT, OT, prophylactic antibiotics, VTE prophylaxis, progressive ambulation and ADL's and discharge planning. The patient is  planning to be discharged to skilled nursing facility [facility] and [facility])     [place], [place]

## 2013-04-12 NOTE — Progress Notes (Signed)
Faxed received from Dr. Lequita Halt about adding left knee injection to consent. Consent order has been changed and fax was placed on chart.

## 2013-04-16 NOTE — Anesthesia Preprocedure Evaluation (Addendum)
Anesthesia Evaluation  Patient identified by MRN, date of birth, ID band Patient awake    Reviewed: Allergy & Precautions, H&P , NPO status , Patient's Chart, lab work & pertinent test results  Airway Mallampati: II TM Distance: >3 FB Neck ROM: full    Dental  (+) Caps and Dental Advisory Given Upper front two teeth capped:   Pulmonary neg pulmonary ROS,  breath sounds clear to auscultation  Pulmonary exam normal       Cardiovascular Exercise Tolerance: Good hypertension, Pt. on medications + dysrhythmias Atrial Fibrillation Rhythm:regular Rate:Normal  History ablation for AF.   Recurrent palpitations   Neuro/Psych Cervical radiculopathy negative neurological ROS  negative psych ROS   GI/Hepatic negative GI ROS, Neg liver ROS, GERD-  Medicated and Controlled,Esophageal stricture with dilation   Endo/Other  negative endocrine ROSHypothyroidism   Renal/GU negative Renal ROS  negative genitourinary   Musculoskeletal   Abdominal   Peds  Hematology negative hematology ROS (+)   Anesthesia Other Findings   Reproductive/Obstetrics negative OB ROS                          Anesthesia Physical Anesthesia Plan  ASA: III  Anesthesia Plan: Spinal   Post-op Pain Management:    Induction:   Airway Management Planned: Simple Face Mask  Additional Equipment:   Intra-op Plan:   Post-operative Plan:   Informed Consent: I have reviewed the patients History and Physical, chart, labs and discussed the procedure including the risks, benefits and alternatives for the proposed anesthesia with the patient or authorized representative who has indicated his/her understanding and acceptance.   Dental Advisory Given  Plan Discussed with: CRNA and Surgeon  Anesthesia Plan Comments:        Anesthesia Quick Evaluation

## 2013-04-17 ENCOUNTER — Ambulatory Visit (HOSPITAL_COMMUNITY): Payer: Medicare Other | Admitting: Anesthesiology

## 2013-04-17 ENCOUNTER — Encounter (HOSPITAL_COMMUNITY): Payer: Self-pay

## 2013-04-17 ENCOUNTER — Inpatient Hospital Stay (HOSPITAL_COMMUNITY)
Admission: RE | Admit: 2013-04-17 | Discharge: 2013-04-22 | DRG: 470 | Disposition: A | Discharge status: Skilled Nursing Facility | Payer: Medicare Other | Source: Ambulatory Visit | Attending: Orthopedic Surgery | Admitting: Orthopedic Surgery

## 2013-04-17 ENCOUNTER — Encounter (HOSPITAL_COMMUNITY)
Admission: RE | Disposition: A | Discharge status: Skilled Nursing Facility | Payer: Self-pay | Source: Ambulatory Visit | Attending: Orthopedic Surgery

## 2013-04-17 ENCOUNTER — Encounter (HOSPITAL_COMMUNITY): Payer: Medicare Other | Admitting: Anesthesiology

## 2013-04-17 DIAGNOSIS — I441 Atrioventricular block, second degree: Secondary | ICD-10-CM

## 2013-04-17 DIAGNOSIS — I498 Other specified cardiac arrhythmias: Secondary | ICD-10-CM

## 2013-04-17 DIAGNOSIS — E785 Hyperlipidemia, unspecified: Secondary | ICD-10-CM | POA: Diagnosis present

## 2013-04-17 DIAGNOSIS — I10 Essential (primary) hypertension: Secondary | ICD-10-CM | POA: Diagnosis present

## 2013-04-17 DIAGNOSIS — Z96651 Presence of right artificial knee joint: Secondary | ICD-10-CM

## 2013-04-17 DIAGNOSIS — M810 Age-related osteoporosis without current pathological fracture: Secondary | ICD-10-CM | POA: Diagnosis present

## 2013-04-17 DIAGNOSIS — E871 Hypo-osmolality and hyponatremia: Secondary | ICD-10-CM

## 2013-04-17 DIAGNOSIS — E876 Hypokalemia: Secondary | ICD-10-CM

## 2013-04-17 DIAGNOSIS — E039 Hypothyroidism, unspecified: Secondary | ICD-10-CM | POA: Diagnosis present

## 2013-04-17 DIAGNOSIS — I4891 Unspecified atrial fibrillation: Secondary | ICD-10-CM | POA: Diagnosis present

## 2013-04-17 DIAGNOSIS — K219 Gastro-esophageal reflux disease without esophagitis: Secondary | ICD-10-CM | POA: Diagnosis present

## 2013-04-17 DIAGNOSIS — M1710 Unilateral primary osteoarthritis, unspecified knee: Principal | ICD-10-CM | POA: Diagnosis present

## 2013-04-17 DIAGNOSIS — R413 Other amnesia: Secondary | ICD-10-CM | POA: Diagnosis present

## 2013-04-17 DIAGNOSIS — R001 Bradycardia, unspecified: Secondary | ICD-10-CM

## 2013-04-17 DIAGNOSIS — M179 Osteoarthritis of knee, unspecified: Secondary | ICD-10-CM

## 2013-04-17 DIAGNOSIS — IMO0002 Reserved for concepts with insufficient information to code with codable children: Secondary | ICD-10-CM

## 2013-04-17 DIAGNOSIS — D62 Acute posthemorrhagic anemia: Secondary | ICD-10-CM

## 2013-04-17 DIAGNOSIS — R079 Chest pain, unspecified: Secondary | ICD-10-CM

## 2013-04-17 HISTORY — DX: Supraventricular tachycardia: I47.1

## 2013-04-17 HISTORY — DX: Supraventricular tachycardia, unspecified: I47.10

## 2013-04-17 HISTORY — PX: TOTAL KNEE ARTHROPLASTY: SHX125

## 2013-04-17 LAB — CBC
Hemoglobin: 12.7 g/dL (ref 12.0–15.0)
MCH: 30.5 pg (ref 26.0–34.0)
MCHC: 33.4 g/dL (ref 30.0–36.0)
MCV: 91.1 fL (ref 78.0–100.0)
Platelets: 228 10*3/uL (ref 150–400)
RBC: 4.17 MIL/uL (ref 3.87–5.11)

## 2013-04-17 LAB — BASIC METABOLIC PANEL
BUN: 14 mg/dL (ref 6–23)
CO2: 25 mEq/L (ref 19–32)
Calcium: 9.1 mg/dL (ref 8.4–10.5)
Creatinine, Ser: 0.56 mg/dL (ref 0.50–1.10)
GFR calc non Af Amer: 82 mL/min — ABNORMAL LOW (ref >90)
Glucose, Bld: 155 mg/dL — ABNORMAL HIGH (ref 70–99)
Potassium: 3.2 mEq/L — ABNORMAL LOW (ref 3.5–5.1)
Sodium: 140 mEq/L (ref 135–145)

## 2013-04-17 LAB — TYPE AND SCREEN
ABO/RH(D): O POS
Antibody Screen: NEGATIVE

## 2013-04-17 SURGERY — ARTHROPLASTY, KNEE, TOTAL
Anesthesia: Spinal | Site: Knee | Laterality: Right | Wound class: Clean

## 2013-04-17 MED ORDER — BUPIVACAINE LIPOSOME 1.3 % IJ SUSP
20.0000 mL | Freq: Once | INTRAMUSCULAR | Status: DC
  Filled 2013-04-17: qty 20

## 2013-04-17 MED ORDER — HYDROMORPHONE HCL PF 1 MG/ML IJ SOLN
0.2500 mg | INTRAMUSCULAR | Status: DC | PRN
  Administered 2013-04-17: 0.5 mg via INTRAVENOUS

## 2013-04-17 MED ORDER — ONDANSETRON HCL 4 MG/2ML IJ SOLN
4.0000 mg | Freq: Four times a day (QID) | INTRAMUSCULAR | Status: DC | PRN
  Administered 2013-04-19: 4 mg via INTRAVENOUS
  Filled 2013-04-17: qty 2

## 2013-04-17 MED ORDER — GLYCOPYRROLATE 0.2 MG/ML IJ SOLN
INTRAMUSCULAR | Status: DC | PRN
  Administered 2013-04-17: 0.2 mg via INTRAVENOUS

## 2013-04-17 MED ORDER — POLYETHYLENE GLYCOL 3350 17 G PO PACK
17.0000 g | PACK | Freq: Every day | ORAL | Status: DC | PRN
  Administered 2013-04-21: 17 g via ORAL
  Filled 2013-04-17 (×2): qty 1

## 2013-04-17 MED ORDER — LACTATED RINGERS IV SOLN: INTRAVENOUS | Status: DC

## 2013-04-17 MED ORDER — LIDOCAINE HCL 1 % IJ SOLN
INTRAMUSCULAR | Status: AC
  Filled 2013-04-17: qty 20

## 2013-04-17 MED ORDER — PROPOFOL INFUSION 10 MG/ML OPTIME
INTRAVENOUS | Status: DC | PRN
  Administered 2013-04-17: 25 ug/kg/min via INTRAVENOUS

## 2013-04-17 MED ORDER — ACETAMINOPHEN 650 MG RE SUPP: 650.0000 mg | Freq: Four times a day (QID) | RECTAL | Status: DC | PRN

## 2013-04-17 MED ORDER — TRAMADOL HCL 50 MG PO TABS
50.0000 mg | ORAL_TABLET | Freq: Four times a day (QID) | ORAL | Status: DC | PRN
  Administered 2013-04-19 – 2013-04-22 (×5): 100 mg via ORAL
  Filled 2013-04-17: qty 2
  Filled 2013-04-17: qty 1
  Filled 2013-04-17 (×5): qty 2

## 2013-04-17 MED ORDER — HYDROCHLOROTHIAZIDE 12.5 MG PO CAPS
12.5000 mg | ORAL_CAPSULE | Freq: Every day | ORAL | Status: DC | PRN
  Filled 2013-04-17 (×2): qty 1

## 2013-04-17 MED ORDER — HYDROCHLOROTHIAZIDE 25 MG PO TABS
12.5000 mg | ORAL_TABLET | Freq: Every day | ORAL | Status: DC | PRN
  Filled 2013-04-17: qty 0.5

## 2013-04-17 MED ORDER — LACTATED RINGERS IV SOLN
INTRAVENOUS | Status: DC | PRN
  Administered 2013-04-17 (×2): via INTRAVENOUS

## 2013-04-17 MED ORDER — METHYLPREDNISOLONE ACETATE 80 MG/ML IJ SUSP
INTRAMUSCULAR | Status: DC | PRN
  Administered 2013-04-17: 80 mg via INTRA_ARTICULAR

## 2013-04-17 MED ORDER — LIDOCAINE HCL (CARDIAC) 20 MG/ML IV SOLN
INTRAVENOUS | Status: DC | PRN
  Administered 2013-04-17: 50 mg via INTRAVENOUS

## 2013-04-17 MED ORDER — ACETAMINOPHEN 500 MG PO TABS
1000.0000 mg | ORAL_TABLET | Freq: Four times a day (QID) | ORAL | Status: AC
  Administered 2013-04-17 – 2013-04-18 (×3): 1000 mg via ORAL
  Filled 2013-04-17 (×3): qty 2

## 2013-04-17 MED ORDER — PHENOL 1.4 % MT LIQD: 1.0000 | OROMUCOSAL | Status: DC | PRN

## 2013-04-17 MED ORDER — SODIUM CHLORIDE 0.9 % IR SOLN
Status: DC | PRN
  Administered 2013-04-17: 1000 mL

## 2013-04-17 MED ORDER — EPHEDRINE SULFATE 50 MG/ML IJ SOLN
INTRAMUSCULAR | Status: DC | PRN
  Administered 2013-04-17: 5 mg via INTRAVENOUS
  Administered 2013-04-17 (×2): 10 mg via INTRAVENOUS

## 2013-04-17 MED ORDER — ONDANSETRON HCL 4 MG PO TABS: 4.0000 mg | ORAL_TABLET | Freq: Four times a day (QID) | ORAL | Status: DC | PRN

## 2013-04-17 MED ORDER — PROPOFOL 10 MG/ML IV BOLUS
INTRAVENOUS | Status: DC | PRN
  Administered 2013-04-17 (×2): 20 mg via INTRAVENOUS

## 2013-04-17 MED ORDER — DIPHENHYDRAMINE HCL 12.5 MG/5ML PO ELIX
12.5000 mg | ORAL_SOLUTION | ORAL | Status: DC | PRN
  Administered 2013-04-18 – 2013-04-20 (×2): 25 mg via ORAL
  Filled 2013-04-17 (×2): qty 10

## 2013-04-17 MED ORDER — DEXAMETHASONE SODIUM PHOSPHATE 10 MG/ML IJ SOLN
10.0000 mg | Freq: Every day | INTRAMUSCULAR | Status: AC
Start: 2013-04-18 — End: 2013-04-18
  Administered 2013-04-18: 10 mg via INTRAVENOUS
  Filled 2013-04-17: qty 1

## 2013-04-17 MED ORDER — KCL IN DEXTROSE-NACL 20-5-0.9 MEQ/L-%-% IV SOLN
INTRAVENOUS | Status: DC
  Administered 2013-04-17 – 2013-04-19 (×4): via INTRAVENOUS
  Filled 2013-04-17 (×5): qty 1000

## 2013-04-17 MED ORDER — CEFAZOLIN SODIUM-DEXTROSE 2-3 GM-% IV SOLR
INTRAVENOUS | Status: AC
  Filled 2013-04-17: qty 50

## 2013-04-17 MED ORDER — SODIUM CHLORIDE 0.9 % IJ SOLN
INTRAMUSCULAR | Status: DC | PRN
  Administered 2013-04-17: 08:00:00

## 2013-04-17 MED ORDER — HYDROMORPHONE HCL PF 1 MG/ML IJ SOLN
0.5000 mg | INTRAMUSCULAR | Status: DC | PRN
  Administered 2013-04-17: 1 mg via INTRAVENOUS
  Administered 2013-04-18: 0.5 mg via INTRAVENOUS
  Administered 2013-04-19: 1 mg via INTRAVENOUS
  Filled 2013-04-17 (×3): qty 1

## 2013-04-17 MED ORDER — HYDROMORPHONE HCL 2 MG PO TABS
2.0000 mg | ORAL_TABLET | ORAL | Status: DC | PRN
  Administered 2013-04-17 – 2013-04-19 (×7): 2 mg via ORAL
  Filled 2013-04-17 (×8): qty 1

## 2013-04-17 MED ORDER — DOCUSATE SODIUM 100 MG PO CAPS
100.0000 mg | ORAL_CAPSULE | Freq: Two times a day (BID) | ORAL | Status: DC
  Administered 2013-04-17 – 2013-04-22 (×11): 100 mg via ORAL
  Filled 2013-04-17 (×14): qty 1

## 2013-04-17 MED ORDER — BUPIVACAINE LIPOSOME 1.3 % IJ SUSP: INTRAMUSCULAR | Status: DC | PRN

## 2013-04-17 MED ORDER — BUPIVACAINE HCL 0.25 % IJ SOLN
INTRAMUSCULAR | Status: DC | PRN
  Administered 2013-04-17: 20 mL

## 2013-04-17 MED ORDER — ONDANSETRON HCL 4 MG/2ML IJ SOLN
INTRAMUSCULAR | Status: DC | PRN
  Administered 2013-04-17: 4 mg via INTRAVENOUS

## 2013-04-17 MED ORDER — FENTANYL CITRATE 0.05 MG/ML IJ SOLN
INTRAMUSCULAR | Status: DC | PRN
  Administered 2013-04-17: 100 ug via INTRAVENOUS

## 2013-04-17 MED ORDER — DEXAMETHASONE SODIUM PHOSPHATE 10 MG/ML IJ SOLN
10.0000 mg | Freq: Once | INTRAMUSCULAR | Status: AC
  Administered 2013-04-17: 10 mg via INTRAVENOUS

## 2013-04-17 MED ORDER — METHOCARBAMOL 500 MG PO TABS
500.0000 mg | ORAL_TABLET | Freq: Four times a day (QID) | ORAL | Status: DC | PRN
  Administered 2013-04-20 – 2013-04-21 (×4): 500 mg via ORAL
  Filled 2013-04-17 (×4): qty 1

## 2013-04-17 MED ORDER — ACETAMINOPHEN 500 MG PO TABS
1000.0000 mg | ORAL_TABLET | Freq: Once | ORAL | Status: AC
  Administered 2013-04-17: 1000 mg via ORAL
  Filled 2013-04-17: qty 2

## 2013-04-17 MED ORDER — KETOROLAC TROMETHAMINE 15 MG/ML IJ SOLN: 7.5000 mg | Freq: Four times a day (QID) | INTRAMUSCULAR | Status: AC | PRN

## 2013-04-17 MED ORDER — BISACODYL 10 MG RE SUPP
10.0000 mg | Freq: Every day | RECTAL | Status: DC | PRN
  Filled 2013-04-17 (×2): qty 1

## 2013-04-17 MED ORDER — CEFAZOLIN SODIUM-DEXTROSE 2-3 GM-% IV SOLR
2.0000 g | INTRAVENOUS | Status: AC
  Administered 2013-04-17: 2 g via INTRAVENOUS

## 2013-04-17 MED ORDER — SODIUM CHLORIDE 0.9 % IV SOLN: INTRAVENOUS | Status: DC

## 2013-04-17 MED ORDER — SODIUM CHLORIDE 0.9 % IJ SOLN
INTRAMUSCULAR | Status: AC
  Filled 2013-04-17: qty 50

## 2013-04-17 MED ORDER — ACETAMINOPHEN 325 MG PO TABS: 650.0000 mg | ORAL_TABLET | Freq: Four times a day (QID) | ORAL | Status: DC | PRN

## 2013-04-17 MED ORDER — METOCLOPRAMIDE HCL 10 MG PO TABS
5.0000 mg | ORAL_TABLET | Freq: Three times a day (TID) | ORAL | Status: DC | PRN
  Filled 2013-04-17: qty 1

## 2013-04-17 MED ORDER — METHOCARBAMOL 100 MG/ML IJ SOLN
500.0000 mg | Freq: Four times a day (QID) | INTRAVENOUS | Status: DC | PRN
  Administered 2013-04-17: 500 mg via INTRAVENOUS
  Filled 2013-04-17: qty 5

## 2013-04-17 MED ORDER — LEVOTHYROXINE SODIUM 75 MCG PO TABS
37.5000 ug | ORAL_TABLET | Freq: Every day | ORAL | Status: DC
  Administered 2013-04-18 – 2013-04-22 (×5): 37.5 ug via ORAL
  Filled 2013-04-17 (×7): qty 0.5

## 2013-04-17 MED ORDER — BUPIVACAINE HCL (PF) 0.25 % IJ SOLN
INTRAMUSCULAR | Status: AC
  Filled 2013-04-17: qty 30

## 2013-04-17 MED ORDER — FLEET ENEMA 7-19 GM/118ML RE ENEM: 1.0000 | ENEMA | Freq: Once | RECTAL | Status: AC | PRN

## 2013-04-17 MED ORDER — DEXAMETHASONE 6 MG PO TABS
10.0000 mg | ORAL_TABLET | Freq: Every day | ORAL | Status: AC
  Filled 2013-04-17: qty 1

## 2013-04-17 MED ORDER — BUPIVACAINE HCL (PF) 0.75 % IJ SOLN
INTRAMUSCULAR | Status: DC | PRN
  Administered 2013-04-17: 13 mg via INTRATHECAL

## 2013-04-17 MED ORDER — MENTHOL 3 MG MT LOZG: 1.0000 | LOZENGE | OROMUCOSAL | Status: DC | PRN

## 2013-04-17 MED ORDER — HYDROMORPHONE HCL PF 1 MG/ML IJ SOLN
INTRAMUSCULAR | Status: AC
  Filled 2013-04-17: qty 1

## 2013-04-17 MED ORDER — 0.9 % SODIUM CHLORIDE (POUR BTL) OPTIME
TOPICAL | Status: DC | PRN
  Administered 2013-04-17: 1000 mL

## 2013-04-17 MED ORDER — RIVAROXABAN 10 MG PO TABS
10.0000 mg | ORAL_TABLET | Freq: Every day | ORAL | Status: DC
  Administered 2013-04-18 – 2013-04-19 (×2): 10 mg via ORAL
  Filled 2013-04-17 (×3): qty 1

## 2013-04-17 MED ORDER — CEFAZOLIN SODIUM 1-5 GM-% IV SOLN
1.0000 g | Freq: Four times a day (QID) | INTRAVENOUS | Status: AC
  Administered 2013-04-17 (×2): 1 g via INTRAVENOUS
  Filled 2013-04-17 (×2): qty 50

## 2013-04-17 MED ORDER — METOCLOPRAMIDE HCL 5 MG/ML IJ SOLN
5.0000 mg | Freq: Three times a day (TID) | INTRAMUSCULAR | Status: DC | PRN
  Administered 2013-04-18: 5 mg via INTRAVENOUS
  Filled 2013-04-17 (×2): qty 2

## 2013-04-17 MED ORDER — METHYLPREDNISOLONE ACETATE 40 MG/ML IJ SUSP
INTRAMUSCULAR | Status: AC
  Filled 2013-04-17: qty 2

## 2013-04-17 SURGICAL SUPPLY — 54 items
BAG ZIPLOCK 12X15 (MISCELLANEOUS) ×2 IMPLANT
BANDAGE ELASTIC 6 VELCRO ST LF (GAUZE/BANDAGES/DRESSINGS) ×2 IMPLANT
BANDAGE ESMARK 6X9 LF (GAUZE/BANDAGES/DRESSINGS) ×1 IMPLANT
BLADE SAG 18X100X1.27 (BLADE) ×2 IMPLANT
BLADE SAW SGTL 11.0X1.19X90.0M (BLADE) ×2 IMPLANT
BNDG ESMARK 6X9 LF (GAUZE/BANDAGES/DRESSINGS) ×2
BOWL SMART MIX CTS (DISPOSABLE) ×2 IMPLANT
CAPT RP KNEE ×2 IMPLANT
CEMENT HV SMART SET (Cement) ×4 IMPLANT
CUFF TOURN SGL QUICK 34 (TOURNIQUET CUFF) ×1
CUFF TRNQT CYL 34X4X40X1 (TOURNIQUET CUFF) ×1 IMPLANT
DECANTER SPIKE VIAL GLASS SM (MISCELLANEOUS) ×2 IMPLANT
DRAPE EXTREMITY T 121X128X90 (DRAPE) ×2 IMPLANT
DRAPE POUCH INSTRU U-SHP 10X18 (DRAPES) ×2 IMPLANT
DRAPE U-SHAPE 47X51 STRL (DRAPES) ×2 IMPLANT
DRSG ADAPTIC 3X8 NADH LF (GAUZE/BANDAGES/DRESSINGS) ×2 IMPLANT
DRSG PAD ABDOMINAL 8X10 ST (GAUZE/BANDAGES/DRESSINGS) ×2 IMPLANT
DURAPREP 26ML APPLICATOR (WOUND CARE) ×2 IMPLANT
ELECT REM PT RETURN 9FT ADLT (ELECTROSURGICAL) ×2
ELECTRODE REM PT RTRN 9FT ADLT (ELECTROSURGICAL) ×1 IMPLANT
EVACUATOR 1/8 PVC DRAIN (DRAIN) ×2 IMPLANT
FACESHIELD LNG OPTICON STERILE (SAFETY) ×10 IMPLANT
GLOVE BIO SURGEON STRL SZ7.5 (GLOVE) IMPLANT
GLOVE BIO SURGEON STRL SZ8 (GLOVE) ×2 IMPLANT
GLOVE BIOGEL PI IND STRL 8 (GLOVE) ×1 IMPLANT
GLOVE BIOGEL PI INDICATOR 8 (GLOVE) ×1
GLOVE SURG SS PI 6.5 STRL IVOR (GLOVE) ×4 IMPLANT
GOWN PREVENTION PLUS LG XLONG (DISPOSABLE) ×4 IMPLANT
GOWN STRL REIN XL XLG (GOWN DISPOSABLE) IMPLANT
HANDPIECE INTERPULSE COAX TIP (DISPOSABLE) ×1
IMMOBILIZER KNEE 20 (SOFTGOODS) ×2
IMMOBILIZER KNEE 20 THIGH 36 (SOFTGOODS) ×1 IMPLANT
KIT BASIN OR (CUSTOM PROCEDURE TRAY) ×2 IMPLANT
MANIFOLD NEPTUNE II (INSTRUMENTS) ×2 IMPLANT
NDL SAFETY ECLIPSE 18X1.5 (NEEDLE) ×2 IMPLANT
NEEDLE HYPO 18GX1.5 SHARP (NEEDLE) ×2
NS IRRIG 1000ML POUR BTL (IV SOLUTION) ×2 IMPLANT
PACK TOTAL JOINT (CUSTOM PROCEDURE TRAY) ×2 IMPLANT
PADDING CAST COTTON 6X4 STRL (CAST SUPPLIES) ×6 IMPLANT
POSITIONER SURGICAL ARM (MISCELLANEOUS) ×2 IMPLANT
SET HNDPC FAN SPRY TIP SCT (DISPOSABLE) ×1 IMPLANT
SPONGE GAUZE 4X4 12PLY (GAUZE/BANDAGES/DRESSINGS) ×2 IMPLANT
STRIP CLOSURE SKIN 1/2X4 (GAUZE/BANDAGES/DRESSINGS) ×4 IMPLANT
SUCTION FRAZIER 12FR DISP (SUCTIONS) ×2 IMPLANT
SUT MNCRL AB 4-0 PS2 18 (SUTURE) ×2 IMPLANT
SUT VIC AB 2-0 CT1 27 (SUTURE) ×3
SUT VIC AB 2-0 CT1 TAPERPNT 27 (SUTURE) ×3 IMPLANT
SUT VLOC 180 0 24IN GS25 (SUTURE) ×2 IMPLANT
SYR 20CC LL (SYRINGE) ×2 IMPLANT
SYR 50ML LL SCALE MARK (SYRINGE) ×2 IMPLANT
TOWEL OR 17X26 10 PK STRL BLUE (TOWEL DISPOSABLE) ×2 IMPLANT
TRAY FOLEY CATH 14FRSI W/METER (CATHETERS) ×2 IMPLANT
WATER STERILE IRR 1500ML POUR (IV SOLUTION) ×4 IMPLANT
WRAP KNEE MAXI GEL POST OP (GAUZE/BANDAGES/DRESSINGS) ×2 IMPLANT

## 2013-04-17 NOTE — Interval H&P Note (Signed)
History and Physical Interval Note:  04/17/2013 6:57 AM  Shelly Ross  has presented today for surgery, with the diagnosis of OA RIGHT KNEE  The various methods of treatment have been discussed with the patient and family. After consideration of risks, benefits and other options for treatment, the patient has consented to  Procedure(s): RIGHT TOTAL KNEE ARTHROPLASTY (Right) as a surgical intervention .  The patient's history has been reviewed, patient examined, no change in status, stable for surgery.  I have reviewed the patient's chart and labs.  Questions were answered to the patient's satisfaction.     Loanne Drilling

## 2013-04-17 NOTE — Anesthesia Procedure Notes (Signed)
Spinal  Patient location during procedure: OR Start time: 04/17/2013 7:10 AM End time: 04/17/2013 7:22 AM Staffing Anesthesiologist: Ronelle Nigh L Performed by: anesthesiologist  Preanesthetic Checklist Completed: patient identified, site marked, surgical consent, pre-op evaluation, timeout performed, IV checked, risks and benefits discussed and monitors and equipment checked Spinal Block Patient position: sitting Prep: Betadine Patient monitoring: heart rate, continuous pulse ox and blood pressure Approach: right paramedian Location: L2-3 Injection technique: single-shot Needle Needle type: Spinocan  Needle gauge: 22 G Needle length: 9 cm Assessment Sensory level: T6 Additional Notes Expiration date of kit checked and confirmed. Patient tolerated procedure well, without complications.

## 2013-04-17 NOTE — Transfer of Care (Signed)
Immediate Anesthesia Transfer of Care Note  Patient: Shelly Ross  Procedure(s) Performed: Procedure(s) (LRB): RIGHT TOTAL KNEE ARTHROPLASTY, CORTISONE INJECTION LEFT KNEE (Right)  Patient Location: PACU  Anesthesia Type: spinal  Level of Consciousness: awake, patient cooperative and responds to stimulation  Airway & Oxygen Therapy: Patient Spontanous Breathing and Patient connected to face mask oxgen  Post-op Assessment: Report given to PACU RN and Post -op Vital signs reviewed and stable  Post vital signs: Reviewed and stable  Complications: No apparent anesthesia complications

## 2013-04-17 NOTE — Progress Notes (Signed)
Dr. Leta Jungling made aware of patient's condition- O.K. To go to Telemetry.

## 2013-04-17 NOTE — Progress Notes (Signed)
Dr. Leta Jungling in- made aware  Of patient's heart rate being as low as 38 at times-Telemetry ordered.

## 2013-04-17 NOTE — Anesthesia Postprocedure Evaluation (Signed)
  Anesthesia Post-op Note  Patient: Shelly Ross  Procedure(s) Performed: Procedure(s) (LRB): RIGHT TOTAL KNEE ARTHROPLASTY, CORTISONE INJECTION LEFT KNEE (Right)  Patient Location: PACU  Anesthesia Type: Spinal  Level of Consciousness: awake and alert   Airway and Oxygen Therapy: Patient Spontanous Breathing  Post-op Pain: mild  Post-op Assessment: Post-op Vital signs reviewed, Patient's Cardiovascular Status Stable, Respiratory Function Stable, Patent Airway and No signs of Nausea or vomiting  Last Vitals:  Filed Vitals:   04/17/13 1015  BP:   Pulse:   Temp: 36.3 C  Resp:     Post-op Vital Signs: stable   Complications: No apparent anesthesia complications

## 2013-04-17 NOTE — Preoperative (Signed)
Beta Blockers   Reason not to administer Beta Blockers:Not Applicable 

## 2013-04-17 NOTE — Progress Notes (Signed)
Dr. Lequita Halt in- made aware of patient's heart rates  And Telelmetry order.

## 2013-04-17 NOTE — Consult Note (Signed)
HPI: 79 year old female status post knee replacement for evaluation of bradycardia. Patient has previously been followed by Dr. Ladona Ridgel. She had SVT in the past and underwent ablation in 2005. Echocardiogram in 2005 showed normal LV function and mild left atrial enlargement. She also has seen Dr Ladona Ridgel for palpitations. She does have somewhat limited mobility at home because of knee arthralgias. However she typically does not have dyspnea on exertion, orthopnea, PND, chest pain or syncope. She has had occasional unexplained dizzy spells that last for several minutes. Occasional mild pedal edema. She had right total knee arthroplasty today. Following the procedure she was noted to be bradycardic with heart rates in the 30s. I do not have the strips available. However she is asymptomatic with no complaints of chest pain, dyspnea or syncope. Cardiology is asked to evaluate.  Medications Prior to Admission  Medication Sig Dispense Refill  . hydrochlorothiazide (HYDRODIURIL) 25 MG tablet Take 12.5 mg by mouth daily as needed (Only if BP BOTTOM NUMBER IS  over 85).       Marland Kitchen levothyroxine (SYNTHROID, LEVOTHROID) 75 MCG tablet Take 37.5 mcg by mouth daily before breakfast.       . Wheat Dextrin (BENEFIBER DRINK MIX PO) Take 1-2 Packages by mouth daily.      Marland Kitchen acidophilus (RISAQUAD) CAPS capsule Take 1 capsule by mouth daily.        Allergies  Allergen Reactions  . Percocet [Oxycodone-Acetaminophen] Nausea And Vomiting  . Lansoprazole Other (See Comments)    Induced nausea and possibly headaches    Past Medical History  Diagnosis Date  . Chronic low back pain   . Dizziness     some  . Hypertension   . Neuralgia, neuritis, and radiculitis, unspecified   . Degenerative disc disease     CERVICAL SPINE,W/RADICULOPATHY  . GERD (gastroesophageal reflux disease)   . Hyperlipidemia   . Thyroid disease     HYPOTHYROIDISM  . Osteoporosis   . Rib fractures   . Stricture and stenosis of esophagus  2007  . Adenomatous colon polyp 2003  . Premature ventricular contractions 2006  . Hypothyroidism   . Headache(784.0)     not migraines, but a lot of headaches  . Hepatitis     jaundice as child  . SVT (supraventricular tachycardia)     S/P ablation 2005    Past Surgical History  Procedure Laterality Date  . Abdominal hysterectomy      WITH OOPHORECTOMY,? BILATERAL FOR ENDOMETRIOSIS  . Breast enhancement surgery    . G2 p2    . Bladder tacking    . Endovenous ablation saphenous vein w/ laser  07-13-2011    right greater saphenous vein, Dr Jerilee Field  . Endovenous ablation saphenous vein w/ laser  08/2011    L ; Dr Hart [place]  . Cataract extraction Bilateral   . Esophageal dilation       X 1  . Colonoscopy    . Cardiac electrophysiology mapping and ablation      History   Social History  . Marital Status: Widowed    Spouse Name: N/A    Number of Children: 2  . Years of Education: 12th   Occupational History  . Retired     [employer]   Social History Main Topics  . Smoking status: Never Smoker   . Smokeless tobacco: Never Used  . Alcohol Use: Yes     Comment: wine, seldom   . Drug Use:  No  . Sexual Activity: Not on file   Other Topics Concern  . Not on file   Social History Narrative      Pt lives in assisted living.    Caffeine Use- Very little.    Family History  Problem Relation Age of Onset  . Stroke Mother 49  . Heart attack Father     in 39s  . Deep vein thrombosis Brother     AND PTE  . Diabetes Brother   . Cancer Paternal Aunt      INTESTINAL   . Alzheimer's disease Sister   . Alzheimer's disease Brother   . Tuberculosis Brother   . Aneurysm Brother     cns  . Heart Problems    . COPD Sister     smoking  . Heart attack Sister 88    ROS:  Bilateral knee arthralgias but no fevers or chills, productive cough, hemoptysis, dysphasia, odynophagia, melena, hematochezia, dysuria, hematuria, rash, seizure  activity, orthopnea, PND, claudication. Remaining systems are negative.  Physical Exam:   Blood pressure 110/50, pulse 66, temperature 97.5 F (36.4 C), temperature source Oral, resp. rate 16, SpO2 94.00%.  General:  Well developed/well nourished in NAD Skin warm/dry Patient not depressed No peripheral clubbing Back-normal HEENT-normal/normal eyelids Neck supple/normal carotid upstroke bilaterally; no bruits; no JVD; no thyromegaly chest - CTA/ normal expansion CV - distant heart sounds, RRR/normal S1 and S2; no murmurs, rubs or gallops;  PMI nondisplaced Abdomen -NT/ND, no HSM, no mass, + bowel sounds, no bruit 2+ femoral pulses, no bruits Ext-right lower extremity in brace status post right total knee arthroplasty. Neuro-grossly nonfocal  ECG sinus rhythm, first degree AV block, cannot rule out prior septal infarct.  Results for orders placed during the hospital encounter of 04/17/13 (from the past 48 hour(s))  CBC     Status: Abnormal   Collection Time    04/17/13 11:20 AM      Result Value Range   WBC 11.8 (*) 4.0 - 10.5 K/uL   RBC 4.17  3.87 - 5.11 MIL/uL   Hemoglobin 12.7  12.0 - 15.0 g/dL   HCT 40.9  81.1 - 91.4 %   MCV 91.1  78.0 - 100.0 fL   MCH 30.5  26.0 - 34.0 pg   MCHC 33.4  30.0 - 36.0 g/dL   RDW 78.2  95.6 - 21.3 %   Platelets 228  150 - 400 K/uL   Assessment/Plan 1 bradycardia-the patient apparently was noted to have heart rates in the high 30s in the PACU. I do not have the strips available. Electrocardiogram shows sinus rhythm, first degree AV block with no ST changes. It is unchanged compared to preoperative electrocardiogram. She is not having symptoms of chest pain, dizziness or dyspnea. I agree with following on telemetry. At present there is no indication for pacemaker. Note she has had occasional brief dizzy spells at home. Etiology unclear. Following discharge it would be worthwhile to have an event monitor placed to make sure these are not bradycardia  mediated.  2 status post right total knee arthroplasty-management per orthopedic surgery. 3 hypertension-would continue preadmission medications. Avoid AV nodal blocking agents.  Olga Millers MD 04/17/2013, 11:57 AM

## 2013-04-17 NOTE — Progress Notes (Signed)
Patient HR dropped into the 30s as low as 36 at one point. HR was non-sustained. Patient was experiencing no CP or SOB, but some slight dizziness. It is also noted in patient's chart that patient did have dizzy spells prior to admission. Patient had also had IV dilaudid just prior to her bradycardic events. Called PA on call for ortho who referred me to cardiology who had consulted with her earlier. Cardiologist on call said to monitor patient for now and to call back if patient becomes symptomatic or if low HR sustains. Patient is resting quietly now and HR is in the 50s-60s. Will continue to monitor patient.

## 2013-04-17 NOTE — Progress Notes (Signed)
Left knee bandaide intact with small amount of old bloody drainage on it.

## 2013-04-17 NOTE — Op Note (Signed)
Pre-operative diagnosis- Osteoarthritis  Bilateral knee(s)  Post-operative diagnosis- Osteoarthritis Bilateral knee(s)  Procedure-  Right  Total Knee Arthroplasty   Left knee cortisone injection  Surgeon- Gus Rankin. Shahram Alexopoulos, MD  Assistant- Dimitri Ped, PA-C   Anesthesia-  Spinal EBL-* No blood loss amount entered *  Drains Hemovac  Tourniquet time-  Total Tourniquet Time Documented: Thigh (Right) - 32 minutes Total: Thigh (Right) - 32 minutes    Complications- None  Condition-PACU - hemodynamically stable.   Brief Clinical Note  Shelly Ross is a 79 y.o. year old female with end stage OA of her right knee with progressively worsening pain and dysfunction. She has constant pain, with activity and at rest and significant functional deficits with difficulties even with ADLs. She has had extensive non-op management including analgesics, injections of cortisone, and home exercise program, but remains in significant pain with significant dysfunction.Radiographs show bone on bone arthritis all 3 compartments. She presents now for right Total Knee Arthroplasty. She also has significant arthritis left knee and is requesting a cortisone injection.  Procedure in detail---   The patient is brought into the operating room and positioned supine on the operating table. After successful administration of  Spinal,   a tourniquet is placed high on the  Right thigh(s) and the lower extremity is prepped and draped in the usual sterile fashion. Time out is performed by the operating team and then the  Right lower extremity is wrapped in Esmarch, knee flexed and the tourniquet inflated to 300 mmHg.       A midline incision is made with a ten blade through the subcutaneous tissue to the level of the extensor mechanism. A fresh blade is used to make a medial parapatellar arthrotomy. Soft tissue over the proximal medial tibia is subperiosteally elevated to the joint line with a knife and into the  semimembranosus bursa with a Cobb elevator. Soft tissue over the proximal lateral tibia is elevated with attention being paid to avoiding the patellar tendon on the tibial tubercle. The patella is everted, knee flexed 90 degrees and the ACL and PCL are removed. Findings are bone on bone all 3 compartments with large global osteophytes.        The drill is used to create a starting hole in the distal femur and the canal is thoroughly irrigated with sterile saline to remove the fatty contents. The 5 degree Right  valgus alignment guide is placed into the femoral canal and the distal femoral cutting block is pinned to remove 10 mm off the distal femur. Resection is made with an oscillating saw.      The tibia is subluxed forward and the menisci are removed. The extramedullary alignment guide is placed referencing proximally at the medial aspect of the tibial tubercle and distally along the second metatarsal axis and tibial crest. The block is pinned to remove 2mm off the more deficient medial  side. Resection is made with an oscillating saw. Size 2.5is the most appropriate size for the tibia and the proximal tibia is prepared with the modular drill and keel punch for that size.      The femoral sizing guide is placed and size 3 is most appropriate. Rotation is marked off the epicondylar axis and confirmed by creating a rectangular flexion gap at 90 degrees. The size 3 cutting block is pinned in this rotation and the anterior, posterior and chamfer cuts are made with the oscillating saw. The intercondylar block is then placed and that cut is made.  Trial size 2.5 tibial component, trial size 3 posterior stabilized femur and a 10  mm posterior stabilized rotating platform insert trial is placed. Full extension is achieved with excellent varus/valgus and anterior/posterior balance throughout full range of motion. The patella is everted and thickness measured to be 22  mm. Free hand resection is taken to 12 mm, a  35 template is placed, lug holes are drilled, trial patella is placed, and it tracks normally. Osteophytes are removed off the posterior femur with the trial in place. All trials are removed and the cut bone surfaces prepared with pulsatile lavage. Cement is mixed and once ready for implantation, the size 2.5 tibial implant, size  3 posterior stabilized femoral component, and the size 35 patella are cemented in place and the patella is held with the clamp. The trial insert is placed and the knee held in full extension. The Exparel (20 ml mixed with 30 ml saline) and .25% Bupivicaine, are injected into the extensor mechanism, posterior capsule, medial and lateral gutters and subcutaneous tissues.  All extruded cement is removed and once the cement is hard the permanent 10 mm posterior stabilized rotating platform insert is placed into the tibial tray.      The wound is copiously irrigated with saline solution and the extensor mechanism closed over a hemovac drain with #1 PDS suture. The tourniquet is released for a total tourniquet time of 31  minutes. Flexion against gravity is 140 degrees and the patella tracks normally. Subcutaneous tissue is closed with 2.0 vicryl and subcuticular with running 4.0 Monocryl. The incision is cleaned and dried and steri-strips and a bulky sterile dressing are applied. The limb is placed into a knee immobilizer. I then prepped the left knee with Betadine and injected the joint under sterile conditions with 80 mg (2 ml) of Depomedrol. The patient is then awakened and transported to recovery in stable condition.      Please note that a surgical assistant was a medical necessity for this procedure in order to perform it in a safe and expeditious manner. Surgical assistant was necessary to retract the ligaments and vital neurovascular structures to prevent injury to them and also necessary for proper positioning of the limb to allow for anatomic placement of the prosthesis.   Gus Rankin Hance Caspers, MD    04/17/2013, 8:24 AM

## 2013-04-17 NOTE — Progress Notes (Signed)
Small amount of old bloody drainage on left knee bandaide noted.

## 2013-04-18 ENCOUNTER — Telehealth: Payer: Self-pay | Admitting: Internal Medicine

## 2013-04-18 ENCOUNTER — Inpatient Hospital Stay (HOSPITAL_COMMUNITY): Payer: Medicare Other

## 2013-04-18 ENCOUNTER — Other Ambulatory Visit: Payer: Self-pay

## 2013-04-18 ENCOUNTER — Encounter (HOSPITAL_COMMUNITY): Payer: Self-pay

## 2013-04-18 DIAGNOSIS — I369 Nonrheumatic tricuspid valve disorder, unspecified: Secondary | ICD-10-CM

## 2013-04-18 DIAGNOSIS — R079 Chest pain, unspecified: Secondary | ICD-10-CM | POA: Diagnosis not present

## 2013-04-18 DIAGNOSIS — I441 Atrioventricular block, second degree: Secondary | ICD-10-CM | POA: Diagnosis not present

## 2013-04-18 LAB — BASIC METABOLIC PANEL
BUN: 12 mg/dL (ref 6–23)
Calcium: 9 mg/dL (ref 8.4–10.5)
Chloride: 104 mEq/L (ref 96–112)
Creatinine, Ser: 0.62 mg/dL (ref 0.50–1.10)
GFR calc non Af Amer: 79 mL/min — ABNORMAL LOW (ref >90)
Glucose, Bld: 156 mg/dL — ABNORMAL HIGH (ref 70–99)

## 2013-04-18 LAB — CBC
HCT: 33.4 % — ABNORMAL LOW (ref 36.0–46.0)
Hemoglobin: 11.1 g/dL — ABNORMAL LOW (ref 12.0–15.0)
MCH: 30.7 pg (ref 26.0–34.0)
MCHC: 33.2 g/dL (ref 30.0–36.0)
RDW: 12.8 % (ref 11.5–15.5)
WBC: 10.7 10*3/uL — ABNORMAL HIGH (ref 4.0–10.5)

## 2013-04-18 MED ORDER — IOHEXOL 350 MG/ML SOLN
100.0000 mL | Freq: Once | INTRAVENOUS | Status: AC | PRN
  Administered 2013-04-18: 07:00:00 100 mL via INTRAVENOUS

## 2013-04-18 MED ORDER — ALUM & MAG HYDROXIDE-SIMETH 200-200-20 MG/5ML PO SUSP
30.0000 mL | Freq: Four times a day (QID) | ORAL | Status: DC | PRN
  Administered 2013-04-20: 30 mL via ORAL
  Filled 2013-04-18: qty 30

## 2013-04-18 MED ORDER — CHLORHEXIDINE GLUCONATE 4 % EX LIQD
60.0000 mL | Freq: Once | CUTANEOUS | Status: AC
  Administered 2013-04-18: 4 via TOPICAL

## 2013-04-18 MED ORDER — FAMOTIDINE 20 MG PO TABS
20.0000 mg | ORAL_TABLET | Freq: Every day | ORAL | Status: DC
  Administered 2013-04-18 – 2013-04-22 (×5): 20 mg via ORAL
  Filled 2013-04-18 (×6): qty 1

## 2013-04-18 MED ORDER — SODIUM CHLORIDE 0.9 % IV SOLN
INTRAVENOUS | Status: DC
  Administered 2013-04-19: 05:00:00 via INTRAVENOUS

## 2013-04-18 MED ORDER — CEFAZOLIN SODIUM-DEXTROSE 2-3 GM-% IV SOLR
2.0000 g | INTRAVENOUS | Status: DC
  Filled 2013-04-18 (×2): qty 50

## 2013-04-18 MED ORDER — BIOTENE DRY MOUTH MT LIQD
15.0000 mL | Freq: Two times a day (BID) | OROMUCOSAL | Status: DC
  Administered 2013-04-18 – 2013-04-22 (×7): 15 mL via OROMUCOSAL

## 2013-04-18 MED ORDER — SODIUM CHLORIDE 0.9 % IR SOLN
80.0000 mg | Status: DC
  Filled 2013-04-18 (×2): qty 2

## 2013-04-18 MED ORDER — CHLORHEXIDINE GLUCONATE 4 % EX LIQD
60.0000 mL | Freq: Once | CUTANEOUS | Status: AC
  Administered 2013-04-19: 4 via TOPICAL

## 2013-04-18 NOTE — Progress Notes (Signed)
CARE MANAGEMENT NOTE 04/18/2013  Patient:  TERRIANNA, HOLSCLAW   Account Number:  [id number]  Date Initiated:  04/18/2013  Documentation initiated by:  Gregg Holster  Subjective/Objective Assessment:   pt s/p tkr, complete heart block with severe bradycardia post op. transferred to sdu for monitoring, source of brady-organic versus drug??     Action/Plan:   tbd home with hhc versus rehab, plan is including insertion of pacemaker   Anticipated DC Date:  04/21/2013   Anticipated DC Plan:  HOME W HOME HEALTH SERVICES  In-house referral  NA      DC Planning Services  NA      [facility] Choice  NA   Choice offered to / List presented to:  NA   DME arranged  NA      DME agency  NA     HH arranged  NA      HH agency  NA   Status of service:  In process, will continue to follow Medicare Important Message given?  NA - LOS <3 / Initial given by admissions (If response is "NO", the following Medicare IM given date fields will be blank) Date Medicare IM given:   Date Additional Medicare IM given:    Discharge Disposition:    Per UR Regulation:  Reviewed for med. necessity/level of care/duration of stay  If discussed at Long Length of Stay Meetings, dates discussed:    Comments:  [id number]/Davonte Siebenaler Stark Jock, BSN, [place] [phone] Chart Reviewed for discharge and hospital needs. Discharge needs at time of review:  None present will follow for needs. Review of patient progress due on [id number].

## 2013-04-18 NOTE — Progress Notes (Signed)
Patient's HR has continued to drop into the 30s-40s and sustained  for 10 minutes. Called cardiologist on call, Dr. Mayford Knife, who reviewed patient's previous EKGs and said she saw a 2nd degree type 1 block. Patient's other vitals remain stable. Patient's chest pain still persists, but is slightly decreased after dilaudid given. Dr. Mayford Knife decided to transfer patient to stepdown for closer monitoring.

## 2013-04-18 NOTE — Progress Notes (Addendum)
     SUBJECTIVE: c/o weakness, dizziness, overall feels poorly. Ongoing mild chest discomfort.   BP 108/45  Pulse 66  Temp(Src) 97.7 F (36.5 C) (Oral)  Resp 16  Ht 5' 2.5" (1.588 m)  Wt 127 lb (57.607 kg)  BMI 22.84 kg/m2  SpO2 98%  Intake/Output Summary (Last 24 hours) at 04/18/13 0853 Last data filed at 04/18/13 0800  Gross per 24 hour  Intake 2423.75 ml  Output   1673 ml  Net 750.75 ml    PHYSICAL EXAM General: Well developed, well nourished, in no acute distress. Alert and oriented x 3.  Psych:  Good affect, responds appropriately Neck: No JVD. No masses noted.  Lungs: Clear bilaterally with no wheezes or rhonci noted.  Heart: Brady, irregular with no murmurs noted. Abdomen: Bowel sounds are present. Soft, non-tender.  Extremities: No lower extremity edema.   LABS: Basic Metabolic Panel:  Recent Labs  16/10/96 1120 04/18/13 0350  NA 140 137  K 3.2* 4.0  CL 108 104  CO2 25 27  GLUCOSE 155* 156*  BUN 14 12  CREATININE 0.56 0.62  CALCIUM 9.1 9.0  MG 1.8  --    CBC:  Recent Labs  04/17/13 1120 04/18/13 0350  WBC 11.8* 10.7*  HGB 12.7 11.1*  HCT 38.0 33.4*  MCV 91.1 92.3  PLT 228 224   Cardiac Enzymes:  Recent Labs  04/17/13 1120 04/18/13 0350  CKTOTAL  --  75  CKMB  --  3.0  TROPONINI <0.30 <0.30   Current Meds: . acetaminophen  1,000 mg Oral Q6H  . dexamethasone  10 mg Oral Daily   Or  . dexamethasone  10 mg Intravenous Daily  . docusate sodium  100 mg Oral BID  . levothyroxine  37.5 mcg Oral QAC breakfast  . rivaroxaban  10 mg Oral Q breakfast   ASSESSMENT AND PLAN:  1. Bradycardia/AV block: Sinus bradycardia on telemetry with high grade AV block. This appears to be second degree AV block (Mobitz 2) at times but cannot say for sure it is not Mobitz 1.  Regardless, she is symptomatic with the bradycardia with  dizziness, weakness. Pt transferred to ICU for close monitoring. Avoid AV nodal blocking agents today. Will ask EP to see to  evaluate for pacemaker. She is hemodynamically stable at this time. Echo today to assess LV function.   2. S/p right total knee arthroplasty: Management per orthopedic surgery.   3. Chest pain: No known CAD. Cardiac markers negative. CTA chest 04/18/13 with no evidence of PE. Will start Pepcid (prior reaction to PPI, unknown) and use Maalox prn.      MCALHANY,CHRISTOPHER  11/18/20148:53 AM

## 2013-04-18 NOTE — Progress Notes (Signed)
Subjective: 1 Day Post-Op Procedure(s) (LRB): RIGHT TOTAL KNEE ARTHROPLASTY, CORTISONE INJECTION LEFT KNEE (Right) Patient had a rough night on the evening of surgery.  Found to have bradycardia psotop in PACU.  Cardiology consult was called.  Seen in consultation by Dr. Jens Som. Assessment/Plan  1 bradycardia-the patient apparently was noted to have heart rates in the high 30s in the PACU. I do not have the strips available. Electrocardiogram shows sinus rhythm, first degree AV block with no ST changes. It is unchanged compared to preoperative electrocardiogram. She is not having symptoms of chest pain, dizziness or dyspnea. I agree with following on telemetry. At present there is no indication for pacemaker. Note she has had occasional brief dizzy spells at home. Etiology unclear. Following discharge it would be worthwhile to have an event monitor placed to make sure these are not bradycardia mediated.  2 status post right total knee arthroplasty-management per orthopedic surgery.  3 hypertension-would continue preadmission medications. Avoid AV nodal blocking agents.  Transferred to stepdown last night for closer monitoring. Elevated D-Dimer and then CT Angio which was negative for PE by report.  RN states rough night and no rest. Patient seen in rounds with Dr. Lequita Halt.  She is sitting up in bed.  She states to Dr. Lequita Halt that she has had episodes of weakness that typically resolved with rest.  May have had some ongoing episodes of bradycardia for a little while.  Cardiology already mentioned event monitor.  On tele now. Patient is having problems with bradycardia, heartburn, and fatigue. We will start therapy today but only bed exercises until improves. Plan is to go Skilled nursing facility after hospital stay.  Objective: Vital signs in last 24 hours: Temp:  [96.6 F (35.9 C)-97.9 F (36.6 C)] 97.9 F (36.6 C) (11/18 0658) Pulse Rate:  [52-85] 67 (11/18 0700) Resp:  [11-26] 15  (11/18 0700) BP: (98-140)/(43-82) 110/43 mmHg (11/18 0700) SpO2:  [94 %-100 %] 96 % (11/18 0700) Weight:  [57.607 kg (127 lb)] 57.607 kg (127 lb) (11/17 1120)  Intake/Output from previous day:  Intake/Output Summary (Last 24 hours) at 04/18/13 0756 Last data filed at 04/18/13 0700  Gross per 24 hour  Intake 2888.75 ml  Output   1723 ml  Net 1165.75 ml    Intake/Output this shift:    Labs:  Recent Labs  04/17/13 1120 04/18/13 0350  HGB 12.7 11.1*    Recent Labs  04/17/13 1120 04/18/13 0350  WBC 11.8* 10.7*  RBC 4.17 3.62*  HCT 38.0 33.4*  PLT 228 224    Recent Labs  04/17/13 1120 04/18/13 0350  NA 140 137  K 3.2* 4.0  CL 108 104  CO2 25 27  BUN 14 12  CREATININE 0.56 0.62  GLUCOSE 155* 156*  CALCIUM 9.1 9.0   No results found for this basename: LABPT, INR,  in the last 72 hours  EXAM General - Patient is Alert and Appropriate Extremity - Neurovascular intact Sensation intact distally Dorsiflexion/Plantar flexion intact Dressing - dressing C/D/I Motor Function - intact, moving foot and toes well on exam.  Hemovac pulled without difficulty.  Past Medical History  Diagnosis Date  . Chronic low back pain   . Dizziness     some  . Hypertension   . Neuralgia, neuritis, and radiculitis, unspecified   . Degenerative disc disease     CERVICAL SPINE,W/RADICULOPATHY  . GERD (gastroesophageal reflux disease)   . Hyperlipidemia   . Thyroid disease     HYPOTHYROIDISM  .  Osteoporosis   . Rib fractures   . Stricture and stenosis of esophagus 2007  . Adenomatous colon polyp 2003  . Premature ventricular contractions 2006  . Hypothyroidism   . Headache(784.0)     not migraines, but a lot of headaches  . Hepatitis     jaundice as child  . SVT (supraventricular tachycardia)     S/P ablation 2005    Assessment/Plan: 1 Day Post-Op Procedure(s) (LRB): RIGHT TOTAL KNEE ARTHROPLASTY, CORTISONE INJECTION LEFT KNEE (Right) Principal Problem:   OA  (osteoarthritis) of knee Active Problems:   Bradycardia  Estimated body mass index is 22.84 kg/(m^2) as calculated from the following:   Height as of this encounter: 5' 2.5" (1.588 m).   Weight as of this encounter: 57.607 kg (127 lb).  Will keep at bedrest but allow bed exercises until Cardiology feels that she is safe from a heart standpoint to get up and about. Plan is for [facility] for furthe care following the hospital stay.  Social worker to assist with placement.  DVT Prophylaxis - Xarelto Weight-Bearing as tolerated to right leg Stepdown for close monitoring Greatly appreciate Cardiology assistance with this nice lady.  PERKINS, ALEXZANDREW 04/18/2013, 7:56 AM

## 2013-04-18 NOTE — Progress Notes (Signed)
Noticed on patient's telemetry strip from earlier in the evening that patient had been in 2nd degree HB Type II. Patient had also been in 2nd degree HB Type II as early as 12:42 pm according to previous saved strips. Had not received this info from CMT or from day shift RN. Patient continues to brady down to the 30s-40s at times. Did EKG on patient and printed out a few different strips. 1st EKG said 2nd degree AV block type I and another EKG said 1st degree HB. While we were doing EKG, patient also informed me that she was having left sided chest and back pain. Cardiologist on call, Dr. Mayford Knife, paged. Stat D-Dimer, CKMB, and Troponin ordered on patient. Dr. Mayford Knife said to continue to watch the heart rate and rhythm and to call her back if anything changes. Other vitals are stable. Will continue to monitor patient.

## 2013-04-18 NOTE — Progress Notes (Signed)
Clinical Social Work Department BRIEF PSYCHOSOCIAL ASSESSMENT 04/18/2013  Patient:  Shelly Ross, Shelly Ross     Account Number:  [id number]     Admit date:  04/17/2013  Clinical Social Worker:  Jacelyn Grip  Date/Time:  04/18/2013 02:30 PM  Referred by:  Physician  Date Referred:  04/18/2013 Referred for  SNF Placement   Other Referral:   Interview type:  Patient Other interview type:   and patient daughter at bedside    PSYCHOSOCIAL DATA Living Status:  FACILITY Admitted from facility:  [facility] Level of care:  Independent Living Primary support name:  Malachi Bonds Campbell/[phone] Primary support relationship to patient:  CHILD, ADULT Degree of support available:   strong    CURRENT CONCERNS Current Concerns  Post-Acute Placement   Other Concerns:    SOCIAL WORK ASSESSMENT / PLAN CSW received New SNF referral.    CSW met with pt and pt daughter at bedside. CSW introduced self and explained role. Pt and pt daughter discussed that pt had surgery yesterday and is now scheduled for a pacemaker tomorrow secondary to heart block with severe bradycardia post op. Pt and pt daughter discussed that pt is a resident at [facility] and [facility] and plan is for pt to go to skilled level of care at [facility] and [facility] upon discharge.    Pt was concerned about [facility] being updated on anticipated d/c date and CSW reassured pt that CSW would update [facility] on a daily basis.    CSW spoke with [facility] and updated facility re: patient.    CSW to continue to follow and facilitate pt discharge needs when pt medically ready for discharge.   Assessment/plan status:  Psychosocial Support/Ongoing Assessment of Needs Other assessment/ plan:   discharge planning   Information/referral to community resources:   Referral to [facility] and [facility].     PATIENT'S/FAMILY'S RESPONSE TO PLAN OF CARE: Pt alert and oriented x 3. Pt daughter appears supportive and actively involved in pt care. Pt and pt daughter appreciative of CSW support and assistance.    Jacklynn Lewis, MSW, LCSWA  Clinical Social Work [phone]

## 2013-04-18 NOTE — Consult Note (Signed)
ELECTROPHYSIOLOGY CONSULT NOTE    Patient ID: Shelly Ross MRN: [id number], DOB/AGE: [birth year] 77 y.o.  Admit date: 04/17/2013 Date of Consult: 04-18-2013  Primary Physician: Marga Melnick, MD Primary Cardiologist: Lewayne Bunting (previously)  Reason for Consultation: heart block  HPI:  Shelly Ross is a 79 year old female with a past medical history significant for hypertension, hyperlipidemia, hypothyroidism, AVNRT (s/p slow pathway modification 2005), and chronic dizziness.  She was admitted yesterday and underwent right total knee arthroscopy and left knee cortisone injection.  In the PACU, she was noted to have periods of bradycardia and has been monitored on telemetry.  Telemetry has demonstrated intermittent complete heart block with rates into the 30's and a wide ventricular escape at times.    Home medications include HCTZ but no AV nodal blocking agents.  She has had a longstanding history of dizziness (dating back to at least 2009).    EP has been asked to evaluate for treatment options.   Past Medical History  Diagnosis Date  . Chronic low back pain   . Dizziness     some  . Hypertension   . Neuralgia, neuritis, and radiculitis, unspecified   . Degenerative disc disease     CERVICAL SPINE,W/RADICULOPATHY  . GERD (gastroesophageal reflux disease)   . Hyperlipidemia   . Thyroid disease     HYPOTHYROIDISM  . Osteoporosis   . Rib fractures   . Stricture and stenosis of esophagus 2007  . Adenomatous colon polyp 2003  . Premature ventricular contractions 2006  . Hypothyroidism   . Headache(784.0)     not migraines, but a lot of headaches  . Hepatitis     jaundice as child  . SVT (supraventricular tachycardia)     S/P ablation 2005     Surgical History:  Past Surgical History  Procedure Laterality Date  . Abdominal hysterectomy      WITH OOPHORECTOMY,? BILATERAL FOR ENDOMETRIOSIS  . Breast enhancement surgery    . G2 p2    . Bladder tacking    .  Endovenous ablation saphenous vein w/ laser  07-13-2011    right greater saphenous vein, Dr Jerilee Field  . Endovenous ablation saphenous vein w/ laser  08/2011    L ; Dr Hart [place]  . Cataract extraction Bilateral   . Esophageal dilation       X 1  . Colonoscopy    . Cardiac electrophysiology mapping and ablation       Prescriptions prior to admission  Medication Sig Dispense Refill  . hydrochlorothiazide (HYDRODIURIL) 25 MG tablet Take 12.5 mg by mouth daily as needed (Only if BP BOTTOM NUMBER IS  over 85).       Marland Kitchen levothyroxine (SYNTHROID, LEVOTHROID) 75 MCG tablet Take 37.5 mcg by mouth daily before breakfast.       . Wheat Dextrin (BENEFIBER DRINK MIX PO) Take 1-2 Packages by mouth daily.      Marland Kitchen acidophilus (RISAQUAD) CAPS capsule Take 1 capsule by mouth daily.        Inpatient Medications:  . acetaminophen  1,000 mg Oral Q6H  . dexamethasone  10 mg Oral Daily   Or  . dexamethasone  10 mg Intravenous Daily  . docusate sodium  100 mg Oral BID  . famotidine  20 mg Oral Daily  . levothyroxine  37.5 mcg Oral QAC breakfast  . rivaroxaban  10 mg Oral Q breakfast    Allergies:  Allergies  Allergen Reactions  . Percocet [Oxycodone-Acetaminophen] Nausea And Vomiting  .  Lansoprazole Other (See Comments)    Induced nausea and possibly headaches    History   Social History  . Marital Status: Widowed    Spouse Name: N/A    Number of Children: 2  . Years of Education: 12th   Occupational History  . Retired     [employer]   Social History Main Topics  . Smoking status: Never Smoker   . Smokeless tobacco: Never Used  . Alcohol Use: Yes     Comment: wine, seldom   . Drug Use: No  . Sexual Activity: Not on file   Other Topics Concern  . Not on file   Social History Narrative      Pt lives in assisted living.    Caffeine Use- Very little.     Family History  Problem Relation Age of Onset  . Stroke Mother 53  . Heart attack Father      in 60s  . Deep vein thrombosis Brother     AND PTE  . Diabetes Brother   . Cancer Paternal Aunt      INTESTINAL   . Alzheimer's disease Sister   . Alzheimer's disease Brother   . Tuberculosis Brother   . Aneurysm Brother     cns  . Heart Problems    . COPD Sister     smoking  . Heart attack Sister 46    Physical Exam Well appearing elderly woman, NAD HEENT: Unremarkable Neck:  No JVD, no thyromegally Back:  No CVA tenderness Lungs:  Clear with no wheezes HEART:  Regular rate rhythm, no murmurs, no rubs, no clicks Abd:  soft, positive bowel sounds, no organomegally, no rebound, no guarding Ext:  2 plus pulses, no edema, no cyanosis, no clubbing, right knee bandaged Skin:  No rashes no nodules Neuro:  CN II through XII intact, motor grossly intact   Labs:   Lab Results  Component Value Date   WBC 10.7* 04/18/2013   HGB 11.1* 04/18/2013   HCT 33.4* 04/18/2013   MCV 92.3 04/18/2013   PLT 224 04/18/2013    Recent Labs Lab 04/18/13 0350  NA 137  K 4.0  CL 104  CO2 27  BUN 12  CREATININE 0.62  CALCIUM 9.0  GLUCOSE 156*   Lab Results  Component Value Date   CKTOTAL 75 04/18/2013   CKMB 3.0 04/18/2013   TROPONINI <0.30 04/18/2013   Lab Results  Component Value Date   CHOL 170 12/26/2012   CHOL 167 04/27/2012   CHOL 157 07/24/2010   Lab Results  Component Value Date   HDL 70.00 12/26/2012   HDL 21.30 04/27/2012   HDL 58.90 07/24/2010   Lab Results  Component Value Date   LDLCALC 92 12/26/2012   LDLCALC 87 04/27/2012   LDLCALC 92 07/24/2010   Lab Results  Component Value Date   TRIG 42.0 12/26/2012   TRIG 40.0 04/27/2012   TRIG 29.0 07/24/2010   Lab Results  Component Value Date   CHOLHDL 2 12/26/2012   CHOLHDL 2 04/27/2012   CHOLHDL 3 07/24/2010   No results found for this basename: LDLDIRECT    Lab Results  Component Value Date   DDIMER 13.27* 04/18/2013     Radiology/Studies: Dg Chest 2 View  04/10/2013   CLINICAL DATA:  Preop for knee  replacement  EXAM: CHEST  2 VIEW  COMPARISON:  12/24/2011  FINDINGS: Cardiomediastinal silhouette is stable. No acute infiltrate or pleural effusion. No pulmonary edema. Lower  thoracic levoscoliosis. Old left upper rib fractures. Thoracic spine osteopenia.  IMPRESSION: No active disease. Lower thoracic levoscoliosis.   Electronically Signed   By: Natasha Mead M.D.   On: 04/10/2013 13:48   Ct Angio Chest Pe W/cm &/or Wo Cm  04/18/2013   CLINICAL DATA:  Evaluate pulmonary embolism. Chest pain, burning in throat. History of gastroesophageal reflux disease. History of esophageal dilatation.  EXAM: CT ANGIOGRAPHY CHEST WITH CONTRAST  TECHNIQUE: Multidetector CT imaging of the chest was performed using the standard protocol during bolus administration of intravenous contrast. Multiplanar CT image reconstructions including MIPs were obtained to evaluate the vascular anatomy.  CONTRAST:  OMNIPAQUE IOHEXOL 350 MG/ML SOLN  COMPARISON:  Chest radiograph April 10, 2013  FINDINGS: Main pulmonary artery is not enlarged, no pulmonary arterial filling defect to the level of the subsegmental branches.  Mild biapical pleural parenchymal scarring and trace bronchiectasis. Mild lung base pleural thickening, with mild bibasilar atelectasis and scarring. No solid pulmonary nodules or masses. 3 mm right upper lobe anterior segment ground-glass nodule, no surveillance recommendations.  Tracheobronchial tree is patent and midline. 5 mm pretracheal lymph node, no lymphadenopathy by CT size criteria. Subcarinal calcified lymph nodes. The heart and pericardium are unremarkable. Thoracic aorta is normal in course and caliber with mild intimal thickening calcific atherosclerosis.  Small hiatal hernia. Included view of the abdomen is unremarkable. Bilateral calcified breast implants in place. Patient is osteopenic with mild to moderate chronic T11 compression fracture.  Review of the MIP images confirms the above findings.  IMPRESSION:  No pulmonary embolism.  Mild biapical pleural parenchymal scarring, with bibasilar atelectasis and pleural thickening.   Electronically Signed   By: Awilda Metro   On: 04/18/2013 06:55    EKG: nsr with AV WB block   TELEMETRY: NSR with AVWB block and intermittant CHB  A/P 1. AVWB block and intermittant CHB with at times a wide ventricular escape 2. Chronic dizziness 3. S/p knee replacement 4. Severe arthritis Rec: While her chronic dizziness is not likely to be due to bradycardia, she had clear intermittent complete heart block with a wide ventricular escape at 4 this morning. I asked the patient and she stated that she was awake. Since then she has had 2:1 AV block. Careful review of her history also suggests that she has had episodes where she fell out without warning. She is a bit of a difficult historian however. With all of the above, I would suggest that she undergo PPM insertion. Will attempt to schedule this tomorrow. She would be ready for DC on Thursday from cardiac perspective.  Leonia Reeves.D

## 2013-04-18 NOTE — Progress Notes (Signed)
While giving report to stepdown RN, saw patient's  D-Dimer had come back elevated at 13.27. Called cardiologist on call, Dr. Mayford Knife, who ordered stat CT Angio to r/o PE. Stepdown RN came up to floor to help this Clinical research associate transport patient to CT. CT completed and patient then transferred to stepdown unit. Paged ortho on call to tell them that their patient transferred to SDU this AM.

## 2013-04-18 NOTE — Evaluation (Signed)
Physical Therapy Evaluation Patient Details Name: Shelly Ross MRN: [id number] DOB: 11-Dec-1924 Today's Date: 04/18/2013 Time: 6578-4696 PT Time Calculation (min): 24 min  PT Assessment / Plan / Recommendation History of Present Illness  Pt is a 79 year old female with a past medical history significant for hypertension, hyperlipidemia, hypothyroidism, AVNRT (s/p slow pathway modification 2005), and chronic dizziness.  She was admitted yesterday and underwent right total knee arthroscopy and left knee cortisone injection.  In the PACU, she was noted to have periods of bradycardia and has been monitored on telemetry.  Telemetry has demonstrated intermittent complete heart block with rates into the 30's and a wide ventricular escape at times.    Clinical Impression  Pt s/p R TKA. Pt currently presenting with functional limitations due to deficits listed below (see PT Problem List). Pt would benefit from skilled PT to increase independence and safety during mobility and to allow d/c to venue listed below.  PT did not mobilize pt as she has bed rest orders and is scheduled to have a PPM procedure tomorrow, only able to perform LE exercises in bed currently.  PT educated pt and pt's family on the importance of keeping RLE in extended position in order to maintain ROM and to perform ankle pumps throughout the day.  Please update activity level after PPM procedure.    PT Assessment  Patient needs continued PT services    Follow Up Recommendations  SNF;Supervision/Assistance - 24 hour    Does the patient have the potential to tolerate intense rehabilitation      Barriers to Discharge Decreased caregiver support      Equipment Recommendations  None recommended by PT    Recommendations for Other Services     Frequency 7X/week    Precautions / Restrictions Precautions Precautions: Fall Precaution Comments: pt's currently on bedrest as HR has been dropping to 30-40 bpm. Required Braces or  Orthoses: Knee Immobilizer - Right Restrictions Weight Bearing Restrictions: Yes RLE Weight Bearing: Weight bearing as tolerated   Pertinent Vitals/Pain Pt reports no pain at rest with increase in R knee pain (9/10) during exercises, RN informed and pt positioned to comfort at end of session. HR monitored during session with pt's HR briefly dropping to 38, however, quickly increasing to high 70's to low 80's.      Mobility  Bed Mobility Bed Mobility: Not assessed Details for Bed Mobility Assistance: pt on bed rest orders at this time and PT only cleared to perform therapeutic exercises with pt. Transfers Transfers: Not assessed Ambulation/Gait Ambulation/Gait Assistance: Not tested (comment)    Exercises Total Joint Exercises Ankle Circles/Pumps: AROM;20 reps;Both;Supine Quad Sets: AROM;20 reps;Supine;Right Heel Slides: AROM;Supine;Right;10 reps (pt required increased time during all exercises due to pain and fatigue.) Hip ABduction/ADduction: AROM;Supine;Right;10 reps Goniometric ROM: approx lacking 10 degrees of R knee extension and flexion limited to 35 degrees.   PT Diagnosis: Difficulty walking;Generalized weakness;Acute pain  PT Problem List: Decreased strength;Decreased range of motion;Decreased activity tolerance;Decreased mobility;Pain;Decreased knowledge of use of DME PT Treatment Interventions: DME instruction;Gait training;Functional mobility training;Therapeutic activities;Therapeutic exercise;Patient/family education     PT Goals(Current goals can be found in the care plan section) Acute Rehab PT Goals Patient Stated Goal: none specified PT Goal Formulation: With patient Time For Goal Achievement: 05/02/13 Potential to Achieve Goals: Good  Visit Information  Last PT Received On: 04/18/13 Assistance Needed: +1 History of Present Illness: Pt is a 79 year old female with a past medical history significant for hypertension, hyperlipidemia, hypothyroidism, AVNRT (s/p  slow pathway modification 2005), and chronic dizziness.  She was admitted yesterday and underwent right total knee arthroscopy and left knee cortisone injection.  In the PACU, she was noted to have periods of bradycardia and has been monitored on telemetry.  Telemetry has demonstrated intermittent complete heart block with rates into the 30's and a wide ventricular escape at times.         Prior Functioning  Home Living Family/patient expects to be discharged to:: Other (Comment) (independent living) Living Arrangements: Alone Home Equipment: [place] - 4 wheels;Cane - single point;Tub bench Prior Function Level of Independence: Independent with assistive device(s) Comments: pt reports occassionally using rollator during ambulation for long distances and was ind. in all ADLs. Communication Communication: No difficulties    Cognition  Cognition Arousal/Alertness: Awake/alert Behavior During Therapy: WFL for tasks assessed/performed Overall Cognitive Status: Within Functional Limits for tasks assessed    Extremity/Trunk Assessment Lower Extremity Assessment Lower Extremity Assessment: RLE deficits/detail RLE Deficits / Details: no reports of numbness/tingling RLE: Unable to fully assess due to pain   Balance    End of Session PT - End of Session Equipment Utilized During Treatment: Oxygen Activity Tolerance: Patient limited by fatigue;Patient limited by pain Patient left: in bed;with family/visitor present;with call bell/phone within reach Nurse Communication: Mobility status;Patient requests pain meds CPM Right Knee CPM Right Knee: On  GP     Sol Blazing 04/18/2013, 3:41 PM

## 2013-04-18 NOTE — Evaluation (Signed)
I have reviewed this note and agree with all findings. Kati Cheris Tweten, PT, DPT Pager: [phone]   

## 2013-04-18 NOTE — Progress Notes (Signed)
*  PRELIMINARY RESULTS* Echocardiogram 2D Echocardiogram has been performed.  Shelly Ross 04/18/2013, 10:39 AM

## 2013-04-19 ENCOUNTER — Encounter (HOSPITAL_COMMUNITY)
Admission: RE | Disposition: A | Discharge status: Skilled Nursing Facility | Payer: Self-pay | Source: Ambulatory Visit | Attending: Orthopedic Surgery

## 2013-04-19 DIAGNOSIS — I441 Atrioventricular block, second degree: Secondary | ICD-10-CM

## 2013-04-19 HISTORY — PX: PERMANENT PACEMAKER INSERTION: SHX5480

## 2013-04-19 LAB — CBC
HCT: 29.1 % — ABNORMAL LOW (ref 36.0–46.0)
MCH: 30.1 pg (ref 26.0–34.0)
MCHC: 32.6 g/dL (ref 30.0–36.0)
Platelets: 185 10*3/uL (ref 150–400)
RBC: 3.16 MIL/uL — ABNORMAL LOW (ref 3.87–5.11)

## 2013-04-19 LAB — BASIC METABOLIC PANEL
BUN: 8 mg/dL (ref 6–23)
CO2: 25 mEq/L (ref 19–32)
Calcium: 8.7 mg/dL (ref 8.4–10.5)
GFR calc Af Amer: >90 mL/min (ref >90)
GFR calc non Af Amer: 82 mL/min — ABNORMAL LOW (ref >90)
Sodium: 134 mEq/L — ABNORMAL LOW (ref 135–145)

## 2013-04-19 SURGERY — PERMANENT PACEMAKER INSERTION
Anesthesia: LOCAL

## 2013-04-19 MED ORDER — CEFAZOLIN SODIUM 1-5 GM-% IV SOLN
1.0000 g | Freq: Four times a day (QID) | INTRAVENOUS | Status: AC
  Administered 2013-04-19 – 2013-04-20 (×3): 1 g via INTRAVENOUS
  Filled 2013-04-19 (×3): qty 50

## 2013-04-19 MED ORDER — SODIUM CHLORIDE 0.9 % IJ SOLN: 3.0000 mL | INTRAMUSCULAR | Status: DC | PRN

## 2013-04-19 MED ORDER — ONDANSETRON HCL 4 MG/2ML IJ SOLN
4.0000 mg | Freq: Four times a day (QID) | INTRAMUSCULAR | Status: DC | PRN
Start: 2013-04-19 — End: 2013-04-22

## 2013-04-19 MED ORDER — SODIUM CHLORIDE 0.9 % IV SOLN: 250.0000 mL | INTRAVENOUS | Status: DC | PRN

## 2013-04-19 MED ORDER — LIDOCAINE HCL (PF) 1 % IJ SOLN
INTRAMUSCULAR | Status: AC
  Filled 2013-04-19: qty 60

## 2013-04-19 MED ORDER — HEPARIN (PORCINE) IN NACL 2-0.9 UNIT/ML-% IJ SOLN
INTRAMUSCULAR | Status: AC
  Filled 2013-04-19: qty 1000

## 2013-04-19 MED ORDER — ACETAMINOPHEN 325 MG PO TABS
325.0000 mg | ORAL_TABLET | ORAL | Status: DC | PRN
  Filled 2013-04-19: qty 2

## 2013-04-19 MED ORDER — SODIUM CHLORIDE 0.9 % IJ SOLN
3.0000 mL | Freq: Two times a day (BID) | INTRAMUSCULAR | Status: DC
  Administered 2013-04-19 – 2013-04-21 (×4): 3 mL via INTRAVENOUS

## 2013-04-19 NOTE — Progress Notes (Signed)
OT Cancellation Note  Patient Details Name: Ginnie Marich MRN: [id number] DOB: [birth year]   Cancelled Treatment:    Reason Eval/Treat Not Completed: Medical issues which prohibited therapy.  Noted pt is on bedrest until cleared by cardiology.  Pacemaker planned for today.  Will check back tomorrow.    Yarisbel Miranda 04/19/2013, 8:49 AM Marica Otter, OTR/L [phone] 04/19/2013

## 2013-04-19 NOTE — H&P (View-Only) (Signed)
 ELECTROPHYSIOLOGY CONSULT NOTE    Patient ID: Shelly Ross MRN: [id number], DOB/AGE: 08/18/1924 79 y.o.  Admit date: 04/17/2013 Date of Consult: 04-18-2013  Primary Physician: Shelly Hopper, MD Primary Cardiologist: Shelly Ross (previously)  Reason for Consultation: heart block  HPI:  Mrs. Shelly Ross is a 79 year old female with a past medical history significant for hypertension, hyperlipidemia, hypothyroidism, AVNRT (s/p slow pathway modification 2005), and chronic dizziness.  She was admitted yesterday and underwent right total knee arthroscopy and left knee cortisone injection.  In the PACU, she was noted to have periods of bradycardia and has been monitored on telemetry.  Telemetry has demonstrated intermittent complete heart block with rates into the 30's and a wide ventricular escape at times.    Home medications include HCTZ but no AV nodal blocking agents.  She has had a longstanding history of dizziness (dating back to at least 2009).    EP has been asked to evaluate for treatment options.   Past Medical History  Diagnosis Date  . Chronic low back pain   . Dizziness     some  . Hypertension   . Neuralgia, neuritis, and radiculitis, unspecified   . Degenerative disc disease     CERVICAL SPINE,W/RADICULOPATHY  . GERD (gastroesophageal reflux disease)   . Hyperlipidemia   . Thyroid disease     HYPOTHYROIDISM  . Osteoporosis   . Rib fractures   . Stricture and stenosis of esophagus 2007  . Adenomatous colon polyp 2003  . Premature ventricular contractions 2006  . Hypothyroidism   . Headache(784.0)     not migraines, but a lot of headaches  . Hepatitis     jaundice as child  . SVT (supraventricular tachycardia)     S/P ablation 2005     Surgical History:  Past Surgical History  Procedure Laterality Date  . Abdominal hysterectomy      WITH OOPHORECTOMY,? BILATERAL FOR ENDOMETRIOSIS  . Breast enhancement surgery    . G2 p2    . Bladder tacking    .  Endovenous ablation saphenous vein w/ laser  07-13-2011    right greater saphenous vein, Dr Shelly Ross  . Endovenous ablation saphenous vein w/ laser  08/2011    L ; Dr Ross  . Cataract extraction Bilateral   . Esophageal dilation       X 1  . Colonoscopy    . Cardiac electrophysiology mapping and ablation       Prescriptions prior to admission  Medication Sig Dispense Refill  . hydrochlorothiazide (HYDRODIURIL) 25 MG tablet Take 12.5 mg by mouth daily as needed (Only if BP BOTTOM NUMBER IS  over 85).       . levothyroxine (SYNTHROID, LEVOTHROID) 75 MCG tablet Take 37.5 mcg by mouth daily before breakfast.       . Wheat Dextrin (BENEFIBER DRINK MIX PO) Take 1-2 Packages by mouth daily.      . acidophilus (RISAQUAD) CAPS capsule Take 1 capsule by mouth daily.        Inpatient Medications:  . acetaminophen  1,000 mg Oral Q6H  . dexamethasone  10 mg Oral Daily   Or  . dexamethasone  10 mg Intravenous Daily  . docusate sodium  100 mg Oral BID  . famotidine  20 mg Oral Daily  . levothyroxine  37.5 mcg Oral QAC breakfast  . rivaroxaban  10 mg Oral Q breakfast    Allergies:  Allergies  Allergen Reactions  . Percocet [Oxycodone-Acetaminophen] Nausea And Vomiting  .   Lansoprazole Other (See Comments)    Induced nausea and possibly headaches    History   Social History  . Marital Status: Widowed    Spouse Name: N/A    Number of Children: 2  . Years of Education: 12th   Occupational History  . Retired     [employer]   Social History Main Topics  . Smoking status: Never Smoker   . Smokeless tobacco: Never Used  . Alcohol Use: Yes     Comment: wine, seldom   . Drug Use: No  . Sexual Activity: Not on file   Other Topics Concern  . Not on file   Social History Narrative      Pt lives in assisted living.    Caffeine Use- Very little.     Family History  Problem Relation Age of Onset  . Stroke Mother 80  . Heart attack Father      in 40s  . Deep vein thrombosis Brother     AND PTE  . Diabetes Brother   . Cancer Paternal Aunt      INTESTINAL   . Alzheimer's disease Sister   . Alzheimer's disease Brother   . Tuberculosis Brother   . Aneurysm Brother     cns  . Heart Problems    . COPD Sister     smoking  . Heart attack Sister 88    Physical Exam Well appearing elderly woman, NAD HEENT: Unremarkable Neck:  No JVD, no thyromegally Back:  No CVA tenderness Lungs:  Clear with no wheezes HEART:  Regular rate rhythm, no murmurs, no rubs, no clicks Abd:  soft, positive bowel sounds, no organomegally, no rebound, no guarding Ext:  2 plus pulses, no edema, no cyanosis, no clubbing, right knee bandaged Skin:  No rashes no nodules Neuro:  CN II through XII intact, motor grossly intact   Labs:   Lab Results  Component Value Date   WBC 10.7* 04/18/2013   HGB 11.1* 04/18/2013   HCT 33.4* 04/18/2013   MCV 92.3 04/18/2013   PLT 224 04/18/2013    Recent Labs Lab 04/18/13 0350  NA 137  K 4.0  CL 104  CO2 27  BUN 12  CREATININE 0.62  CALCIUM 9.0  GLUCOSE 156*   Lab Results  Component Value Date   CKTOTAL 75 04/18/2013   CKMB 3.0 04/18/2013   TROPONINI <0.30 04/18/2013   Lab Results  Component Value Date   CHOL 170 12/26/2012   CHOL 167 04/27/2012   CHOL 157 07/24/2010   Lab Results  Component Value Date   HDL 70.00 12/26/2012   HDL 72.10 04/27/2012   HDL 58.90 07/24/2010   Lab Results  Component Value Date   LDLCALC 92 12/26/2012   LDLCALC 87 04/27/2012   LDLCALC 92 07/24/2010   Lab Results  Component Value Date   TRIG 42.0 12/26/2012   TRIG 40.0 04/27/2012   TRIG 29.0 07/24/2010   Lab Results  Component Value Date   CHOLHDL 2 12/26/2012   CHOLHDL 2 04/27/2012   CHOLHDL 3 07/24/2010   No results found for this basename: LDLDIRECT    Lab Results  Component Value Date   DDIMER 13.27* 04/18/2013     Radiology/Studies: Dg Chest 2 View  04/10/2013   CLINICAL DATA:  Preop for knee  replacement  EXAM: CHEST  2 VIEW  COMPARISON:  12/24/2011  FINDINGS: Cardiomediastinal silhouette is stable. No acute infiltrate or pleural effusion. No pulmonary edema. Lower   thoracic levoscoliosis. Old left upper rib fractures. Thoracic spine osteopenia.  IMPRESSION: No active disease. Lower thoracic levoscoliosis.   Electronically Signed   By: Liviu  Pop M.D.   On: 04/10/2013 13:48   Ct Angio Chest Pe W/cm &/or Wo Cm  04/18/2013   CLINICAL DATA:  Evaluate pulmonary embolism. Chest pain, burning in throat. History of gastroesophageal reflux disease. History of esophageal dilatation.  EXAM: CT ANGIOGRAPHY CHEST WITH CONTRAST  TECHNIQUE: Multidetector CT imaging of the chest was performed using the standard protocol during bolus administration of intravenous contrast. Multiplanar CT image reconstructions including MIPs were obtained to evaluate the vascular anatomy.  CONTRAST:  100mL OMNIPAQUE IOHEXOL 350 MG/ML SOLN  COMPARISON:  Chest radiograph April 10, 2013  FINDINGS: Main pulmonary artery is not enlarged, no pulmonary arterial filling defect to the level of the subsegmental branches.  Mild biapical pleural parenchymal scarring and trace bronchiectasis. Mild lung base pleural thickening, with mild bibasilar atelectasis and scarring. No solid pulmonary nodules or masses. 3 mm right upper lobe anterior segment ground-glass nodule, no surveillance recommendations.  Tracheobronchial tree is patent and midline. 5 mm pretracheal lymph node, no lymphadenopathy by CT size criteria. Subcarinal calcified lymph nodes. The heart and pericardium are unremarkable. Thoracic aorta is normal in course and caliber with mild intimal thickening calcific atherosclerosis.  Small hiatal hernia. Included view of the abdomen is unremarkable. Bilateral calcified breast implants in place. Patient is osteopenic with mild to moderate chronic T11 compression fracture.  Review of the MIP images confirms the above findings.  IMPRESSION:  No pulmonary embolism.  Mild biapical pleural parenchymal scarring, with bibasilar atelectasis and pleural thickening.   Electronically Signed   By: Shelly  Bloomer   On: 04/18/2013 06:55    EKG: nsr with AV WB block   TELEMETRY: NSR with AVWB block and intermittant CHB  A/P 1. AVWB block and intermittant CHB with at times a wide ventricular escape 2. Chronic dizziness 3. S/p knee replacement 4. Severe arthritis Rec: While her chronic dizziness is not likely to be due to bradycardia, she had clear intermittent complete heart block with a wide ventricular escape at 4 this morning. I asked the patient and she stated that she was awake. Since then she has had 2:1 AV block. Careful review of her history also suggests that she has had episodes where she fell out without warning. She is a bit of a difficult historian however. With all of the above, I would suggest that she undergo PPM insertion. Will attempt to schedule this tomorrow. She would be ready for DC on Thursday from cardiac perspective.  Rachal Dvorsky,M.D     

## 2013-04-19 NOTE — Progress Notes (Signed)
   Subjective: Day of Surgery Procedure(s) (LRB): PERMANENT PACEMAKER INSERTION (N/A) 2 Day Post-Op Procedure(s) (LRB):  RIGHT TOTAL KNEE ARTHROPLASTY, CORTISONE INJECTION LEFT KNEE (Right) Patient reports pain as mild.   Patient seen in rounds with Dr. Lequita Halt.  Sitting up in bed on rounds.  Pulse was in 70's and 80's during visit. Patient is well, but has had some minor complaints of pain in the knee, requiring pain medications Plan is to go Skilled nursing facility after hospital stay.  Objective: Vital signs in last 24 hours: Temp:  [98.1 F (36.7 C)-98.5 F (36.9 C)] 98.1 F (36.7 C) (11/19 1200) Pulse Rate:  [34-84] 66 (11/19 1300) Resp:  [10-22] 12 (11/19 1300) BP: (105-172)/(35-124) 124/48 mmHg (11/19 1200) SpO2:  [94 %-100 %] 95 % (11/19 1300)  Intake/Output from previous day:  Intake/Output Summary (Last 24 hours) at 04/19/13 1400 Last data filed at 04/19/13 1200  Gross per 24 hour  Intake 1254.17 ml  Output   1700 ml  Net -445.83 ml    Intake/Output this shift: Total I/O In: -  Out: 450 [Urine:450]  Labs:  Recent Labs  04/17/13 1120 04/18/13 0350 04/19/13 0315  HGB 12.7 11.1* 9.5*    Recent Labs  04/18/13 0350 04/19/13 0315  WBC 10.7* 9.5  RBC 3.62* 3.16*  HCT 33.4* 29.1*  PLT 224 185    Recent Labs  04/18/13 0350 04/19/13 0315  NA 137 134*  K 4.0 3.8  CL 104 105  CO2 27 25  BUN 12 8  CREATININE 0.62 0.57  GLUCOSE 156* 165*  CALCIUM 9.0 8.7   No results found for this basename: LABPT, INR,  in the last 72 hours  EXAM General - Patient is Alert, Appropriate and Oriented Extremity - Neurovascular intact Sensation intact distally Dorsiflexion/Plantar flexion intact No cellulitis present Dressing/Incision - clean, dry Motor Function - intact, moving foot and toes well on exam.   Past Medical History  Diagnosis Date  . Chronic low back pain   . Dizziness     some  . Hypertension   . Neuralgia, neuritis, and radiculitis,  unspecified   . Degenerative disc disease     CERVICAL SPINE,W/RADICULOPATHY  . GERD (gastroesophageal reflux disease)   . Hyperlipidemia   . Thyroid disease     HYPOTHYROIDISM  . Osteoporosis   . Rib fractures   . Stricture and stenosis of esophagus 2007  . Adenomatous colon polyp 2003  . Premature ventricular contractions 2006  . Hypothyroidism   . Headache(784.0)     not migraines, but a lot of headaches  . Hepatitis     jaundice as child  . SVT (supraventricular tachycardia)     S/P ablation 2005    Assessment/Plan: Day of Surgery Procedure(s) (LRB): PERMANENT PACEMAKER INSERTION (N/A) Principal Problem:   OA (osteoarthritis) of knee Active Problems:   Bradycardia   AV block, 2nd degree   Chest pain  Estimated body mass index is 22.84 kg/(m^2) as calculated from the following:   Height as of this encounter: 5' 2.5" (1.588 m).   Weight as of this encounter: 57.607 kg (127 lb). Up with therapy once she is stable from a cardiac standpoint. Plan for pacemaker.  DVT Prophylaxis - Xarelto Weight-Bearing as tolerated to right leg  PERKINS, ALEXZANDREW 04/19/2013, 2:00 PM

## 2013-04-19 NOTE — Progress Notes (Signed)
Orthopedic Tech Progress Note Patient Details:  Shelly Ross [birth year] [id number]  CPM Right Knee CPM Right Knee: On Right Knee Flexion (Degrees): 40 Right Knee Extension (Degrees): 10 Additional Comments: 4 hrs per day. increase by 10 degrees per day.   [name] 04/19/2013, 5:06 PM

## 2013-04-19 NOTE — Op Note (Signed)
SURGEON:  Hillis Range, MD     PREPROCEDURE DIAGNOSIS:  Symptomatic mobitz II second degree AV block with syncope    POSTPROCEDURE DIAGNOSIS:  Symptomatic mobitz II second degree AV block with syncope     PROCEDURES:   1. Left upper extremity venography.   2. Pacemaker implantation.     INTRODUCTION: Shelly Ross is a 79 y.o. female  with a history of symptomatic sinus bradycardia and mobitz II second degree AV block with syncope who presents today for pacemaker implantation.  The patient reports progressive fatigue and recent intermittent episodes of dizziness with syncope.  She has been documented to have second degree AV block with transient complete heart block. No reversible causes have been identified.  The patient therefore presents today for pacemaker implantation.     DESCRIPTION OF PROCEDURE:  Informed written consent was obtained, and the patient was brought to the electrophysiology lab in a fasting state.  The patient required no sedation for the procedure today.  The patients left chest was prepped and draped in the usual sterile fashion by the EP lab staff. The skin overlying the left deltopectoral region was infiltrated with lidocaine for local analgesia.  A 4-cm incision was made over the left deltopectoral region.  A left subcutaneous pacemaker pocket was fashioned using a combination of sharp and blunt dissection. Electrocautery was required to assure hemostasis.    Left Upper Extremity Venography: A venogram of the left upper extremity was performed, which revealed a small left cephalic vein, which emptied into a mall left subclavian vein.  The left axillary vein was small in size.    RA/RV Lead Placement: The left axillary vein was therefore cannulated.  Through the left axillary vein, a Medtronic model [id number] (serial number [id number]) right atrial lead and a Medtronic model 5092- 58 (serial number [id number]) right ventricular lead were advanced with fluoroscopic  visualization into the right atrial appendage and right ventricular apex positions respectively.  Initial atrial lead P- waves measured 3.2 mV with impedance of 813 ohms and a threshold of 0.5 V at 0.5 msec.  Right ventricular lead R-waves measured 17 mV with an impedance of 614 ohms and a threshold of 0.4 V at 0.5 msec.  Both leads   were secured to the pectoralis fascia using #2-0 silk over the suture sleeves.   Device Placement:  The leads were then connected to a Medtronic Adapta L model ADDRL 1 (serial number [id number]) pacemaker.  The pocket was irrigated with copious gentamicin solution.  The pacemaker was then placed into the pocket.  The pocket was then closed in 2 layers with 2.0 Vicryl suture for the subcutaneous and subcuticular layers.  Steri- Strips and a sterile dressing were then applied.  There were no early apparent complications.     CONCLUSIONS:   1. Successful implantation of a Medtronic Adapta L dual-chamber pacemaker for symptomatic sinus bradycardia and mobitz II second degree AV block with syncope  2. No early apparent complications.           Hillis Range, MD 04/19/2013 3:25 PM

## 2013-04-19 NOTE — Interval H&P Note (Signed)
History and Physical Interval Note:  04/19/2013 1:58 PM  Shelly Ross  has presented today for surgery, with the diagnosis of hb  The various methods of treatment have been discussed with the patient and family. After consideration of risks, benefits and other options for treatment, the patient has consented to  Procedure(s): PERMANENT PACEMAKER INSERTION (N/A) as a surgical intervention .  The patient's history has been reviewed, patient examined, no change in status, stable for surgery.  I have reviewed the patient's chart and labs.  Questions were answered to the patient's satisfaction.    The patient has symptomatic mobitz II second degree AV block. No reversible causes have been found.  I would therefore recommend pacemaker implantation at this time.  Risks, benefits, alternatives to pacemaker implantation were discussed in detail with the patient and her daughter today. The patient understands that the risks include but are not limited to bleeding, infection, pneumothorax, perforation, tamponade, vascular damage, renal failure, MI, stroke, death,  and lead dislodgement and wishes to proceed. We will therefore proceed at this time.   Shelly Ross

## 2013-04-19 NOTE — Progress Notes (Signed)
     SUBJECTIVE: No chest pain this am. No SOB.   BP 145/63  Pulse 79  Temp(Src) 98.3 F (36.8 C) (Oral)  Resp 12  Ht 5' 2.5" (1.588 m)  Wt 127 lb (57.607 kg)  BMI 22.84 kg/m2  SpO2 97%  Intake/Output Summary (Last 24 hours) at 04/19/13 0653 Last data filed at 04/19/13 0500  Gross per 24 hour  Intake 2554.17 ml  Output   1345 ml  Net 1209.17 ml    PHYSICAL EXAM General: Well developed, well nourished, in no acute distress. Alert and oriented x 3.  Psych:  Good affect, responds appropriately Neck: No JVD. No masses noted.  Lungs: Clear bilaterally with no wheezes or rhonci noted.  Heart: RRR with no murmurs noted. Abdomen: Bowel sounds are present. Soft, non-tender.  Extremities: No lower extremity edema.   LABS: Basic Metabolic Panel:  Recent Labs  16/10/96 1120 04/18/13 0350 04/19/13 0315  NA 140 137 134*  K 3.2* 4.0 3.8  CL 108 104 105  CO2 25 27 25   GLUCOSE 155* 156* 165*  BUN 14 12 8   CREATININE 0.56 0.62 0.57  CALCIUM 9.1 9.0 8.7  MG 1.8  --   --    CBC:  Recent Labs  04/18/13 0350 04/19/13 0315  WBC 10.7* 9.5  HGB 11.1* 9.5*  HCT 33.4* 29.1*  MCV 92.3 92.1  PLT 224 185   Cardiac Enzymes:  Recent Labs  04/17/13 1120 04/18/13 0350  CKTOTAL  --  75  CKMB  --  3.0  TROPONINI <0.30 <0.30   Current Meds: . antiseptic oral rinse  15 mL Mouth Rinse BID  .  ceFAZolin (ANCEF) IV  2 g Intravenous 30 min Pre-Op  . docusate sodium  100 mg Oral BID  . famotidine  20 mg Oral Daily  . gentamicin irrigation  80 mg Irrigation On Call  . levothyroxine  37.5 mcg Oral QAC breakfast  . rivaroxaban  10 mg Oral Q breakfast   Echo 04/18/13: Left ventricle: The cavity size was normal. Wall thickness was increased in a pattern of mild LVH. Systolic function was vigorous. The estimated ejection fraction was in the range of 65% to 70%. Wall motion was normal; there were no regional wall motion abnormalities. Doppler parameters are consistent with  abnormal left ventricular relaxation (grade 1 diastolic dysfunction). - Aortic valve: Trivial regurgitation. - Mitral valve: Calcified annulus. Mild regurgitation. - Left atrium: The atrium was mildly dilated. - Tricuspid valve: Moderate regurgitation. - Pulmonary arteries: Systolic pressure was mildly increased. PA peak pressure: 40mm Hg (S).  ASSESSMENT AND PLAN:  1. Bradycardia/AV block: Sinus bradycardia on telemetry with high grade AV block, complete heart block. She has been seen by EP and plans in place for permanent pacemaker today. She is hemodynamically stable at this time. Echo today to assess LV function.   2. S/p right total knee arthroplasty: Management per orthopedic surgery.   3. Chest pain: Resolved. NO signs of ischemia. CTA negative for PE. Cardiac markers negative. Pepcid helped with chest pain. Likely GERD.   Shelly Ross,Shelly Ross  11/19/20146:53 AM

## 2013-04-20 ENCOUNTER — Inpatient Hospital Stay (HOSPITAL_COMMUNITY): Payer: Medicare Other

## 2013-04-20 LAB — CBC
MCH: 30.9 pg (ref 26.0–34.0)
MCHC: 33.1 g/dL (ref 30.0–36.0)
Platelets: 181 10*3/uL (ref 150–400)

## 2013-04-20 LAB — BASIC METABOLIC PANEL
BUN: 11 mg/dL (ref 6–23)
Calcium: 8.5 mg/dL (ref 8.4–10.5)
Chloride: 106 mEq/L (ref 96–112)
GFR calc Af Amer: >90 mL/min (ref >90)
GFR calc non Af Amer: 81 mL/min — ABNORMAL LOW (ref >90)
Sodium: 136 mEq/L (ref 135–145)

## 2013-04-20 MED ORDER — RIVAROXABAN 10 MG PO TABS
10.0000 mg | ORAL_TABLET | Freq: Every day | ORAL | Status: DC
  Administered 2013-04-20 – 2013-04-22 (×3): 10 mg via ORAL
  Filled 2013-04-20 (×3): qty 1

## 2013-04-20 MED ORDER — POLYSACCHARIDE IRON COMPLEX 150 MG PO CAPS
150.0000 mg | ORAL_CAPSULE | Freq: Two times a day (BID) | ORAL | Status: DC
  Administered 2013-04-20 – 2013-04-22 (×4): 150 mg via ORAL
  Filled 2013-04-20 (×7): qty 1

## 2013-04-20 NOTE — Progress Notes (Signed)
Subjective: 1 Day Post-Op Procedure(s) (LRB): PERMANENT PACEMAKER INSERTION (N/A) 3 Day Post-Op Procedure(s) (LRB):  RIGHT TOTAL KNEE ARTHROPLASTY, CORTISONE INJECTION LEFT KNEE (Right) Patient reports pain as moderate.   Patient seen in rounds for Dr. Lequita Halt. Patient is having problems with pain in the knee, requiring pain medications and also slow to get up with therapy.  She has been in the bed for three days.  Spoke with the cardiology team and okay for rehab from their standpoint.   Plan is to go Skilled nursing facility after hospital stay.  Objective: Vital signs in last 24 hours: Temp:  [97.5 F (36.4 C)-99.2 F (37.3 C)] 99.2 F (37.3 C) (11/20 0601) Pulse Rate:  [66-84] 84 (11/20 0601) Resp:  [12-18] 17 (11/20 0800) BP: (93-134)/(37-85) 98/45 mmHg (11/20 0601) SpO2:  [91 %-99 %] 93 % (11/20 0800)  Intake/Output from previous day:  Intake/Output Summary (Last 24 hours) at 04/20/13 1037 Last data filed at 04/20/13 0826  Gross per 24 hour  Intake    243 ml  Output    950 ml  Net   -707 ml    Intake/Output this shift: Total I/O In: 240 [P.O.:240] Out: -   Labs:  Recent Labs  04/17/13 1120 04/18/13 0350 04/19/13 0315 04/20/13 0417  HGB 12.7 11.1* 9.5* 8.6*    Recent Labs  04/19/13 0315 04/20/13 0417  WBC 9.5 8.7  RBC 3.16* 2.78*  HCT 29.1* 26.0*  PLT 185 181    Recent Labs  04/19/13 0315 04/20/13 0417  NA 134* 136  K 3.8 4.0  CL 105 106  CO2 25 24  BUN 8 11  CREATININE 0.57 0.59  GLUCOSE 165* 91  CALCIUM 8.7 8.5   No results found for this basename: LABPT, INR,  in the last 72 hours  EXAM General - Patient is Alert, Appropriate and Oriented Extremity - Neurovascular intact Sensation intact distally Dorsiflexion/Plantar flexion intact No cellulitis present Dressing/Incision - clean, dry, no drainage, healing, small blister noted on distal lateral incision. Motor Function - intact, moving foot and toes well on exam.   Past  Medical History  Diagnosis Date  . Chronic low back pain   . Dizziness     some  . Hypertension   . Neuralgia, neuritis, and radiculitis, unspecified   . Degenerative disc disease     CERVICAL SPINE,W/RADICULOPATHY  . GERD (gastroesophageal reflux disease)   . Hyperlipidemia   . Thyroid disease     HYPOTHYROIDISM  . Osteoporosis   . Rib fractures   . Stricture and stenosis of esophagus 2007  . Adenomatous colon polyp 2003  . Premature ventricular contractions 2006  . Hypothyroidism   . Headache(784.0)     not migraines, but a lot of headaches  . Hepatitis     jaundice as child  . SVT (supraventricular tachycardia)     S/P ablation 2005    Assessment/Plan: 1 Day Post-Op Procedure(s) (LRB): PERMANENT PACEMAKER INSERTION (N/A) Principal Problem:   OA (osteoarthritis) of knee Active Problems:   Bradycardia   AV block, 2nd degree   Chest pain  Estimated body mass index is 22.84 kg/(m^2) as calculated from the following:   Height as of this encounter: 5' 2.5" (1.588 m).   Weight as of this encounter: 57.607 kg (127 lb). Up with therapy Plan for discharge tomorrow Discharge to SNF if progresses  DVT Prophylaxis - Xarelto Weight-Bearing as tolerated to rightleg Unable to do SLR yet.  Will place back into Knee immobilizer only for  walking until able to perform SLRs  Shelly Ross 04/20/2013, 10:37 AM

## 2013-04-20 NOTE — Evaluation (Signed)
Occupational Therapy Evaluation Patient Details Name: Shelly Ross MRN: [id number] DOB: [birth year] Today's Date: 04/20/2013 Time: 0865-7846 OT Time Calculation (min): 24 min  OT Assessment / Plan / Recommendation History of present illness Pt is a 79 year old female with a past medical history significant for hypertension, hyperlipidemia, hypothyroidism, AVNRT (s/p slow pathway modification 2005), and chronic dizziness.  She was admitted yesterday and underwent right total knee arthroscopy and left knee cortisone injection.  In the PACU, she was noted to have periods of bradycardia and has been monitored on telemetry.  Telemetry has demonstrated intermittent complete heart block with rates into the 30's and a wide ventricular escape at times.  Pacemaker place 11/19   Clinical Impression   Pt with generalized weakness due to prolonged bedrest.  Requiring maximum assistance for mobility performed today, unable to ambulate rendering pt dependent in ADL and ADL transfers.  Pt will require rehab in skilled nursing upon d/c.  Will defer OT to SNF.   OT Assessment  All further OT needs can be met in the next venue of care    Follow Up Recommendations  SNF    Barriers to Discharge      Equipment Recommendations       Recommendations for Other Services    Frequency       Precautions / Restrictions Precautions Precautions: Fall Precaution Comments: Pacemaker placed 11/20, Lt. UE pacemaker restrictions Required Braces or Orthoses: Knee Immobilizer - Right Knee Immobilizer - Right: On when out of bed or walking Restrictions Weight Bearing Restrictions: Yes RLE Weight Bearing: Weight bearing as tolerated   Pertinent Vitals/Pain R knee, did not rate, repositioned, VSS    ADL  Eating/Feeding: Independent Where Assessed - Eating/Feeding: Bed level Grooming: Wash/dry hands;Wash/dry face;Supervision/safety Where Assessed - Grooming: Unsupported sitting Upper Body Bathing: Minimal  assistance Where Assessed - Upper Body Bathing: Unsupported sitting Lower Body Bathing: +1 Total assistance Where Assessed - Lower Body Bathing: Unsupported sitting;Supported sit to stand Upper Body Dressing: Minimal assistance Where Assessed - Upper Body Dressing: Unsupported sitting Lower Body Dressing: +1 Total assistance Where Assessed - Lower Body Dressing: Unsupported sitting;Supported sit to stand Equipment Used: Gait belt;Knee Immobilizer    OT Diagnosis: Generalized weakness;Acute pain  OT Problem List: Decreased strength;Decreased activity tolerance;Impaired balance (sitting and/or standing);Decreased knowledge of use of DME or AE;Pain OT Treatment Interventions:     OT Goals(Current goals can be found in the care plan section) Acute Rehab OT Goals Patient Stated Goal: none specified  Visit Information  Last OT Received On: 04/20/13 Assistance Needed: +2 History of Present Illness: Pt is a 79 year old female with a past medical history significant for hypertension, hyperlipidemia, hypothyroidism, AVNRT (s/p slow pathway modification 2005), and chronic dizziness.  She was admitted yesterday and underwent right total knee arthroscopy and left knee cortisone injection.  In the PACU, she was noted to have periods of bradycardia and has been monitored on telemetry.  Telemetry has demonstrated intermittent complete heart block with rates into the 30's and a wide ventricular escape at times.  Pacemaker place 11/19       Prior Functioning     Home Living Family/patient expects to be discharged to:: Other (Comment) Living Arrangements: Alone Home Equipment: [place] - 4 wheels;Cane - single point;Tub bench Prior Function Level of Independence: Independent with assistive device(s) Comments: pt reports occassionally using rollator during ambulation for long distances and was ind. in all ADLs. Communication Communication: No difficulties Dominant Hand: Right  Vision/Perception Vision - History Patient Visual Report: No change from baseline   Cognition  Cognition Arousal/Alertness: Awake/alert Behavior During Therapy: WFL for tasks assessed/performed Overall Cognitive Status: Within Functional Limits for tasks assessed    Extremity/Trunk Assessment Upper Extremity Assessment Upper Extremity Assessment: Overall WFL for tasks assessed Lower Extremity Assessment Lower Extremity Assessment: Defer to PT evaluation RLE Deficits / Details: no reports of numbness/tingling     Mobility Bed Mobility Bed Mobility: Supine to Sit;Sit to Supine Supine to Sit: 3: Mod assist Sit to Supine: 2: Max assist Details for Bed Mobility Assistance: verbal cues to avoid use of L UE and to assist with LEs Transfers Transfers: Sit to Stand;Stand to Sit Sit to Stand: 2: Max assist;With upper extremity assist;From elevated surface;From bed Stand to Sit: 2: Max assist;With upper extremity assist;To elevated surface;To bed Details for Transfer Assistance: did not transfer pt in absence of second person     Exercise     Balance Balance Balance Assessed: Yes Static Sitting Balance Static Sitting - Balance Support: Feet supported Static Sitting - Level of Assistance: 5: Stand by assistance Static Sitting - Comment/# of Minutes: 5   End of Session OT - End of Session Equipment Utilized During Treatment: Gait belt Activity Tolerance: Patient limited by fatigue;Patient limited by pain Patient left: in bed;with call bell/phone within reach  GO     Evern Bio 04/20/2013, 3:52 PM [phone]

## 2013-04-20 NOTE — Progress Notes (Signed)
Patient: Shelly Ross Date of Encounter: 04/20/2013, 7:43 AM Admit date: 04/17/2013     Subjective  Ms. Shelly Ross has no new complaints.    Objective  Physical Exam: Vitals: BP 98/45  Pulse 84  Temp(Src) 99.2 F (37.3 C) (Oral)  Resp 18  Ht 5' 2.5" (1.588 m)  Wt 127 lb (57.607 kg)  BMI 22.84 kg/m2  SpO2 91% General: Well developed, elderly 79 year old female in no acute distress. Neck: Supple. JVD not elevated. Lungs: Clear bilaterally to auscultation without wheezes, rales, or rhonchi. Breathing is unlabored. Heart: RRR S1 S2 without murmurs, rubs, or gallops.  Abdomen: Soft, non-distended. Extremities: No clubbing or cyanosis. No edema.  Distal pedal pulses are 2+ and equal bilaterally. Neuro: Alert and oriented X 3. Moves all extremities spontaneously. No focal deficits. Skin: Left upper chest / implant site intact without hematoma.  Intake/Output:  Intake/Output Summary (Last 24 hours) at 04/20/13 0743 Last data filed at 04/20/13 0603  Gross per 24 hour  Intake      3 ml  Output    950 ml  Net   -947 ml    Inpatient Medications:  . antiseptic oral rinse  15 mL Mouth Rinse BID  . docusate sodium  100 mg Oral BID  . famotidine  20 mg Oral Daily  . levothyroxine  37.5 mcg Oral QAC breakfast  . sodium chloride  3 mL Intravenous Q12H    Labs:  Recent Labs  04/17/13 1120 04/18/13 0350 04/19/13 0315  NA 140 137 134*  K 3.2* 4.0 3.8  CL 108 104 105  CO2 25 27 25   GLUCOSE 155* 156* 165*  BUN 14 12 8   CREATININE 0.56 0.62 0.57  CALCIUM 9.1 9.0 8.7  MG 1.8  --   --     Recent Labs  04/19/13 0315 04/20/13 0417  WBC 9.5 8.7  HGB 9.5* 8.6*  HCT 29.1* 26.0*  MCV 92.1 93.5  PLT 185 181    Recent Labs  04/17/13 1120 04/18/13 0350  CKTOTAL  --  75  CKMB  --  3.0  TROPONINI <0.30 <0.30    Recent Labs  04/18/13 0350  DDIMER 13.27*    Radiology/Studies: Dg Chest 2 View 04/10/2013     IMPRESSION: No active disease. Lower thoracic  levoscoliosis.    Electronically Signed   By: Natasha Mead M.D.   On: 04/10/2013 13:48   Ct Angio Chest Pe W/cm &/or Wo Cm 04/18/2013     IMPRESSION: No pulmonary embolism.  Mild biapical pleural parenchymal scarring, with bibasilar atelectasis and pleural thickening.    Electronically Signed   By: Awilda Metro   On: 04/18/2013 06:55   Chest x-ray this AM shows stable lead placement - official report pending Device interrogation performed by industry this AM shows normal PPM function   Assessment and Plan  Symptomatic Mobitz II AV block with syncope s/p PPM implant  Ms. Shelly Ross is doing well post PPM implant. CXR shows stable lead placement. Wound intact without hematoma. Device interrogation shows normal PPM function.   Dr. Johney Frame to see Signed, EDMISTEN, BROOKE PA-C  I have seen, examined the patient, and reviewed the above assessment and plan.  Changes to above are made where necessary.  Doing well s/p PPM placement.  CXR and device interrogation are reviewed. No further inpatient EP care required. We will see as needed Will order wound check for our office in 10 days.   OK to restart low dose xarelto for  DVT prophylaxis if medically necessary. Please call with questions.  Co Sign: Hillis Range, MD 04/20/2013 8:29 AM

## 2013-04-20 NOTE — Progress Notes (Signed)
Physical Therapy Treatment Patient Details Name: Shelly Ross MRN: [id number] DOB: [birth year] Today's Date: 04/20/2013 Time: 4098-1191 PT Time Calculation (min): 43 min  PT Assessment / Plan / Recommendation  History of Present Illness Pt is a 79 year old female with a past medical history significant for hypertension, hyperlipidemia, hypothyroidism, AVNRT (s/p slow pathway modification 2005), and chronic dizziness.  She was admitted yesterday and underwent right total knee arthroscopy and left knee cortisone injection.  In the PACU, she was noted to have periods of bradycardia and has been monitored on telemetry.  Telemetry has demonstrated intermittent complete heart block with rates into the 30's and a wide ventricular escape at times.  Pacemaker place 11/19   PT Comments   Pt stiff and weak from being in the bed too long but tolerated upright position well. Currently max assist for Sit <> stand and only able to take 1-2 steps with max assist. Strongly encouraged appropriate placement for improving extension at rest as pt appears to have lost some extension from evaluation. Pt not appropriate or safe to return to Independent living at this time, will benefit from acute therapy and extended therapy at SNF. Will follow pt acutely for deficits listed.  Follow Up Recommendations  SNF;Supervision/Assistance - 24 hour           Equipment Recommendations   (defer to SNF)       Frequency 7X/week   Progress towards PT Goals Progress towards PT goals: Progressing toward goals  Plan Current plan remains appropriate    Precautions / Restrictions Precautions Precautions: Fall Precaution Comments: Pacemaker placed 11/20, Lt. UE pacemaker restrictions Required Braces or Orthoses: Knee Immobilizer - Right (order disctontinued but ortho requested pt wear one) Knee Immobilizer - Right: On when out of bed or walking Restrictions Weight Bearing Restrictions: Yes RLE Weight Bearing: Weight bearing  as tolerated   Pertinent Vitals/Pain Faces 6/10 Rt. Knee with activity, RN made aware.    Mobility  Bed Mobility Bed Mobility: Supine to Sit;Sit to Supine Supine to Sit: 3: Mod assist Sit to Supine: 2: Max assist Details for Bed Mobility Assistance: Increased assist needed due to extra precaution taken secondary to pacemaker placement. Pt requires frequent cues to avoid using Lt. UE Transfers Transfers: Sit to Stand;Stand to Sit Sit to Stand: From bed;2: Max assist;1: +2 Total assist Stand to Sit: 2: Max assist;1: +2 Total assist Details for Transfer Assistance: Performed x 2 reps. Pt very slow to fully extend Lt. LE and get Rt. LE under self. Unable to perform first attempt secondary to posterior weight shift. After demonstration able to obtain standing. mod verbal and tactile cues for UE/LE placement for maintaining pacemaker precautions of Lt. UE and for pain modulation of Rt. LE.  Ambulation/Gait Ambulation/Gait Assistance: 2: Max assist Ambulation Distance (Feet): 1 Feet (step forwards/backwards first attempt, second attempt side stepping along bed) Assistive device: Rolling [place] Gait Pattern: Decreased stance time - right;Step-to pattern;Trunk flexed;Decreased step length - right;Decreased step length - left Stairs: No    Exercises Total Joint Exercises Ankle Circles/Pumps: AROM;20 reps;Both;Supine Quad Sets: AROM;Supine;Right;10 reps Heel Slides: Supine;Right;10 reps;AAROM (pt required increased time during all exercises due to pain and fatigue.) Hip ABduction/ADduction: AROM;Supine;Right;10 reps Goniometric ROM: Lacking ~15-20 degrees of Rt. knee extension, flexion limited to ~40 degrees    PT Goals (current goals can now be found in the care plan section)    Visit Information  Last PT Received On: 04/20/13 Assistance Needed: +2 History of Present Illness: Pt is a  79 year old female with a past medical history significant for hypertension, hyperlipidemia, hypothyroidism,  AVNRT (s/p slow pathway modification 2005), and chronic dizziness.  She was admitted yesterday and underwent right total knee arthroscopy and left knee cortisone injection.  In the PACU, she was noted to have periods of bradycardia and has been monitored on telemetry.  Telemetry has demonstrated intermittent complete heart block with rates into the 30's and a wide ventricular escape at times.  Pacemaker place 11/19    Subjective Data   "I've been in bed a long time!"   Cognition  Cognition Arousal/Alertness: Awake/alert Behavior During Therapy: [facility] for tasks assessed/performed Overall Cognitive Status: Within Functional Limits for tasks assessed (mild delays likely age related)    Balance  Balance Balance Assessed: Yes Static Standing Balance Static Standing - Balance Support: Bilateral upper extremity supported Static Standing - Level of Assistance: 4: Min assist Static Standing - Comment/# of Minutes: slightly increased sway  End of Session PT - End of Session Equipment Utilized During Treatment: Gait belt Activity Tolerance: Patient limited by fatigue;Patient limited by pain Patient left: in bed;with call bell/phone within reach;with family/visitor present (Rn request pt back to bed for CPM ) Nurse Communication: Mobility status;Patient requests pain meds CPM Right Knee CPM Right Knee: On Right Knee Flexion (Degrees): 40 Right Knee Extension (Degrees): 0   GP     Sherrine Maples Cheek 04/20/2013, 10:53 AM

## 2013-04-20 NOTE — Progress Notes (Signed)
Orthopedic Tech Progress Note Patient Details:  Shelly Ross Jun 03, 1924 [id number]  Ortho Devices Type of Ortho Device: Knee Immobilizer Ortho Device/Splint Interventions: Application   Shawnie Pons 04/20/2013, 10:52 AM

## 2013-04-21 DIAGNOSIS — E871 Hypo-osmolality and hyponatremia: Secondary | ICD-10-CM

## 2013-04-21 DIAGNOSIS — E876 Hypokalemia: Secondary | ICD-10-CM

## 2013-04-21 DIAGNOSIS — D62 Acute posthemorrhagic anemia: Secondary | ICD-10-CM

## 2013-04-21 LAB — CBC
Hemoglobin: 8.4 g/dL — ABNORMAL LOW (ref 12.0–15.0)
MCH: 30.7 pg (ref 26.0–34.0)
MCV: 92.7 fL (ref 78.0–100.0)
RBC: 2.74 MIL/uL — ABNORMAL LOW (ref 3.87–5.11)
WBC: 8.4 10*3/uL (ref 4.0–10.5)

## 2013-04-21 LAB — BASIC METABOLIC PANEL
CO2: 29 mEq/L (ref 19–32)
GFR calc non Af Amer: 83 mL/min — ABNORMAL LOW (ref >90)
Glucose, Bld: 98 mg/dL (ref 70–99)
Potassium: 3.8 mEq/L (ref 3.5–5.1)
Sodium: 137 mEq/L (ref 135–145)

## 2013-04-21 MED ORDER — FAMOTIDINE 20 MG PO TABS: 20.0000 mg | ORAL_TABLET | Freq: Every day | ORAL | Status: DC

## 2013-04-21 MED ORDER — METHOCARBAMOL 500 MG PO TABS: 500.0000 mg | ORAL_TABLET | Freq: Four times a day (QID) | ORAL | Status: DC | PRN

## 2013-04-21 MED ORDER — TRAMADOL HCL 50 MG PO TABS: 50.0000 mg | ORAL_TABLET | Freq: Four times a day (QID) | ORAL | Status: DC | PRN

## 2013-04-21 MED ORDER — ACETAMINOPHEN 325 MG PO TABS: 325.0000 mg | ORAL_TABLET | ORAL | Status: DC | PRN

## 2013-04-21 MED ORDER — POLYETHYLENE GLYCOL 3350 17 G PO PACK: 17.0000 g | PACK | Freq: Every day | ORAL | Status: DC | PRN

## 2013-04-21 MED ORDER — BIOTENE DRY MOUTH MT LIQD: 15.0000 mL | Freq: Two times a day (BID) | OROMUCOSAL | Status: DC

## 2013-04-21 MED ORDER — RIVAROXABAN 10 MG PO TABS: 10.0000 mg | ORAL_TABLET | Freq: Every day | ORAL | Status: DC

## 2013-04-21 MED ORDER — BISACODYL 10 MG RE SUPP
10.0000 mg | Freq: Once | RECTAL | Status: AC
  Administered 2013-04-21: 10 mg via RECTAL
  Filled 2013-04-21: qty 1

## 2013-04-21 MED ORDER — BISACODYL 10 MG RE SUPP: 10.0000 mg | Freq: Every day | RECTAL | Status: DC | PRN

## 2013-04-21 MED ORDER — POLYSACCHARIDE IRON COMPLEX 150 MG PO CAPS: 150.0000 mg | ORAL_CAPSULE | Freq: Two times a day (BID) | ORAL | Status: DC

## 2013-04-21 MED ORDER — ALUM & MAG HYDROXIDE-SIMETH 200-200-20 MG/5ML PO SUSP: 30.0000 mL | Freq: Four times a day (QID) | ORAL | Status: DC | PRN

## 2013-04-21 MED ORDER — HYDROMORPHONE HCL 2 MG PO TABS: 2.0000 mg | ORAL_TABLET | ORAL | Status: DC | PRN

## 2013-04-21 MED ORDER — DSS 100 MG PO CAPS: 100.0000 mg | ORAL_CAPSULE | Freq: Two times a day (BID) | ORAL | Status: DC

## 2013-04-21 MED ORDER — ONDANSETRON HCL 4 MG PO TABS: 4.0000 mg | ORAL_TABLET | Freq: Four times a day (QID) | ORAL | Status: DC | PRN

## 2013-04-21 NOTE — Progress Notes (Signed)
Physical Therapy Treatment Patient Details Name: Shelly Ross MRN: [id number] DOB: 03-15-1925 Today's Date: 04/21/2013 Time: 1151-1208 PT Time Calculation (min): 17 min  PT Assessment / Plan / Recommendation  History of Present Illness Pt is a 79 year old female with a past medical history significant for hypertension, hyperlipidemia, hypothyroidism, AVNRT (s/p slow pathway modification 2005), and chronic dizziness.  She was admitted yesterday and underwent right total knee arthroscopy and left knee cortisone injection.  In the PACU, she was noted to have periods of bradycardia and has been monitored on telemetry.  Telemetry has demonstrated intermittent complete heart block with rates into the 30's and a wide ventricular escape at times.  Pacemaker place 11/19   PT Comments   Patient making slow gains with mobility and gait today.  Agree with need for SNF.  Follow Up Recommendations  SNF;Supervision/Assistance - 24 hour     Does the patient have the potential to tolerate intense rehabilitation     Barriers to Discharge        Equipment Recommendations  None recommended by PT    Recommendations for Other Services    Frequency 7X/week   Progress towards PT Goals Progress towards PT goals: Progressing toward goals  Plan Current plan remains appropriate    Precautions / Restrictions Precautions Precautions: Knee;Fall;ICD/Pacemaker Precaution Comments: Pacemaker placed 11/20, Lt. UE pacemaker restrictions Required Braces or Orthoses: Knee Immobilizer - Right Knee Immobilizer - Right: On when out of bed or walking Restrictions Weight Bearing Restrictions: Yes RLE Weight Bearing: Weight bearing as tolerated   Pertinent Vitals/Pain Pain impacting mobility - 8/10 with ambulation    Mobility  Bed Mobility Bed Mobility: Not assessed Transfers Transfers: Sit to Stand;Stand to Sit Sit to Stand: 1: +2 Total assist;With upper extremity assist;With armrests;From chair/3-in-1 (Using  RUE) Sit to Stand: Patient Percentage: 60% Stand to Sit: 1: +2 Total assist;With upper extremity assist;With armrests;To chair/3-in-1 (Using RUE) Stand to Sit: Patient Percentage: 70% Details for Transfer Assistance: Verbal cues for use of RUE, and cues for LUE restrictions.  Assist to rise to standing and for balance. Ambulation/Gait Ambulation/Gait Assistance: 3: Mod assist (+1 for chair) Ambulation Distance (Feet): 10 Feet Assistive device: Rolling [place] Ambulation/Gait Assistance Details: Verbal cues for safe use of RW and gait sequence.  Assist to maneuver RW. Gait Pattern: Step-to pattern;Decreased stance time - right;Decreased step length - left;Antalgic;Trunk flexed Gait velocity: Slow gait speed      PT Goals (current goals can now be found in the care plan section)    Visit Information  Last PT Received On: 04/21/13 Assistance Needed: +2 History of Present Illness: Pt is a 79 year old female with a past medical history significant for hypertension, hyperlipidemia, hypothyroidism, AVNRT (s/p slow pathway modification 2005), and chronic dizziness.  She was admitted yesterday and underwent right total knee arthroscopy and left knee cortisone injection.  In the PACU, she was noted to have periods of bradycardia and has been monitored on telemetry.  Telemetry has demonstrated intermittent complete heart block with rates into the 30's and a wide ventricular escape at times.  Pacemaker place 11/19    Subjective Data  Subjective: "Whatever you want me to do, I'll do"   Cognition  Cognition Arousal/Alertness: Awake/alert Behavior During Therapy: WFL for tasks assessed/performed Overall Cognitive Status: Within Functional Limits for tasks assessed    Balance  Static Standing Balance Static Standing - Balance Support: Bilateral upper extremity supported Static Standing - Level of Assistance: 4: Min assist Static Standing - Comment/#  of Minutes: 3 minutes to adjust KI on RLE.   Patient leaning posteriorly  End of Session PT - End of Session Equipment Utilized During Treatment: Gait belt Activity Tolerance: Patient limited by fatigue;Patient limited by pain Patient left: in chair;with call bell/phone within reach;with family/visitor present Nurse Communication: Mobility status   GP     Vena Austria 04/21/2013, 12:23 PM Durenda Hurt. Renaldo Fiddler, [facility] Acute Rehab Services Pager [phone]

## 2013-04-21 NOTE — Progress Notes (Signed)
Subjective: 2 Days Post-Op Procedure(s) (LRB): PERMANENT PACEMAKER INSERTION (N/A) Patient reports pain as mild.  Family in room at this time. Patient seen in rounds for Dr. Lequita Halt.  She did get up yesterday but has not been up yet today.  Will need therapy this morning and Dr. Lequita Halt to see patient this afternoon to see if ready to go the the SNF - [facility]. Patient is still having some pain but looks better today. Patient is planning on going to [facility] probably later today.  Dr. Lequita Halt to evaluate later today after lunch  Objective: Vital signs in last 24 hours: Temp:  [98 F (36.7 C)-99.2 F (37.3 C)] 99.2 F (37.3 C) (11/21 0610) Pulse Rate:  [72-80] 80 (11/21 0610) Resp:  [17-19] 19 (11/20 1600) BP: (95-125)/(52-59) 125/52 mmHg (11/21 0610) SpO2:  [91 %-96 %] 96 % (11/21 0610) Weight:  [57.607 kg (127 lb)] 57.607 kg (127 lb) (11/21 0610)  Intake/Output from previous day:  Intake/Output Summary (Last 24 hours) at 04/21/13 1005 Last data filed at 04/20/13 2200  Gross per 24 hour  Intake    240 ml  Output    600 ml  Net   -360 ml    Intake/Output this shift:    Labs:  Recent Labs  04/19/13 0315 04/20/13 0417 04/21/13 0550  HGB 9.5* 8.6* 8.4*    Recent Labs  04/20/13 0417 04/21/13 0550  WBC 8.7 8.4  RBC 2.78* 2.74*  HCT 26.0* 25.4*  PLT 181 206    Recent Labs  04/20/13 0417 04/21/13 0550  NA 136 137  K 4.0 3.8  CL 106 105  CO2 24 29  BUN 11 11  CREATININE 0.59 0.54  GLUCOSE 91 98  CALCIUM 8.5 8.2*   No results found for this basename: LABPT, INR,  in the last 72 hours  EXAM: General - Patient is Alert, Appropriate and Oriented Extremity - Neurovascular intact Sensation intact distally Dorsiflexion/Plantar flexion intact Incision - clean, dry, no drainage, healing Motor Function - intact, moving foot and toes well on exam.   Assessment/Plan: 2 Days Post-Op Procedure(s) (LRB): PERMANENT PACEMAKER INSERTION  (N/A) Procedure(s) (LRB): PERMANENT PACEMAKER INSERTION (N/A) Past Medical History  Diagnosis Date  . Chronic low back pain   . Dizziness     some  . Hypertension   . Neuralgia, neuritis, and radiculitis, unspecified   . Degenerative disc disease     CERVICAL SPINE,W/RADICULOPATHY  . GERD (gastroesophageal reflux disease)   . Hyperlipidemia   . Thyroid disease     HYPOTHYROIDISM  . Osteoporosis   . Rib fractures   . Stricture and stenosis of esophagus 2007  . Adenomatous colon polyp 2003  . Premature ventricular contractions 2006  . Hypothyroidism   . Headache(784.0)     not migraines, but a lot of headaches  . Hepatitis     jaundice as child  . SVT (supraventricular tachycardia)     S/P ablation 2005   Principal Problem:   OA (osteoarthritis) of knee Active Problems:   Bradycardia   AV block, 2nd degree   Chest pain  Estimated body mass index is 22.84 kg/(m^2) as calculated from the following:   Height as of this encounter: 5' 2.5" (1.588 m).   Weight as of this encounter: 57.607 kg (127 lb). Discharge to SNF - [facility]  Diet - Cardiac diet Follow up - in 2 weeks Activity - WBAT Disposition - Skilled nursing facility - Skilled unit at [facility] Condition Upon Discharge -  pending at this time. D/C Meds - See DC Summary DVT Prophylaxis - Xarelto  PERKINS, ALEXZANDREW 04/21/2013, 10:05 AM

## 2013-04-21 NOTE — Discharge Summary (Signed)
Physician Discharge Summary   Patient ID: Shelly Ross MRN: [id number] DOB/AGE: 03-Dec-1924 79 y.o.  Admit date: 04/17/2013 Discharge date: 04/21/2013  Primary Diagnosis:  Osteoarthritis Bilateral knee(s)  Admission Diagnoses:  Past Medical History  Diagnosis Date  . Chronic low back pain   . Dizziness     some  . Hypertension   . Neuralgia, neuritis, and radiculitis, unspecified   . Degenerative disc disease     CERVICAL SPINE,W/RADICULOPATHY  . GERD (gastroesophageal reflux disease)   . Hyperlipidemia   . Thyroid disease     HYPOTHYROIDISM  . Osteoporosis   . Rib fractures   . Stricture and stenosis of esophagus 2007  . Adenomatous colon polyp 2003  . Premature ventricular contractions 2006  . Hypothyroidism   . Headache(784.0)     not migraines, but a lot of headaches  . Hepatitis     jaundice as child  . SVT (supraventricular tachycardia)     S/P ablation 2005   Discharge Diagnoses:   Principal Problem:   OA (osteoarthritis) of knee Active Problems:   Bradycardia   AV block, 2nd degree   Chest pain  Estimated body mass index is 22.84 kg/(m^2) as calculated from the following:   Height as of this encounter: 5' 2.5" (1.588 m).   Weight as of this encounter: 57.607 kg (127 lb).  Procedure:  Procedure(s) (LRB): PERMANENT PACEMAKER INSERTION (N/A)   Consults: cardiology  HPI: Shelly Ross is a 79 y.o. year old female with end stage OA of her right knee with progressively worsening pain and dysfunction. She has constant pain, with activity and at rest and significant functional deficits with difficulties even with ADLs. She has had extensive non-op management including analgesics, injections of cortisone, and home exercise program, but remains in significant pain with significant dysfunction.Radiographs show bone on bone arthritis all 3 compartments. She presents now for right Total Knee Arthroplasty. She also has significant arthritis left knee and is  requesting a cortisone injection.  Laboratory Data: Admission on 04/17/2013  Component Date Value Range Status  . Sodium 04/17/2013 140  135 - 145 mEq/L Final  . Potassium 04/17/2013 3.2* 3.5 - 5.1 mEq/L Final  . Chloride 04/17/2013 108  96 - 112 mEq/L Final  . CO2 04/17/2013 25  19 - 32 mEq/L Final  . Glucose, Bld 04/17/2013 155* 70 - 99 mg/dL Final  . BUN 78/29/5621 14  6 - 23 mg/dL Final  . Creatinine, Ser 04/17/2013 0.56  0.50 - 1.10 mg/dL Final  . Calcium 30/86/5784 9.1  8.4 - 10.5 mg/dL Final  . GFR calc non Af Amer 04/17/2013 82* >90 mL/min Final  . GFR calc Af Amer 04/17/2013 >90  >90 mL/min Final   Comment: (NOTE)                          The eGFR has been calculated using the CKD EPI equation.                          This calculation has not been validated in all clinical situations.                          eGFR's persistently <90 mL/min signify possible Chronic Kidney                          Disease.  . WBC 04/17/2013  11.8* 4.0 - 10.5 K/uL Final  . RBC 04/17/2013 4.17  3.87 - 5.11 MIL/uL Final  . Hemoglobin 04/17/2013 12.7  12.0 - 15.0 g/dL Final  . HCT 95/62/1308 38.0  36.0 - 46.0 % Final  . MCV 04/17/2013 91.1  78.0 - 100.0 fL Final  . MCH 04/17/2013 30.5  26.0 - 34.0 pg Final  . MCHC 04/17/2013 33.4  30.0 - 36.0 g/dL Final  . RDW 65/78/4696 12.7  11.5 - 15.5 % Final  . Platelets 04/17/2013 228  150 - 400 K/uL Final  . Magnesium 04/17/2013 1.8  1.5 - 2.5 mg/dL Final  . Troponin I 29/52/8413 <0.30  <0.30 ng/mL Final   Comment:                                 Due to the release kinetics of cTnI,                          a negative result within the first hours                          of the onset of symptoms does not rule out                          myocardial infarction with certainty.                          If myocardial infarction is still suspected,                          repeat the test at appropriate intervals.  . WBC 04/18/2013 10.7* 4.0 - 10.5 K/uL  Final  . RBC 04/18/2013 3.62* 3.87 - 5.11 MIL/uL Final  . Hemoglobin 04/18/2013 11.1* 12.0 - 15.0 g/dL Final  . HCT 24/40/1027 33.4* 36.0 - 46.0 % Final  . MCV 04/18/2013 92.3  78.0 - 100.0 fL Final  . MCH 04/18/2013 30.7  26.0 - 34.0 pg Final  . MCHC 04/18/2013 33.2  30.0 - 36.0 g/dL Final  . RDW 25/36/6440 12.8  11.5 - 15.5 % Final  . Platelets 04/18/2013 224  150 - 400 K/uL Final  . Sodium 04/18/2013 137  135 - 145 mEq/L Final  . Potassium 04/18/2013 4.0  3.5 - 5.1 mEq/L Final   Comment: RESULT REPEATED AND VERIFIED                          DELTA CHECK NOTED                          NO VISIBLE HEMOLYSIS  . Chloride 04/18/2013 104  96 - 112 mEq/L Final  . CO2 04/18/2013 27  19 - 32 mEq/L Final  . Glucose, Bld 04/18/2013 156* 70 - 99 mg/dL Final  . BUN 34/74/2595 12  6 - 23 mg/dL Final  . Creatinine, Ser 04/18/2013 0.62  0.50 - 1.10 mg/dL Final  . Calcium 63/87/5643 9.0  8.4 - 10.5 mg/dL Final  . GFR calc non Af Amer 04/18/2013 79* >90 mL/min Final  . GFR calc Af Amer 04/18/2013 >90  >90 mL/min Final   Comment: (NOTE)  The eGFR has been calculated using the CKD EPI equation.                          This calculation has not been validated in all clinical situations.                          eGFR's persistently <90 mL/min signify possible Chronic Kidney                          Disease.  Marland Kitchen D-Dimer, Quant 04/18/2013 13.27* 0.00 - 0.48 ug/mL-FEU Final   Comment:                                 AT THE INHOUSE ESTABLISHED CUTOFF                          VALUE OF 0.48 ug/mL FEU,                          THIS ASSAY HAS BEEN DOCUMENTED                          IN THE LITERATURE TO HAVE                          A SENSITIVITY AND NEGATIVE                          PREDICTIVE VALUE OF AT LEAST                          98 TO 99%.  THE TEST RESULT                          SHOULD BE CORRELATED WITH                          AN ASSESSMENT OF THE CLINICAL                           PROBABILITY OF DVT / VTE.  Marland Kitchen Total CK 04/18/2013 75  7 - 177 U/L Final  . CK, MB 04/18/2013 3.0  0.3 - 4.0 ng/mL Final  . Relative Index 04/18/2013 RELATIVE INDEX IS INVALID  0.0 - 2.5 Final   Comment: WHEN CK < 100 U/L                                                          Performed at [facility]  . Troponin I 04/18/2013 <0.30  <0.30 ng/mL Final   Comment:                                 Due to the release kinetics of cTnI,  a negative result within the first hours                          of the onset of symptoms does not rule out                          myocardial infarction with certainty.                          If myocardial infarction is still suspected,                          repeat the test at appropriate intervals.  . WBC 04/19/2013 9.5  4.0 - 10.5 K/uL Final  . RBC 04/19/2013 3.16* 3.87 - 5.11 MIL/uL Final  . Hemoglobin 04/19/2013 9.5* 12.0 - 15.0 g/dL Final  . HCT 16/03/9603 29.1* 36.0 - 46.0 % Final  . MCV 04/19/2013 92.1  78.0 - 100.0 fL Final  . MCH 04/19/2013 30.1  26.0 - 34.0 pg Final  . MCHC 04/19/2013 32.6  30.0 - 36.0 g/dL Final  . RDW 54/01/8118 12.9  11.5 - 15.5 % Final  . Platelets 04/19/2013 185  150 - 400 K/uL Final  . Sodium 04/19/2013 134* 135 - 145 mEq/L Final  . Potassium 04/19/2013 3.8  3.5 - 5.1 mEq/L Final  . Chloride 04/19/2013 105  96 - 112 mEq/L Final  . CO2 04/19/2013 25  19 - 32 mEq/L Final  . Glucose, Bld 04/19/2013 165* 70 - 99 mg/dL Final  . BUN 14/78/2956 8  6 - 23 mg/dL Final  . Creatinine, Ser 04/19/2013 0.57  0.50 - 1.10 mg/dL Final  . Calcium 21/30/8657 8.7  8.4 - 10.5 mg/dL Final  . GFR calc non Af Amer 04/19/2013 82* >90 mL/min Final  . GFR calc Af Amer 04/19/2013 >90  >90 mL/min Final   Comment: (NOTE)                          The eGFR has been calculated using the CKD EPI equation.                          This calculation has not been validated in all clinical situations.                           eGFR's persistently <90 mL/min signify possible Chronic Kidney                          Disease.  . WBC 04/20/2013 8.7  4.0 - 10.5 K/uL Final  . RBC 04/20/2013 2.78* 3.87 - 5.11 MIL/uL Final  . Hemoglobin 04/20/2013 8.6* 12.0 - 15.0 g/dL Final  . HCT 84/69/6295 26.0* 36.0 - 46.0 % Final  . MCV 04/20/2013 93.5  78.0 - 100.0 fL Final  . MCH 04/20/2013 30.9  26.0 - 34.0 pg Final  . MCHC 04/20/2013 33.1  30.0 - 36.0 g/dL Final  . RDW 28/41/3244 13.1  11.5 - 15.5 % Final  . Platelets 04/20/2013 181  150 - 400 K/uL Final  . Sodium 04/20/2013 136  135 - 145 mEq/L Final  . Potassium 04/20/2013 4.0  3.5 - 5.1 mEq/L Final  . Chloride 04/20/2013 106  96 -  112 mEq/L Final  . CO2 04/20/2013 24  19 - 32 mEq/L Final  . Glucose, Bld 04/20/2013 91  70 - 99 mg/dL Final  . BUN 19/14/7829 11  6 - 23 mg/dL Final  . Creatinine, Ser 04/20/2013 0.59  0.50 - 1.10 mg/dL Final  . Calcium 56/21/3086 8.5  8.4 - 10.5 mg/dL Final  . GFR calc non Af Amer 04/20/2013 81* >90 mL/min Final  . GFR calc Af Amer 04/20/2013 >90  >90 mL/min Final   Comment: (NOTE)                          The eGFR has been calculated using the CKD EPI equation.                          This calculation has not been validated in all clinical situations.                          eGFR's persistently <90 mL/min signify possible Chronic Kidney                          Disease.  . Sodium 04/21/2013 137  135 - 145 mEq/L Final  . Potassium 04/21/2013 3.8  3.5 - 5.1 mEq/L Final  . Chloride 04/21/2013 105  96 - 112 mEq/L Final  . CO2 04/21/2013 29  19 - 32 mEq/L Final  . Glucose, Bld 04/21/2013 98  70 - 99 mg/dL Final  . BUN 57/84/6962 11  6 - 23 mg/dL Final  . Creatinine, Ser 04/21/2013 0.54  0.50 - 1.10 mg/dL Final  . Calcium 95/28/4132 8.2* 8.4 - 10.5 mg/dL Final  . GFR calc non Af Amer 04/21/2013 83* >90 mL/min Final  . GFR calc Af Amer 04/21/2013 >90  >90 mL/min Final   Comment: (NOTE)                          The eGFR has been  calculated using the CKD EPI equation.                          This calculation has not been validated in all clinical situations.                          eGFR's persistently <90 mL/min signify possible Chronic Kidney                          Disease.  . WBC 04/21/2013 8.4  4.0 - 10.5 K/uL Final  . RBC 04/21/2013 2.74* 3.87 - 5.11 MIL/uL Final  . Hemoglobin 04/21/2013 8.4* 12.0 - 15.0 g/dL Final  . HCT 44/06/270 25.4* 36.0 - 46.0 % Final  . MCV 04/21/2013 92.7  78.0 - 100.0 fL Final  . MCH 04/21/2013 30.7  26.0 - 34.0 pg Final  . MCHC 04/21/2013 33.1  30.0 - 36.0 g/dL Final  . RDW 53/66/4403 12.9  11.5 - 15.5 % Final  . Platelets 04/21/2013 206  150 - 400 K/uL Final  Hospital Outpatient Visit on 04/10/2013  Component Date Value Range Status  . MRSA, PCR 04/10/2013 NEGATIVE  NEGATIVE Final  . Staphylococcus aureus 04/10/2013 NEGATIVE  NEGATIVE Final   Comment:  The Xpert SA Assay (FDA                          approved for NASAL specimens                          in patients over 27 years of age),                          is one component of                          a comprehensive surveillance                          program.  Test performance has                          been validated by [employer] for patients greater                          than or equal to 44 year old.                          It is not intended                          to diagnose infection nor to                          guide or monitor treatment.  Marland Kitchen aPTT 04/10/2013 30  24 - 37 seconds Final  . WBC 04/10/2013 6.0  4.0 - 10.5 K/uL Final  . RBC 04/10/2013 5.07  3.87 - 5.11 MIL/uL Final  . Hemoglobin 04/10/2013 15.2* 12.0 - 15.0 g/dL Final  . HCT 40/98/1191 46.7* 36.0 - 46.0 % Final  . MCV 04/10/2013 92.1  78.0 - 100.0 fL Final  . MCH 04/10/2013 30.0  26.0 - 34.0 pg Final  . MCHC 04/10/2013 32.5  30.0 - 36.0 g/dL Final  . RDW 47/82/9562 12.9  11.5  - 15.5 % Final  . Platelets 04/10/2013 297  150 - 400 K/uL Final  . Sodium 04/10/2013 138  135 - 145 mEq/L Final  . Potassium 04/10/2013 3.6  3.5 - 5.1 mEq/L Final  . Chloride 04/10/2013 103  96 - 112 mEq/L Final  . CO2 04/10/2013 29  19 - 32 mEq/L Final  . Glucose, Bld 04/10/2013 83  70 - 99 mg/dL Final  . BUN 13/12/6576 11  6 - 23 mg/dL Final  . Creatinine, Ser 04/10/2013 0.67  0.50 - 1.10 mg/dL Final  . Calcium 46/96/2952 10.4  8.4 - 10.5 mg/dL Final  . Total Protein 04/10/2013 7.3  6.0 - 8.3 g/dL Final  . Albumin 84/13/2440 3.7  3.5 - 5.2 g/dL Final  . AST 03/28/2535 24  0 - 37 U/L Final  . ALT 04/10/2013 18  0 - 35 U/L Final  . Alkaline Phosphatase 04/10/2013 70  39 - 117 U/L Final  . Total Bilirubin 04/10/2013 0.4  0.3 - 1.2 mg/dL Final  .  GFR calc non Af Amer 04/10/2013 77* >90 mL/min Final  . GFR calc Af Amer 04/10/2013 90* >90 mL/min Final   Comment: (NOTE)                          The eGFR has been calculated using the CKD EPI equation.                          This calculation has not been validated in all clinical situations.                          eGFR's persistently <90 mL/min signify possible Chronic Kidney                          Disease.  Marland Kitchen Prothrombin Time 04/10/2013 12.4  11.6 - 15.2 seconds Final  . INR 04/10/2013 0.94  0.00 - 1.49 Final  . Color, Urine 04/10/2013 YELLOW  YELLOW Final  . APPearance 04/10/2013 CLEAR  CLEAR Final  . Specific Gravity, Urine 04/10/2013 1.013  1.005 - 1.030 Final  . pH 04/10/2013 7.0  5.0 - 8.0 Final  . Glucose, UA 04/10/2013 NEGATIVE  NEGATIVE mg/dL Final  . Hgb urine dipstick 04/10/2013 NEGATIVE  NEGATIVE Final  . Bilirubin Urine 04/10/2013 NEGATIVE  NEGATIVE Final  . Ketones, ur 04/10/2013 NEGATIVE  NEGATIVE mg/dL Final  . Protein, ur 16/03/9603 NEGATIVE  NEGATIVE mg/dL Final  . Urobilinogen, UA 04/10/2013 0.2  0.0 - 1.0 mg/dL Final  . Nitrite 54/01/8118 NEGATIVE  NEGATIVE Final  . Leukocytes, UA 04/10/2013 NEGATIVE  NEGATIVE  Final   MICROSCOPIC NOT DONE ON URINES WITH NEGATIVE PROTEIN, BLOOD, LEUKOCYTES, NITRITE, OR GLUCOSE <1000 mg/dL.  . ABO/RH(D) 04/10/2013 O POS   Final  . Antibody Screen 04/10/2013 NEG   Final  . Sample Expiration 04/10/2013 04/20/2013   Final  . ABO/RH(D) 04/10/2013 O POS   Final     X-Rays:Dg Chest 2 View  04/20/2013   CLINICAL DATA:  Post pacemaker placement and knee replacement.  EXAM: CHEST  2 VIEW  COMPARISON:  04/10/2013.  FINDINGS: There are low lung volumes. There is mild bilateral interstitial thickening. Is no focal consolidation or pneumothorax. There are small bilateral pleural effusions. The cardiomediastinal silhouette is normal. There is a dual lead cardiac pacer. There is an S-shaped curvature of the thoracolumbar spine.  IMPRESSION: Bilateral small pleural effusions and interstitial thickening concerning for mild interstitial edema.   Electronically Signed   By: Elige Ko   On: 04/20/2013 08:00   Dg Chest 2 View  04/10/2013   CLINICAL DATA:  Preop for knee replacement  EXAM: CHEST  2 VIEW  COMPARISON:  12/24/2011  FINDINGS: Cardiomediastinal silhouette is stable. No acute infiltrate or pleural effusion. No pulmonary edema. Lower thoracic levoscoliosis. Old left upper rib fractures. Thoracic spine osteopenia.  IMPRESSION: No active disease. Lower thoracic levoscoliosis.   Electronically Signed   By: Natasha Mead M.D.   On: 04/10/2013 13:48   Ct Angio Chest Pe W/cm &/or Wo Cm  04/18/2013   CLINICAL DATA:  Evaluate pulmonary embolism. Chest pain, burning in throat. History of gastroesophageal reflux disease. History of esophageal dilatation.  EXAM: CT ANGIOGRAPHY CHEST WITH CONTRAST  TECHNIQUE: Multidetector CT imaging of the chest was performed using the standard protocol during bolus administration of intravenous contrast. Multiplanar CT image reconstructions including MIPs were obtained to  evaluate the vascular anatomy.  CONTRAST:  OMNIPAQUE IOHEXOL 350 MG/ML SOLN   COMPARISON:  Chest radiograph April 10, 2013  FINDINGS: Main pulmonary artery is not enlarged, no pulmonary arterial filling defect to the level of the subsegmental branches.  Mild biapical pleural parenchymal scarring and trace bronchiectasis. Mild lung base pleural thickening, with mild bibasilar atelectasis and scarring. No solid pulmonary nodules or masses. 3 mm right upper lobe anterior segment ground-glass nodule, no surveillance recommendations.  Tracheobronchial tree is patent and midline. 5 mm pretracheal lymph node, no lymphadenopathy by CT size criteria. Subcarinal calcified lymph nodes. The heart and pericardium are unremarkable. Thoracic aorta is normal in course and caliber with mild intimal thickening calcific atherosclerosis.  Small hiatal hernia. Included view of the abdomen is unremarkable. Bilateral calcified breast implants in place. Patient is osteopenic with mild to moderate chronic T11 compression fracture.  Review of the MIP images confirms the above findings.  IMPRESSION: No pulmonary embolism.  Mild biapical pleural parenchymal scarring, with bibasilar atelectasis and pleural thickening.   Electronically Signed   By: Awilda Metro   On: 04/18/2013 06:55    EKG: Orders placed during the hospital encounter of 04/17/13  . EKG 12-LEAD  . EKG 12-LEAD  . EKG 12-LEAD  . EKG 12-LEAD  . EKG 12-LEAD  . EKG 12-LEAD  . EKG 12-LEAD  . EKG 12-LEAD  . EKG 12-LEAD  . EKG 12-LEAD  . EKG 12-LEAD  . EKG 12-LEAD  . EKG 12-LEAD  . EKG 12-LEAD  . EKG 12-LEAD  . EKG 12-LEAD  . EKG     Hospital Course: Shelly Ross is a 79 y.o. who was admitted to [facility].  She was taken to the operating room and underwent Right Total Knee Arthroplasty and a Left knee cortisone injection on 04/17/2013.   Patient tolerated the procedure well and was later transferred to the recovery room.  She was found to have bradycardia in the PACU.  Cardiology was consulted due to the  findings. Assessment/Plan  1 bradycardia-the patient apparently was noted to have heart rates in the high 30s in the PACU. I do not have the strips available. Electrocardiogram shows sinus rhythm, first degree AV block with no ST changes. It is unchanged compared to preoperative electrocardiogram. She is not having symptoms of chest pain, dizziness or dyspnea. I agree with following on telemetry. At present there is no indication for pacemaker. Note she has had occasional brief dizzy spells at home. Etiology unclear. Following discharge it would be worthwhile to have an event monitor placed to make sure these are not bradycardia mediated.  2 status post right total knee arthroplasty-management per orthopedic surgery.  3 hypertension-would continue preadmission medications. Avoid AV nodal blocking agents.  Olga Millers MD She was then to the telemetry for close monitoring and postoperative care.  They were given PO and IV analgesics for pain control following their surgery.  They were given 24 hours of postoperative antibiotics of  Anti-infectives   Start     Dose/Rate Route Frequency Ordered Stop   04/19/13 1700  ceFAZolin (ANCEF) IVPB 1 g/50 mL premix     1 g 100 mL/hr over 30 Minutes Intravenous Every 6 hours 04/19/13 1535 04/20/13 0627   04/19/13 0600  gentamicin (GARAMYCIN) 80 mg in sodium chloride irrigation 0.9 % 500 mL irrigation  Status:  Discontinued     80 mg Irrigation On call 04/18/13 1407 04/19/13 1535   04/19/13 0000  ceFAZolin (ANCEF) IVPB 2 g/50  mL premix  Status:  Discontinued     2 g 100 mL/hr over 30 Minutes Intravenous 30 min pre-op 04/18/13 1407 04/19/13 1535   04/17/13 1400  ceFAZolin (ANCEF) IVPB 1 g/50 mL premix     1 g 100 mL/hr over 30 Minutes Intravenous Every 6 hours 04/17/13 1158 04/17/13 2042   04/17/13 0615  ceFAZolin (ANCEF) IVPB 2 g/50 mL premix     2 g 100 mL/hr over 30 Minutes Intravenous On call to O.R. 04/17/13 0604 04/17/13 0730     and started on DVT  prophylaxis in the form of Xarelto.   PT and OT were ordered but her therapu was initially held due to the bradycardia.  Patient's HR has continued to drop into the 30s-40s and sustained for 10 minutes. Called cardiologist on call, Dr. Mayford Knife, who reviewed patient's previous EKGs and said she saw a 2nd degree type 1 block. Patient's other vitals remain stable. Patient's chest pain still persists, but is slightly decreased after dilaudid given. Dr. Mayford Knife decided to transfer patient to stepdown for closer monitoring.  POD 1 - Patient had a rough night on the evening of surgery. Found to have bradycardia psotop in PACU. Cardiology consult was called.  Transferred to stepdown the night before for closer monitoring.  Elevated D-Dimer and then CT Angio which was negative for PE by report. RN stated rough night and no rest.  Patient seen in rounds with Dr. Lequita Halt. She was sitting up in bed. She stated to Dr. Lequita Halt that she has had episodes of weakness that typically resolved with rest. May have had some ongoing episodes of bradycardia for a little while. Cardiology already mentioned event monitor. On tele.  EP was consulted: EKG: nsr with AV WB block  TELEMETRY: NSR with AVWB block and intermittant CHB  A/P  1. AVWB block and intermittant CHB with at times a wide ventricular escape  2. Chronic dizziness  3. S/p knee replacement  4. Severe arthritis  Rec: While her chronic dizziness is not likely to be due to bradycardia, she had clear intermittent complete heart block with a wide ventricular escape at 4 this morning. I asked the patient and she stated that she was awake. Since then she has had 2:1 AV block. Careful review of her history also suggests that she has had episodes where she fell out without warning. She is a bit of a difficult historian however. With all of the above, I would suggest that she undergo PPM insertion. Will attempt to schedule this tomorrow. She would be ready for DC on Thursday  from cardiac perspective.  Sharlot Gowda Taylor,M.D 2D Echocardiogram has been performed. POD 2 - Patient seen in rounds with Dr. Lequita Halt. Sitting up in bed on rounds. Pulse was in 70's and 80's during visit.  Patient was better, but has had some minor complaints of pain in the knee, requiring pain medications.  Planned for pacemaker. ASSESSMENT AND PLAN:  1. Bradycardia/AV block: Sinus bradycardia on telemetry with high grade AV block, complete heart block. She has been seen by EP and plans in place for permanent pacemaker today. She is hemodynamically stable at this time. Echo today to assess LV function.  2. S/p right total knee arthroplasty: Management per orthopedic surgery.  3. Chest pain: Resolved. NO signs of ischemia. CTA negative for PE. Cardiac markers negative. Pepcid helped with chest pain. Likely GERD.  MCALHANY,CHRISTOPHER They were brought to the operating room on 04/09/2013 and underwent Procedure(s): PERMANENT PACEMAKER INSERTION. CONCLUSIONS:  1. Successful  implantation of a [employer]onic Adapta L dual-chamber pacemaker for symptomatic sinus bradycardia and mobitz II second degree AV block with syncope  2. No early apparent complications.  Hillis Range, MD POD 3 - Patient reports pain as moderate. Patient was seen in rounds for Dr. Lequita Halt.  Patient was having problems with pain in the knee, requiring pain medications and also slow to get up with therapy. She has been in the bed since surgery. Spoke with the cardiology team and okay for rehab from their standpoint.  Plan was to go Skilled nursing facility after hospital stay.  Weight-Bearing as tolerated to right leg.  Unable to do SLR yet. Will place back into Knee immobilizer only for walking until able to perform SLRs Discharge planning consulted to help with postop disposition and equipment needs.  Continued to work with therapy.  Dressing was changed on day two and the incision was healing well.  By day three, patient was seen in rounds  and was stable from a cardiac standpoint but needed more therapy and felt to be a good candidate for the SNF.   Discharge Medications: Prior to Admission medications   Medication Sig Start Date End Date Taking? Authorizing Provider  hydrochlorothiazide (HYDRODIURIL) 25 MG tablet Take 12.5 mg by mouth daily as needed (Only if BP BOTTOM NUMBER IS  over 85).    Yes Historical Provider, MD  levothyroxine (SYNTHROID, LEVOTHROID) 75 MCG tablet Take 37.5 mcg by mouth daily before breakfast.  09/21/12  Yes Pecola Lawless, MD  acetaminophen (TYLENOL) 325 MG tablet Take 1-2 tablets (325-650 mg total) by mouth every 4 (four) hours as needed for mild pain. 04/21/13   Alexzandrew Perkins, PA-C  alum & mag hydroxide-simeth (MAALOX/MYLANTA) 200-200-20 MG/5ML suspension Take 30 mLs by mouth every 6 (six) hours as needed for indigestion or heartburn. 04/21/13   Alexzandrew Julien Girt, PA-C  antiseptic oral rinse (BIOTENE) LIQD 15 mLs by Mouth Rinse route 2 (two) times daily. 04/21/13   Alexzandrew Perkins, PA-C  bisacodyl (DULCOLAX) 10 MG suppository Place 1 suppository (10 mg total) rectally daily as needed for moderate constipation. 04/21/13   Alexzandrew Perkins, PA-C  docusate sodium 100 MG CAPS Take 100 mg by mouth 2 (two) times daily. 04/21/13   Alexzandrew Perkins, PA-C  famotidine (PEPCID) 20 MG tablet Take 1 tablet (20 mg total) by mouth daily. 04/21/13   Alexzandrew Perkins, PA-C  HYDROmorphone (DILAUDID) 2 MG tablet Take 1 tablet (2 mg total) by mouth every 4 (four) hours as needed for severe pain. 04/21/13   Alexzandrew Julien Girt, PA-C  iron polysaccharides (NIFEREX) 150 MG capsule Take 1 capsule (150 mg total) by mouth 2 (two) times daily. 04/21/13   Alexzandrew Perkins, PA-C  methocarbamol (ROBAXIN) 500 MG tablet Take 1 tablet (500 mg total) by mouth every 6 (six) hours as needed for muscle spasms. 04/21/13   Alexzandrew Perkins, PA-C  ondansetron (ZOFRAN) 4 MG tablet Take 1 tablet (4 mg total) by mouth  every 6 (six) hours as needed for nausea. 04/21/13   Alexzandrew Perkins, PA-C  polyethylene glycol (MIRALAX / GLYCOLAX) packet Take 17 g by mouth daily as needed for mild constipation. 04/21/13   Alexzandrew Julien Girt, PA-C  rivaroxaban (XARELTO) 10 MG TABS tablet Take 1 tablet (10 mg total) by mouth daily. Take Xarelto for two and a half more weeks, then discontinue Xarelto. Once the patient has completed the Xarelto, they may resume the 81 mg Aspirin. 04/21/13   Alexzandrew Perkins, PA-C  traMADol (ULTRAM) 50 MG tablet Take 1-2 tablets (  50-100 mg total) by mouth every 6 (six) hours as needed (mild pain). 04/21/13   Alexzandrew Julien Girt, PA-C   Discharge to SNF - [facility]  Diet - Cardiac diet  Follow up - in 2 weeks  Activity - WBAT  Disposition - Skilled nursing facility - Skilled unit at [facility]  Condition Upon Discharge - pending at this time of summary. D/C Meds - See DC Summary  DVT Prophylaxis - Xarelto       Discharge Orders   Future Appointments Provider Department Dept Phone   05/04/2013 2:30 PM [facility] [place] Office [phone]   07/21/2013 10:30 AM Hillis Range, MD [facility] [phone]   Future Orders Complete By Expires   Call MD / Call 911  As directed    Comments:     If you experience chest pain or shortness of breath, CALL 911 and be transported to the hospital emergency room.  If you develope a fever above 101 F, pus (white drainage) or increased drainage or redness at the wound, or calf pain, call your surgeon's office.   Change dressing  As directed    Comments:     Change dressing daily with sterile 4 x 4 inch gauze dressing and apply TED hose. Do not submerge the incision under water.   Constipation Prevention  As directed    Comments:     Drink plenty of fluids.  Prune juice may be helpful.  You may use a stool softener, such as Colace (over the counter) 100 mg twice a day.  Use MiraLax (over the  counter) for constipation as needed.   Diet - low sodium heart healthy  As directed    Discharge instructions  As directed    Comments:     Pick up stool softner and laxative for home. Do not submerge incision under water. May shower. Continue to use ice for pain and swelling from surgery.  Take Xarelto for two and a half more weeks, then discontinue Xarelto. Once the patient has completed the blood thinner regimen, then take a Baby 81 mg Aspirin daily for four more weeks.   Do not put a pillow under the knee. Place it under the heel.  As directed    Do not sit on low chairs, stoools or toilet seats, as it may be difficult to get up from low surfaces  As directed    Driving restrictions  As directed    Comments:     No driving until released by the physician.   Increase activity slowly as tolerated  As directed    Lifting restrictions  As directed    Comments:     No lifting until released by the physician.   Patient may shower  As directed    Comments:     You may shower without a dressing once there is no drainage.  Do not wash over the wound.  If drainage remains, do not shower until drainage stops.   TED hose  As directed    Comments:     Use stockings (TED hose) for 3 weeks on both leg(s).  You may remove them at night for sleeping.   Weight bearing as tolerated  As directed    Questions:     Laterality:     Extremity:         Medication List    STOP taking these medications       acidophilus Caps capsule  BENEFIBER DRINK MIX PO      TAKE these medications       acetaminophen 325 MG tablet  Commonly known as:  TYLENOL  Take 1-2 tablets (325-650 mg total) by mouth every 4 (four) hours as needed for mild pain.     alum & mag hydroxide-simeth 200-200-20 MG/5ML suspension  Commonly known as:  MAALOX/MYLANTA  Take 30 mLs by mouth every 6 (six) hours as needed for indigestion or heartburn.     antiseptic oral rinse Liqd  15 mLs by Mouth Rinse route 2 (two) times  daily.     bisacodyl 10 MG suppository  Commonly known as:  DULCOLAX  Place 1 suppository (10 mg total) rectally daily as needed for moderate constipation.     DSS 100 MG Caps  Take 100 mg by mouth 2 (two) times daily.     famotidine 20 MG tablet  Commonly known as:  PEPCID  Take 1 tablet (20 mg total) by mouth daily.     hydrochlorothiazide 25 MG tablet  Commonly known as:  HYDRODIURIL  Take 12.5 mg by mouth daily as needed (Only if BP BOTTOM NUMBER IS  over 85).     HYDROmorphone 2 MG tablet  Commonly known as:  DILAUDID  Take 1 tablet (2 mg total) by mouth every 4 (four) hours as needed for severe pain.     iron polysaccharides 150 MG capsule  Commonly known as:  NIFEREX  Take 1 capsule (150 mg total) by mouth 2 (two) times daily.     levothyroxine 75 MCG tablet  Commonly known as:  SYNTHROID, LEVOTHROID  Take 37.5 mcg by mouth daily before breakfast.     methocarbamol 500 MG tablet  Commonly known as:  ROBAXIN  Take 1 tablet (500 mg total) by mouth every 6 (six) hours as needed for muscle spasms.     ondansetron 4 MG tablet  Commonly known as:  ZOFRAN  Take 1 tablet (4 mg total) by mouth every 6 (six) hours as needed for nausea.     polyethylene glycol packet  Commonly known as:  MIRALAX / GLYCOLAX  Take 17 g by mouth daily as needed for mild constipation.     rivaroxaban 10 MG Tabs tablet  Commonly known as:  XARELTO  - Take 1 tablet (10 mg total) by mouth daily. Take Xarelto for two and a half more weeks, then discontinue Xarelto.  - Once the patient has completed the Xarelto, they may resume the 81 mg Aspirin.     traMADol 50 MG tablet  Commonly known as:  ULTRAM  Take 1-2 tablets (50-100 mg total) by mouth every 6 (six) hours as needed (mild pain).       Follow-up Information   Follow up with [facility] On 05/04/2013. (At 2:30 PM for wound check)    Specialty:  Cardiology   Contact information:   [address] [place] [address] [phone]      Follow up with Hillis Range, MD On 07/21/2013. (At 10:30 AM)    Specialty:  Cardiology   Contact information:   [address] [place] [address] [phone]       Follow up with Loanne Drilling, MD. Schedule an appointment as soon as possible for a visit in 2 weeks.   Specialty:  Orthopedic Surgery   Contact information:   [address] [place] [address] [phone]       Signed: Julien Girt,  ALEXZANDREW 04/21/2013, 10:22 AM

## 2013-04-21 NOTE — Care Management Note (Signed)
    Page 1 of 2   04/21/2013     11:29:49 AM   CARE MANAGEMENT NOTE 04/21/2013  Patient:  Shelly Ross, Shelly Ross   Account Number:  [id number]  Date Initiated:  04/18/2013  Documentation initiated by:  DAVIS,RHONDA  Subjective/Objective Assessment:   pt s/p tkr, complete heart block with severe bradycardia post op. transferred to sdu for monitoring, source of brady-organic versus drug??     Action/Plan:   tbd home with hhc versus rehab, plan is including insertion of pacemaker   Anticipated DC Date:  04/21/2013   Anticipated DC Plan:  HOME W HOME HEALTH SERVICES  In-house referral  NA      DC Planning Services  NA      [facility] Choice  NA   Choice offered to / List presented to:  NA   DME arranged  NA      DME agency  NA     HH arranged  NA      HH agency  NA   Status of service:  Completed, signed off Medicare Important Message given?  NA - LOS <3 / Initial given by admissions (If response is "NO", the following Medicare IM given date fields will be blank) Date Medicare IM given:   Date Additional Medicare IM given:    Discharge Disposition:  SKILLED NURSING FACILITY  Per UR Regulation:  Reviewed for med. necessity/level of care/duration of stay  If discussed at Long Length of Stay Meetings, dates discussed:    Comments:  04-21-13 [address] Tomi Bamberger, RN,BSN [phone] Pt planned for d/c today to SNF. No further needs from CM at this time.    [id number] Earlene Plater, RN, BSN, [place] [phone] Chart Reviewed for discharge and hospital needs. Discharge needs at time of review:  None present will follow for needs. Review of patient progress due on [id number].

## 2013-04-21 NOTE — Progress Notes (Signed)
Physical Therapy Treatment Patient Details Name: Shelly Ross MRN: [id number] DOB: Nov 07, 1924 Today's Date: 04/21/2013 Time: 7829-5621 PT Time Calculation (min): 17 min  PT Assessment / Plan / Recommendation  History of Present Illness Pt is a 79 year old female with a past medical history significant for hypertension, hyperlipidemia, hypothyroidism, AVNRT (s/p slow pathway modification 2005), and chronic dizziness.  She was admitted yesterday and underwent right total knee arthroscopy and left knee cortisone injection.  In the PACU, she was noted to have periods of bradycardia and has been monitored on telemetry.  Telemetry has demonstrated intermittent complete heart block with rates into the 30's and a wide ventricular escape at times.  Pacemaker place 11/19   PT Comments   Patient able to perform exercises with verbal instructions and min assist.  Strength improving.  Follow Up Recommendations  SNF     Does the patient have the potential to tolerate intense rehabilitation     Barriers to Discharge        Equipment Recommendations  None recommended by PT    Recommendations for Other Services    Frequency 7X/week   Progress towards PT Goals Progress towards PT goals: Progressing toward goals  Plan Current plan remains appropriate    Precautions / Restrictions Precautions Precautions: Knee;Fall;ICD/Pacemaker Precaution Comments: Pacemaker placed 11/20, Lt. UE pacemaker restrictions Required Braces or Orthoses: Knee Immobilizer - Right Knee Immobilizer - Right: On when out of bed or walking Restrictions Weight Bearing Restrictions: Yes RLE Weight Bearing: Weight bearing as tolerated   Pertinent Vitals/Pain     Mobility  Bed Mobility Bed Mobility: Not assessed   Exercises Total Joint Exercises Ankle Circles/Pumps: AROM;Both;10 reps;Supine Quad Sets: AROM;Right;10 reps;Supine Short Arc Quad: AROM;Right;10 reps;Supine Heel Slides: AAROM;Right;10 reps;Supine Hip  ABduction/ADduction: AAROM;Right;10 reps;Supine   PT Diagnosis:    PT Problem List:   PT Treatment Interventions:     PT Goals (current goals can now be found in the care plan section)    Visit Information  Last PT Received On: 04/21/13 Assistance Needed: +2 History of Present Illness: Pt is a 79 year old female with a past medical history significant for hypertension, hyperlipidemia, hypothyroidism, AVNRT (s/p slow pathway modification 2005), and chronic dizziness.  She was admitted yesterday and underwent right total knee arthroscopy and left knee cortisone injection.  In the PACU, she was noted to have periods of bradycardia and has been monitored on telemetry.  Telemetry has demonstrated intermittent complete heart block with rates into the 30's and a wide ventricular escape at times.  Pacemaker place 11/19    Subjective Data  Subjective: My knee feels good just laying here   Cognition  Cognition Arousal/Alertness: Awake/alert Behavior During Therapy: WFL for tasks assessed/performed Overall Cognitive Status: Within Functional Limits for tasks assessed    Balance  Static Standing Balance Static Standing - Balance Support: Bilateral upper extremity supported Static Standing - Level of Assistance: 4: Min assist Static Standing - Comment/# of Minutes: 3 minutes to adjust KI on RLE.  Patient leaning posteriorly  End of Session PT - End of Session Equipment Utilized During Treatment: Gait belt Activity Tolerance: Patient tolerated treatment well Patient left: in bed;with call bell/phone within reach;with family/visitor present Nurse Communication: Mobility status   GP     Shelly Ross 04/21/2013, 3:20 PM Durenda Hurt. Renaldo Fiddler, [facility] Acute Rehab Services Pager [phone]

## 2013-04-21 NOTE — Progress Notes (Signed)
CSW (Clinical Child psychotherapist) called facility and insurance company to notify of dc.  Mischele Detter, LCSWA [phone]

## 2013-04-21 NOTE — Progress Notes (Signed)
CSW (Clinical Child psychotherapist) prepared pt dc packet and placed with shadow chart. CSW spoke with pt and confirmed non-emergent ambulance transport will be necessary to get pt to facility. Pt nurse to call and inform CSW of when pt dc orders are in.  Monaye Blackie, LCSWA [phone]

## 2013-04-21 NOTE — Progress Notes (Signed)
Orthopedic Tech Progress Note Patient Details:  [name] Oct 19, 1924 [id number] On cpm at 3:20 pm RLE 0-30 Patient ID: [name], female   DOB: 01/29/1925, 79 y.o.   MRN: [id number]   [name] 04/21/2013, 3:27 PM

## 2013-04-22 NOTE — Progress Notes (Signed)
   Subjective: 3 Days Post-Op Procedure(s) (LRB): PERMANENT PACEMAKER INSERTION (N/A)   Patient reports pain as mild, pain well controlled. No events throughout the night. Working slowly with PT. No events throughout the night.   Objective:   VITALS:   Filed Vitals:   04/22/13 0538  BP: 145/75  Pulse: 83  Temp: 98.4 F (36.9 C)  Resp: 18    Neurovascular intact Dorsiflexion/Plantar flexion intact Incision: dressing C/D/I No cellulitis present Compartment soft  LABS  Recent Labs  04/20/13 0417 04/21/13 0550  HGB 8.6* 8.4*  HCT 26.0* 25.4*  WBC 8.7 8.4  PLT 181 206     Recent Labs  04/20/13 0417 04/21/13 0550  NA 136 137  K 4.0 3.8  BUN 11 11  CREATININE 0.59 0.54  GLUCOSE 91 98     Assessment/Plan: 3 Days Post-Op Procedure(s) (LRB): PERMANENT PACEMAKER INSERTION (N/A) Up with therapy Discharge to SNF Follow up in 2 weeks at [facility]. Follow up with Dr. Lequita Halt in 2 weeks.  Contact information:  [facility] [address] [place] [address] [phone]        Anastasio Auerbach. Colbi Schiltz   PAC  04/22/2013, 8:45 AM

## 2013-04-22 NOTE — Progress Notes (Signed)
Physical Therapy Treatment Patient Details Name: Shelly Ross MRN: [id number] DOB: [birth year] Today's Date: 04/22/2013 Time: 4098-1191 PT Time Calculation (min): 11 min  PT Assessment / Plan / Recommendation  History of Present Illness Pt is a 79 year old female with a past medical history significant for hypertension, hyperlipidemia, hypothyroidism, AVNRT (s/p slow pathway modification 2005), and chronic dizziness.  She was admitted yesterday and underwent right total knee arthroscopy and left knee cortisone injection.  In the PACU, she was noted to have periods of bradycardia and has been monitored on telemetry.  Telemetry has demonstrated intermittent complete heart block with rates into the 30's and a wide ventricular escape at times.  Pacemaker place 11/19   PT Comments   Pt agreeable to bed level exercises this morning. Pt hopeful to D/C to [facility] today. Will require 24/7 (A) and post acute rehab upon D/C.  Will cont to follow till D/C.   Follow Up Recommendations  SNF     Does the patient have the potential to tolerate intense rehabilitation     Barriers to Discharge        Equipment Recommendations  None recommended by PT    Recommendations for Other Services    Frequency 7X/week   Progress towards PT Goals Progress towards PT goals: Progressing toward goals  Plan Current plan remains appropriate    Precautions / Restrictions Precautions Precautions: Knee;Fall;ICD/Pacemaker Precaution Comments: Pacemaker placed 11/20, Lt. UE pacemaker restrictions Required Braces or Orthoses: Knee Immobilizer - Right Knee Immobilizer - Right: On when out of bed or walking Restrictions Weight Bearing Restrictions: Yes RLE Weight Bearing: Weight bearing as tolerated   Pertinent Vitals/Pain 4/10; patient repositioned for comfort     Mobility  Bed Mobility Bed Mobility: Not assessed Details for Bed Mobility Assistance: performed bed level exercises  Transfers Transfers: Not  assessed Ambulation/Gait Ambulation/Gait Assistance: Not tested (comment) Stairs: No Wheelchair Mobility Wheelchair Mobility: No    Exercises Total Joint Exercises Ankle Circles/Pumps: AROM;Both;Supine;15 reps Quad Sets: AROM;Right;10 reps;Supine;Other (comment) (cues for proper muscle isolation) Short Arc Quad: AROM;Right;10 reps;Supine Heel Slides: AAROM;Right;10 reps;Supine;Other (comment) (limited by pain ) Hip ABduction/ADduction: AAROM;Right;10 reps;Supine Goniometric ROM: lacking ~ 5 degrees of Rt knee extension with ankle supported; limited to 30 degrees knee flexion in supine    PT Diagnosis:    PT Problem List:   PT Treatment Interventions:     PT Goals (current goals can now be found in the care plan section) Acute Rehab PT Goals Patient Stated Goal: none specified PT Goal Formulation: With patient Time For Goal Achievement: 05/02/13 Potential to Achieve Goals: Good  Visit Information  Last PT Received On: 04/22/13 Assistance Needed: +2 History of Present Illness: Pt is a 79 year old female with a past medical history significant for hypertension, hyperlipidemia, hypothyroidism, AVNRT (s/p slow pathway modification 2005), and chronic dizziness.  She was admitted yesterday and underwent right total knee arthroscopy and left knee cortisone injection.  In the PACU, she was noted to have periods of bradycardia and has been monitored on telemetry.  Telemetry has demonstrated intermittent complete heart block with rates into the 30's and a wide ventricular escape at times.  Pacemaker place 11/19    Subjective Data  Subjective: "i should be going today. ive been up to the bathroom but used the bedpan today"  Patient Stated Goal: none specified   Cognition  Cognition Arousal/Alertness: Awake/alert Behavior During Therapy: WFL for tasks assessed/performed Overall Cognitive Status: Within Functional Limits for tasks assessed  Balance     End of Session PT - End of  Session Activity Tolerance: Patient tolerated treatment well Patient left: in bed;with call bell/phone within reach;with family/visitor present Nurse Communication: Mobility status   GP     Donell Sievert, [place] [phone] 04/22/2013, 8:46 AM

## 2013-04-23 NOTE — Progress Notes (Signed)
Clinical Social Work Department CLINICAL SOCIAL WORK PLACEMENT NOTE 04/23/2013  Patient:  Shelly Ross, Shelly Ross  Account Number:  [id number] Admit date:  04/17/2013  Clinical Social Worker:  Harless Nakayama  Date/time:  04/20/2013 08:50 AM  Clinical Social Work is seeking post-discharge placement for this patient at the following level of care:   SKILLED NURSING   (*CSW will update this form in Epic as items are completed)   04/20/2013  Patient/family provided with [facility] Department of Clinical Social Work's list of facilities offering this level of care within the geographic area requested by the patient (or if unable, by the patient's family).  04/20/2013  Patient/family informed of their freedom to choose among providers that offer the needed level of care, that participate in Medicare, Medicaid or managed care program needed by the patient, have an available bed and are willing to accept the patient.  04/20/2013  Patient/family informed of [facility]' ownership interest in [facility], as well as of the fact that they are under no obligation to receive care at this facility.  PASARR submitted to EDS on 04/20/2013 PASARR number received from EDS on 04/20/2013  FL2 transmitted to all facilities in geographic area requested by pt/family on  04/20/2013 FL2 transmitted to all facilities within larger geographic area on   Patient informed that his/her managed care company has contracts with or will negotiate with  certain facilities, including the following:     Patient/family informed of bed offers received:   Patient chooses bed at  Physician recommends and patient chooses bed at    Patient to be transferred to [facility] on  04/22/2013 Patient to be transferred to facility by [facility]  The following physician request were entered in Epic:   Additional Comments:

## 2013-04-28 ENCOUNTER — Emergency Department (HOSPITAL_COMMUNITY): Payer: Medicare Other

## 2013-04-28 ENCOUNTER — Emergency Department (HOSPITAL_COMMUNITY)
Admission: EM | Admit: 2013-04-28 | Discharge: 2013-04-28 | Disposition: A | Discharge status: Skilled Nursing Facility | Payer: Medicare Other | Attending: Emergency Medicine | Admitting: Emergency Medicine

## 2013-04-28 ENCOUNTER — Encounter (HOSPITAL_COMMUNITY): Payer: Self-pay | Admitting: Emergency Medicine

## 2013-04-28 DIAGNOSIS — Z860100 Personal history of colon polyps, unspecified: Secondary | ICD-10-CM | POA: Insufficient documentation

## 2013-04-28 DIAGNOSIS — I83893 Varicose veins of bilateral lower extremities with other complications: Secondary | ICD-10-CM

## 2013-04-28 DIAGNOSIS — G8929 Other chronic pain: Secondary | ICD-10-CM | POA: Insufficient documentation

## 2013-04-28 DIAGNOSIS — Z8781 Personal history of (healed) traumatic fracture: Secondary | ICD-10-CM | POA: Insufficient documentation

## 2013-04-28 DIAGNOSIS — M25569 Pain in unspecified knee: Secondary | ICD-10-CM | POA: Insufficient documentation

## 2013-04-28 DIAGNOSIS — Z8619 Personal history of other infectious and parasitic diseases: Secondary | ICD-10-CM | POA: Insufficient documentation

## 2013-04-28 DIAGNOSIS — R079 Chest pain, unspecified: Secondary | ICD-10-CM

## 2013-04-28 DIAGNOSIS — M810 Age-related osteoporosis without current pathological fracture: Secondary | ICD-10-CM | POA: Insufficient documentation

## 2013-04-28 DIAGNOSIS — E785 Hyperlipidemia, unspecified: Secondary | ICD-10-CM | POA: Insufficient documentation

## 2013-04-28 DIAGNOSIS — M179 Osteoarthritis of knee, unspecified: Secondary | ICD-10-CM

## 2013-04-28 DIAGNOSIS — M5030 Other cervical disc degeneration, unspecified cervical region: Secondary | ICD-10-CM | POA: Insufficient documentation

## 2013-04-28 DIAGNOSIS — E039 Hypothyroidism, unspecified: Secondary | ICD-10-CM | POA: Insufficient documentation

## 2013-04-28 DIAGNOSIS — R001 Bradycardia, unspecified: Secondary | ICD-10-CM

## 2013-04-28 DIAGNOSIS — G8918 Other acute postprocedural pain: Secondary | ICD-10-CM

## 2013-04-28 DIAGNOSIS — M25561 Pain in right knee: Secondary | ICD-10-CM

## 2013-04-28 DIAGNOSIS — I10 Essential (primary) hypertension: Secondary | ICD-10-CM | POA: Insufficient documentation

## 2013-04-28 DIAGNOSIS — M79609 Pain in unspecified limb: Secondary | ICD-10-CM

## 2013-04-28 DIAGNOSIS — K219 Gastro-esophageal reflux disease without esophagitis: Secondary | ICD-10-CM | POA: Insufficient documentation

## 2013-04-28 DIAGNOSIS — M1710 Unilateral primary osteoarthritis, unspecified knee: Secondary | ICD-10-CM

## 2013-04-28 DIAGNOSIS — Z96659 Presence of unspecified artificial knee joint: Secondary | ICD-10-CM | POA: Insufficient documentation

## 2013-04-28 DIAGNOSIS — Z79899 Other long term (current) drug therapy: Secondary | ICD-10-CM | POA: Insufficient documentation

## 2013-04-28 DIAGNOSIS — Z950 Presence of cardiac pacemaker: Secondary | ICD-10-CM | POA: Insufficient documentation

## 2013-04-28 MED ORDER — HYDROCODONE-ACETAMINOPHEN 5-325 MG PO TABS: 1.0000 | ORAL_TABLET | ORAL | Status: DC | PRN

## 2013-04-28 NOTE — ED Notes (Signed)
Bed: WA09 Expected date:  Expected time:  Means of arrival:  Comments: Post op swelling to knee

## 2013-04-28 NOTE — ED Notes (Signed)
Pt from [facility]. Pt had R knee replacement 11/19. Pt reports increasing knee pain since last night. Pt able to complete physical therapy today okay. Staff member who changed bandages on knee reported knee looked more swollen than they expected for this point in healing. Bruising noted to R knee and lower leg.

## 2013-04-28 NOTE — ED Notes (Signed)
Vascular US at bedside.

## 2013-04-28 NOTE — Progress Notes (Signed)
Right lower extremity venous duplex completed.  Right:  No evidence of DVT, superficial thrombosis, or Baker's cyst.  Left:  Negative for DVT in the common femoral vein.  

## 2013-04-28 NOTE — ED Provider Notes (Signed)
CSN: [id number]     Arrival date & time 04/28/13  1238 History   First MD Initiated Contact with Patient 04/28/13 1255     Chief Complaint  Patient presents with  . Knee Pain    HPI  Shelly Ross is a 79 y.o. female with a PMH of SVT s/p ablation 2005, hepatitis, headaches, hypothyroidism, PVC's, stricture and stenosis of esophagus, GERD, HLD, thyroid disease, DDD, HTN and chronic lower back pain who presents to the ED for evaluation of knee pain.  History was provided by the patient her her daughter and son.  Patient presents to the emergency department complaining of right knee pain. She had a total knee arthroplasty on 04/17/13 with Dr. Lequita Halt.  She was found to be bradycardic after surgery. She was consulted by cardiology who found that she had sinus bradycardia with a high-grade AV block and a Medtronic dual-chamber Adapta pacemaker was placed.  She was discharged on 04/21/13 to a skilled nursing facility where she has been undergoing rehabilitation [facility]).  Patient states she has had intermittent pain which she has been taking tramadol for.   She states that her pain became more severe last night. She also was having worsening swelling to the right knee. She had a fever 5 days ago which has resolved. It is unclear if her temperature was 100.1 oral 101 (per daughter).  She denies any drainage or redness from the wound site. She has been undergoing physical therapy without difficulty. She denies any injuries or recent trauma. She otherwise been well with no chest pain, shortness of breath, abdominal pain, nausea, vomiting, diarrhea, constipation, numbness, tingling, loss of sensation, or weakness. She denies any history of blood clot in the past.    Past Medical History  Diagnosis Date  . Chronic low back pain   . Dizziness     some  . Hypertension   . Neuralgia, neuritis, and radiculitis, unspecified   . Degenerative disc disease     CERVICAL SPINE,W/RADICULOPATHY  . GERD  (gastroesophageal reflux disease)   . Hyperlipidemia   . Thyroid disease     HYPOTHYROIDISM  . Osteoporosis   . Rib fractures   . Stricture and stenosis of esophagus 2007  . Adenomatous colon polyp 2003  . Premature ventricular contractions 2006  . Hypothyroidism   . Headache(784.0)     not migraines, but a lot of headaches  . Hepatitis     jaundice as child  . SVT (supraventricular tachycardia)     S/P ablation 2005   Past Surgical History  Procedure Laterality Date  . Abdominal hysterectomy      WITH OOPHORECTOMY,? BILATERAL FOR ENDOMETRIOSIS  . Breast enhancement surgery    . G2 p2    . Bladder tacking    . Endovenous ablation saphenous vein w/ laser  07-13-2011    right greater saphenous vein, Dr Jerilee Field  . Endovenous ablation saphenous vein w/ laser  08/2011    L ; Dr Hart Rochester  . Cataract extraction Bilateral   . Esophageal dilation       X 1  . Colonoscopy    . Cardiac electrophysiology mapping and ablation    . Total knee arthroplasty Right 04/17/2013    Procedure: RIGHT TOTAL KNEE ARTHROPLASTY, CORTISONE INJECTION LEFT KNEE;  Surgeon: Loanne Drilling, MD;  Location: [facility];  Service: Orthopedics;  Laterality: Right;   Family History  Problem Relation Age of Onset  . Stroke Mother 73  . Heart attack Father  in 40s  . Deep vein thrombosis Brother     AND PTE  . Diabetes Brother   . Cancer Paternal Aunt      INTESTINAL   . Alzheimer's disease Sister   . Alzheimer's disease Brother   . Tuberculosis Brother   . Aneurysm Brother     cns  . Heart Problems    . COPD Sister     smoking  . Heart attack Sister 64   History  Substance Use Topics  . Smoking status: Never Smoker   . Smokeless tobacco: Never Used  . Alcohol Use: Yes     Comment: wine, seldom    OB History   Grav Para Term Preterm Abortions TAB SAB Ect Mult Living                 Review of Systems  Constitutional: Positive for fever (resolved) and appetite change (decreased). Negative  for chills, diaphoresis, activity change and fatigue.  HENT: Negative for congestion, rhinorrhea and sore throat.   Eyes: Negative for photophobia and visual disturbance.  Respiratory: Negative for cough and shortness of breath.   Cardiovascular: Positive for leg swelling. Negative for chest pain.  Gastrointestinal: Negative for nausea, vomiting and abdominal pain.  Genitourinary: Negative for dysuria.  Musculoskeletal: Positive for arthralgias and joint swelling. Negative for back pain, myalgias and neck pain.  Skin: Positive for color change (ecchymosis) and wound (post-op). Negative for rash.  Neurological: Negative for dizziness, weakness, light-headedness, numbness and headaches.    Allergies  Percocet and Lansoprazole  Home Medications   Current Outpatient Rx  Name  Route  Sig  Dispense  Refill  . acetaminophen (TYLENOL) 325 MG tablet   Oral   Take 325-650 mg by mouth every 4 (four) hours as needed for mild pain.         Marland Kitchen alum & mag hydroxide-simeth (MAALOX/MYLANTA) 200-200-20 MG/5ML suspension   Oral   Take 30 mLs by mouth every 6 (six) hours as needed for indigestion or heartburn.   355 mL   0   . antiseptic oral rinse (BIOTENE) LIQD   Mouth Rinse   15 mLs by Mouth Rinse route 2 (two) times daily.   237 mL   0   . bisacodyl (DULCOLAX) 10 MG suppository   Rectal   Place 1 suppository (10 mg total) rectally daily as needed for moderate constipation.   12 suppository   0   . docusate sodium 100 MG CAPS   Oral   Take 100 mg by mouth 2 (two) times daily.   60 capsule   0   . famotidine (PEPCID) 20 MG tablet   Oral   Take 1 tablet (20 mg total) by mouth daily.   60 tablet   0   . hydrochlorothiazide (HYDRODIURIL) 25 MG tablet   Oral   Take 12.5 mg by mouth daily as needed (Only if BP BOTTOM NUMBER IS  over 85).          Marland Kitchen HYDROmorphone (DILAUDID) 2 MG tablet   Oral   Take 1 tablet (2 mg total) by mouth every 4 (four) hours as needed for severe pain.    80 tablet   0   . iron polysaccharides (NIFEREX) 150 MG capsule   Oral   Take 1 capsule (150 mg total) by mouth 2 (two) times daily.   42 capsule   0   . levothyroxine (SYNTHROID, LEVOTHROID) 75 MCG tablet   Oral   Take 37.5 mcg by  mouth daily before breakfast.          . methocarbamol (ROBAXIN) 500 MG tablet   Oral   Take 1 tablet (500 mg total) by mouth every 6 (six) hours as needed for muscle spasms.   80 tablet   0   . ondansetron (ZOFRAN) 4 MG tablet   Oral   Take 1 tablet (4 mg total) by mouth every 6 (six) hours as needed for nausea.   40 tablet   0   . polyethylene glycol (MIRALAX / GLYCOLAX) packet   Oral   Take 17 g by mouth daily as needed for mild constipation.   14 each   0   . rivaroxaban (XARELTO) 10 MG TABS tablet   Oral   Take 1 tablet (10 mg total) by mouth daily. Take Xarelto for two and a half more weeks, then discontinue Xarelto. Once the patient has completed the Xarelto, they may resume the 81 mg Aspirin.   18 tablet   0   . traMADol (ULTRAM) 50 MG tablet   Oral   Take 1-2 tablets (50-100 mg total) by mouth every 6 (six) hours as needed (mild pain).   60 tablet   0   . potassium chloride SA (K-DUR,KLOR-CON) 20 MEQ tablet   Oral   Take 20 mEq by mouth daily. For 3 days.          BP 145/65  Pulse 76  Temp(Src) 98 F (36.7 C) (Oral)  Resp 16  SpO2 100%  Filed Vitals:   04/28/13 1242 04/28/13 1516 04/28/13 1608  BP: 145/65 139/66 128/69  Pulse: 76 81 70  Temp: 98 F (36.7 C)  98.1 F (36.7 C)  TempSrc: Oral  Oral  Resp: 16 16 16   SpO2: 100% 100% 99%     Physical Exam  Nursing note and vitals reviewed. Constitutional: She is oriented to person, place, and time. She appears well-developed and well-nourished. No distress.  HENT:  Head: Normocephalic and atraumatic.  Right Ear: External ear normal.  Left Ear: External ear normal.  Nose: Nose normal.  Mouth/Throat: Oropharynx is clear and moist.  Eyes: Conjunctivae are  normal. Right eye exhibits no discharge. Left eye exhibits no discharge.  Neck: Normal range of motion. Neck supple.  Cardiovascular: Normal rate, regular rhythm, normal heart sounds and intact distal pulses.  Exam reveals no gallop and no friction rub.   No murmur heard. Dorsalis pedis pulses present bilaterally  Pulmonary/Chest: Effort normal and breath sounds normal. No respiratory distress. She has no wheezes. She has no rales. She exhibits no tenderness.  Abdominal: Soft. She exhibits no distension. There is no tenderness.  Musculoskeletal: Normal range of motion. She exhibits edema and tenderness.       Legs: Patient able to flex/extend digits or right foot without difficulty/limitations.    Neurological: She is alert and oriented to person, place, and time.  Sensation intact in the LE bilaterally  Skin: Skin is warm and dry. She is not diaphoretic.  Ecchymosis to the right anterior tibia.  Trace pitting edema below the right knee.  Vertical incision to the right knee is clean/dry/intact with no bleeding/discharge.  Mild edema to the right knee.      ED Course  Procedures (including critical care time) Labs Review Labs Reviewed - No data to display Imaging Review No results found.  EKG Interpretation   None      DG Knee Complete 4 Views Right (Final result)  Result time: 04/28/13 14:01:19  Final result by Rad Results In Interface (04/28/13 14:01:19)    Narrative:   CLINICAL DATA: Status post right knee replacement with pain  EXAM: RIGHT KNEE - COMPLETE 4+ VIEW  COMPARISON: None.  FINDINGS: A right knee replacement is identified. No loosening is seen. A small joint effusion is noted which may be postoperative in nature. Considerable amount of soft tissue swelling is noted about the knee joint. Vascular calcifications are seen as well.  IMPRESSION: Small joint effusion.  Generalized soft tissue swelling. No acute bony abnormality is seen.   Electronically  Signed By: Alcide Clever M.D. On: 04/28/2013 14:01     Filed: 04/28/2013 3:22 PM Note Time: 04/28/2013 3:22 PM Status: Signed   Editor: Smiley Houseman, RVT (Cardiovascular Sonographer)      Right lower extremity venous duplex completed. Right: No evidence of DVT, superficial thrombosis, or Baker's cyst. Left: Negative for DVT in the common femoral vein.    MDM   Kristalynn Coddington is a 79 y.o. female  with a PMH of SVT s/p ablation 2005, hepatitis, headaches, hypothyroidism, PVC's, stricture and stenosis of esophagus, GERD, HLD, thyroid disease, DDD, HTN and chronic lower back pain who presents to the ED for evaluation of knee pain.  X-rays of the right knee.  [place] right LE ordered.     Rechecks  3:00 PM = Patient doing well.  Declines pain medication.  She states she has become nauseated with Percocet but denies any anaphylaxis reactions.  She has never had Vicodin but is willing to try it.      Etiology of increased knee pain likely due to continued post-op pain possibly exacerbated by physical therapy.  X-rays negative for fx or dislocation, but shows a small joint effusion.  No hx of injury.  [place] negative for DVT.  Patient neurovascularly intact.  No signs/symptoms of infection/compartment syndrome at this time.  Patient given Vicodin for OP pain control PRN for severe pain.  Pain well controlled without intervention in the ED.  Patient afebrile and non-toxic in appearance.  Return precautions discussed.  Patient and family in agreement with discharge and plan.  Will follow-up with orthopedics.  Transfer back to rehab facility.     Discharge Medication List as of 04/28/2013  3:03 PM    START taking these medications   Details  HYDROcodone-acetaminophen (NORCO/VICODIN) 5-325 MG per tablet Take 1 tablet by mouth every 4 (four) hours as needed., Starting 04/28/2013, Until Discontinued, Print        Final impressions: 1. Post-op pain   2. Right knee pain       Luiz Iron  PA-C   This patient was discussed with Dr. Randye Lobo, PA-C 04/28/13 2217

## 2013-04-28 NOTE — ED Notes (Signed)
Pt states they have not been able to get into car since surgery. [facility] called

## 2013-04-28 NOTE — ED Provider Notes (Signed)
Medical screening examination/treatment/procedure(s) were performed by non-physician practitioner and as supervising physician I was immediately available for consultation/collaboration.  EKG Interpretation   None      Patient's right knee examined and no evidence of bleeding. Right thigh compartment was soft. Incision intact. Will order Doppler to rule out DVT  Toy Baker, MD 04/28/13 1447

## 2013-04-28 NOTE — ED Notes (Signed)
Pt to xray

## 2013-04-29 NOTE — ED Provider Notes (Signed)
Medical screening examination/treatment/procedure(s) were conducted as a shared visit with non-physician practitioner(s) and myself.  I personally evaluated the patient during the encounter.  EKG Interpretation   None        Kamylle Axelson T Zuriel Yeaman, MD 04/29/13 1521 

## 2013-05-04 ENCOUNTER — Ambulatory Visit (INDEPENDENT_AMBULATORY_CARE_PROVIDER_SITE_OTHER): Payer: Medicare Other | Admitting: *Deleted

## 2013-05-04 ENCOUNTER — Encounter: Payer: Self-pay | Admitting: Internal Medicine

## 2013-05-04 DIAGNOSIS — I498 Other specified cardiac arrhythmias: Secondary | ICD-10-CM

## 2013-05-04 DIAGNOSIS — R001 Bradycardia, unspecified: Secondary | ICD-10-CM

## 2013-05-04 DIAGNOSIS — I441 Atrioventricular block, second degree: Secondary | ICD-10-CM

## 2013-05-04 LAB — MDC_IDC_ENUM_SESS_TYPE_INCLINIC
Battery Remaining Longevity: 144 mo
Brady Statistic AP VP Percent: 1 %
Brady Statistic AS VP Percent: 63 %
Brady Statistic AS VS Percent: 35 %
Lead Channel Impedance Value: 909 Ohm
Lead Channel Pacing Threshold Amplitude: 0.75 V
Lead Channel Pacing Threshold Amplitude: 0.75 V
Lead Channel Pacing Threshold Pulse Width: 0.4 ms
Lead Channel Sensing Intrinsic Amplitude: 2.8 mV
Lead Channel Sensing Intrinsic Amplitude: 22.4 mV
Lead Channel Setting Pacing Amplitude: 3.5 V

## 2013-05-04 NOTE — Progress Notes (Signed)
Wound check appointment. Steri-strips removed. Wound without redness or edema. Incision edges approximated, wound well healed. Normal device function. Thresholds, sensing, and impedances consistent with implant measurements. Device programmed at 3.5V/auto capture programmed on for extra safety margin until 3 month visit. Histogram distribution appropriate for patient and level of activity. 34 mode switches--all less than 1 minute. No high ventricular rates noted. Patient educated about wound care, arm mobility, lifting restrictions. ROV in 3 months with JA.

## 2013-06-06 ENCOUNTER — Ambulatory Visit (INDEPENDENT_AMBULATORY_CARE_PROVIDER_SITE_OTHER): Payer: Medicare Other | Admitting: Internal Medicine

## 2013-06-06 ENCOUNTER — Encounter: Payer: Self-pay | Admitting: Internal Medicine

## 2013-06-06 VITALS — BP 111/75 | HR 75 | Temp 98.3°F | Resp 16 | Wt 125.0 lb

## 2013-06-06 DIAGNOSIS — E876 Hypokalemia: Secondary | ICD-10-CM

## 2013-06-06 DIAGNOSIS — I10 Essential (primary) hypertension: Secondary | ICD-10-CM

## 2013-06-06 DIAGNOSIS — E039 Hypothyroidism, unspecified: Secondary | ICD-10-CM

## 2013-06-06 DIAGNOSIS — E782 Mixed hyperlipidemia: Secondary | ICD-10-CM

## 2013-06-06 DIAGNOSIS — D62 Acute posthemorrhagic anemia: Secondary | ICD-10-CM

## 2013-06-06 LAB — CBC WITH DIFFERENTIAL/PLATELET
BASOS ABS: 0 10*3/uL (ref 0.0–0.1)
Basophils Relative: 0.3 % (ref 0.0–3.0)
Eosinophils Absolute: 0.4 10*3/uL (ref 0.0–0.7)
Eosinophils Relative: 4.9 % (ref 0.0–5.0)
HEMATOCRIT: 39.7 % (ref 36.0–46.0)
Hemoglobin: 13.2 g/dL (ref 12.0–15.0)
LYMPHS ABS: 2 10*3/uL (ref 0.7–4.0)
Lymphocytes Relative: 27.6 % (ref 12.0–46.0)
MCHC: 33.3 g/dL (ref 30.0–36.0)
MCV: 89 fl (ref 78.0–100.0)
MONO ABS: 0.7 10*3/uL (ref 0.1–1.0)
Monocytes Relative: 9.8 % (ref 3.0–12.0)
NEUTROS ABS: 4.3 10*3/uL (ref 1.4–7.7)
Neutrophils Relative %: 57.4 % (ref 43.0–77.0)
PLATELETS: 375 10*3/uL (ref 150.0–400.0)
RBC: 4.46 Mil/uL (ref 3.87–5.11)
RDW: 13.5 % (ref 11.5–14.6)
WBC: 7.4 10*3/uL (ref 4.5–10.5)

## 2013-06-06 LAB — BASIC METABOLIC PANEL
BUN: 10 mg/dL (ref 6–23)
CHLORIDE: 102 meq/L (ref 96–112)
CO2: 29 mEq/L (ref 19–32)
Calcium: 9.7 mg/dL (ref 8.4–10.5)
Creatinine, Ser: 0.7 mg/dL (ref 0.4–1.2)
GFR: 82.87 mL/min (ref >60.00)
Glucose, Bld: 81 mg/dL (ref 70–99)
Potassium: 3.7 mEq/L (ref 3.5–5.1)
Sodium: 136 mEq/L (ref 135–145)

## 2013-06-06 NOTE — Assessment & Plan Note (Signed)
BMET 

## 2013-06-06 NOTE — Progress Notes (Signed)
   Subjective:    Patient ID: Shelly Ross, female    DOB: 1925/03/17, 80 y.o.   MRN: [id number]  HPI   Her hospital course was reviewed. She had a total knee replacement 04/18/23. Postoperatively in the recovery room and subsequently she exhibited bradycardia with heart rates in the 30s. Pacemaker was placed 04/19/13. She has no memory of those events. Having a pacemaker inserted did inhibit initiating and continuing physical therapy.  Labs revealed anemia with hematocrit of 25.4 and reduced calcium. She had GI symptoms with iron and it was stopped.  Her TSH was therapeutic at 2.93 in September this year.  Her lipids were excellent in July. In fact her HDL was 70 and LDL 92.   Review of Systems  Blood pressure 127/70-135/98. Latter BP post walking.  Anti hypertemsive medication taken only if diastolic > 85 . No lightheadedness or other adverse medication effect described.  Significant headaches, epistaxis, chest pain, palpitations, exertional dyspnea, claudication, paroxysmal nocturnal dyspnea, or edema absent.      Objective:   Physical Exam Appears healthy and well-nourished & in no acute distress.Appears younger than stated age  No carotid bruits are present.No neck pain distention sitting. Thyroid normal to palpation  Heart rhythm and rate are normal with no significant murmurs or gallops.Distant heart sounds  Chest is clear with no increased work of breathing  There is no evidence of aortic aneurysm or renal artery bruits  Abdomen soft with no organomegaly or masses. No HJR  No clubbing or cyanosis . Trace edema present.  Pedal pulses are intact but decreased  No ischemic skin changes are present .    Alert and oriented. Strength, tone reflexes normal          Assessment & Plan:  See Current Assessment & Plan in Problem List under specific Diagnosis

## 2013-06-06 NOTE — Assessment & Plan Note (Signed)
CBC

## 2013-06-06 NOTE — Patient Instructions (Signed)
Your next office appointment will be determined based upon review of your pending labs. Those instructions will be transmitted to you through My Chart . 

## 2013-06-06 NOTE — Progress Notes (Signed)
Pre-visit discussion using our clinic review tool. No additional management support is needed unless otherwise documented below in the visit note.  

## 2013-06-06 NOTE — Assessment & Plan Note (Signed)
No change in thyroid

## 2013-06-06 NOTE — Assessment & Plan Note (Signed)
D/C Lipitor

## 2013-06-07 ENCOUNTER — Telehealth: Payer: Self-pay | Admitting: Internal Medicine

## 2013-06-07 NOTE — Telephone Encounter (Signed)
Relevant patient education assigned to patient using Emmi. ° °

## 2013-07-21 ENCOUNTER — Ambulatory Visit (INDEPENDENT_AMBULATORY_CARE_PROVIDER_SITE_OTHER): Payer: Medicare Other | Admitting: Internal Medicine

## 2013-07-21 ENCOUNTER — Encounter: Payer: Self-pay | Admitting: Internal Medicine

## 2013-07-21 VITALS — BP 132/84 | HR 83 | Ht 64.0 in | Wt 123.0 lb

## 2013-07-21 DIAGNOSIS — I441 Atrioventricular block, second degree: Secondary | ICD-10-CM

## 2013-07-21 DIAGNOSIS — I10 Essential (primary) hypertension: Secondary | ICD-10-CM

## 2013-07-21 LAB — MDC_IDC_ENUM_SESS_TYPE_INCLINIC
Battery Impedance: 100 Ohm
Brady Statistic AP VP Percent: 4 %
Brady Statistic AP VS Percent: 8 %
Brady Statistic AS VS Percent: 72 %
Date Time Interrogation Session: 20150220142213
Lead Channel Impedance Value: 655 Ohm
Lead Channel Impedance Value: 887 Ohm
Lead Channel Pacing Threshold Amplitude: 0.75 V
Lead Channel Pacing Threshold Pulse Width: 0.4 ms
Lead Channel Pacing Threshold Pulse Width: 0.4 ms
Lead Channel Setting Sensing Sensitivity: 5.6 mV
MDC IDC MSMT BATTERY REMAINING LONGEVITY: 157 mo
MDC IDC MSMT BATTERY VOLTAGE: 2.8 V
MDC IDC MSMT LEADCHNL RA PACING THRESHOLD AMPLITUDE: 0.5 V
MDC IDC MSMT LEADCHNL RA SENSING INTR AMPL: 2.8 mV
MDC IDC MSMT LEADCHNL RV SENSING INTR AMPL: 22.4 mV
MDC IDC SET LEADCHNL RA PACING AMPLITUDE: 2 V
MDC IDC SET LEADCHNL RV PACING AMPLITUDE: 2.5 V
MDC IDC SET LEADCHNL RV PACING PULSEWIDTH: 0.4 ms
MDC IDC STAT BRADY AS VP PERCENT: 16 %

## 2013-07-21 NOTE — Patient Instructions (Signed)
Remote monitoring is used to monitor your pacemaker from home. This monitoring reduces the number of office visits required to check your device to one time per year. It allows [place] to keep an eye on the functioning of your device to ensure it is working properly. You are scheduled for a device check from home on 10-24-2013. You may send your transmission at any time that day. If you have a wireless device, the transmission will be sent automatically. After your physician reviews your transmission, you will receive a postcard with your next transmission date.  Your physician recommends that you schedule a follow-up appointment in: 12 months with Dr.Allred

## 2013-07-21 NOTE — Progress Notes (Signed)
PCP: Unice Cobble, MD  Shelly Ross is a 80 y.o. female who presents today for routine electrophysiology followup.  Since having her pacemaker implanted, the patient reports doing very well.  Today, she denies symptoms of palpitations, chest pain, shortness of breath,  lower extremity edema, dizziness, presyncope, or syncope.  The patient is otherwise without complaint today.   Past Medical History  Diagnosis Date  . Chronic low back pain   . Dizziness     some  . Hypertension   . Neuralgia, neuritis, and radiculitis, unspecified   . Degenerative disc disease     CERVICAL SPINE,W/RADICULOPATHY  . GERD (gastroesophageal reflux disease)   . Hyperlipidemia   . Thyroid disease     HYPOTHYROIDISM  . Osteoporosis   . Rib fractures   . Stricture and stenosis of esophagus 2007  . Adenomatous colon polyp 2003  . Premature ventricular contractions 2006  . Hypothyroidism   . Headache(784.0)     not migraines, but a lot of headaches  . Hepatitis     jaundice as child  . SVT (supraventricular tachycardia)     S/P ablation 2005   Past Surgical History  Procedure Laterality Date  . Abdominal hysterectomy      WITH OOPHORECTOMY,? BILATERAL FOR ENDOMETRIOSIS  . Breast enhancement surgery    . G2 p2    . Bladder tacking    . Endovenous ablation saphenous vein w/ laser  07-13-2011    right greater saphenous vein, Dr Mamie Nick  . Endovenous ablation saphenous vein w/ laser  08/2011    L ; Dr Kellie Simmering  . Cataract extraction Bilateral   . Esophageal dilation       X 1  . Colonoscopy    . Cardiac electrophysiology mapping and ablation    . Total knee arthroplasty Right 04/17/2013    Procedure: RIGHT TOTAL KNEE ARTHROPLASTY, CORTISONE INJECTION LEFT KNEE;  Surgeon: Gearlean Alf, MD;  Location: [facility];  Service: Orthopedics;  Laterality: Right;  . Pacemaker insertion  04/19/13    MDT Adapta L iimplanted by Dr Rayann Heman for mobitz II second degree AV block    Current Outpatient  Prescriptions  Medication Sig Dispense Refill  . acetaminophen (TYLENOL) 325 MG tablet Take 325-650 mg by mouth every 4 (four) hours as needed for mild pain.      Marland Kitchen aspirin 81 MG tablet Take 81 mg by mouth daily.      . hydrochlorothiazide (HYDRODIURIL) 25 MG tablet Take 12.5 mg by mouth daily as needed (Only if BP BOTTOM NUMBER IS  over 85).       Marland Kitchen levothyroxine (SYNTHROID, LEVOTHROID) 75 MCG tablet Take 37.5 mcg by mouth daily before breakfast.        No current facility-administered medications for this visit.    Physical Exam: Filed Vitals:   07/21/13 1049  BP: 132/84  Pulse: 83  Height: 5\' 4"  (1.626 m)  Weight: 123 lb (55.792 kg)    GEN- The patient is well appearing, alert and oriented x 3 today.   Head- normocephalic, atraumatic Eyes-  Sclera clear, conjunctiva pink Ears- hearing intact Oropharynx- clear Lungs- Clear to ausculation bilaterally, normal work of breathing Chest- pacemaker pocket is well healed Heart- Regular rate and rhythm, no murmurs, rubs or gallops, PMI not laterally displaced GI- soft, NT, ND, + BS Extremities- no clubbing, cyanosis, or edema  Pacemaker interrogation- reviewed in detail today,  See PACEART report  Assessment and Plan:  1. Mobitz II second degree AV block  Normal pacemaker function See Pace Art report No changes today  2. HTN Stable No change required today  carelink Return to see me in 9 months

## 2013-08-04 ENCOUNTER — Encounter: Payer: Self-pay | Admitting: Internal Medicine

## 2013-10-24 ENCOUNTER — Encounter: Payer: Self-pay | Admitting: Internal Medicine

## 2013-10-24 ENCOUNTER — Ambulatory Visit (INDEPENDENT_AMBULATORY_CARE_PROVIDER_SITE_OTHER): Payer: Medicare Other | Admitting: *Deleted

## 2013-10-24 DIAGNOSIS — I441 Atrioventricular block, second degree: Secondary | ICD-10-CM

## 2013-10-24 NOTE — Progress Notes (Signed)
Remote pacemaker transmission.   

## 2013-10-29 ENCOUNTER — Emergency Department (HOSPITAL_COMMUNITY): Payer: Medicare Other

## 2013-10-29 ENCOUNTER — Encounter (HOSPITAL_COMMUNITY): Payer: Self-pay | Admitting: Emergency Medicine

## 2013-10-29 ENCOUNTER — Emergency Department (HOSPITAL_COMMUNITY)
Admission: EM | Admit: 2013-10-29 | Discharge: 2013-10-29 | Disposition: A | Discharge status: Home / Self Care | Payer: Medicare Other | Attending: Emergency Medicine | Admitting: Emergency Medicine

## 2013-10-29 DIAGNOSIS — I10 Essential (primary) hypertension: Secondary | ICD-10-CM | POA: Insufficient documentation

## 2013-10-29 DIAGNOSIS — Z860100 Personal history of colon polyps, unspecified: Secondary | ICD-10-CM | POA: Insufficient documentation

## 2013-10-29 DIAGNOSIS — Z79899 Other long term (current) drug therapy: Secondary | ICD-10-CM | POA: Insufficient documentation

## 2013-10-29 DIAGNOSIS — Z8719 Personal history of other diseases of the digestive system: Secondary | ICD-10-CM | POA: Insufficient documentation

## 2013-10-29 DIAGNOSIS — Z7982 Long term (current) use of aspirin: Secondary | ICD-10-CM | POA: Insufficient documentation

## 2013-10-29 DIAGNOSIS — G8929 Other chronic pain: Secondary | ICD-10-CM | POA: Insufficient documentation

## 2013-10-29 DIAGNOSIS — E785 Hyperlipidemia, unspecified: Secondary | ICD-10-CM | POA: Insufficient documentation

## 2013-10-29 DIAGNOSIS — E039 Hypothyroidism, unspecified: Secondary | ICD-10-CM | POA: Insufficient documentation

## 2013-10-29 DIAGNOSIS — Z8781 Personal history of (healed) traumatic fracture: Secondary | ICD-10-CM | POA: Insufficient documentation

## 2013-10-29 DIAGNOSIS — M549 Dorsalgia, unspecified: Secondary | ICD-10-CM

## 2013-10-29 DIAGNOSIS — M533 Sacrococcygeal disorders, not elsewhere classified: Secondary | ICD-10-CM | POA: Insufficient documentation

## 2013-10-29 MED ORDER — IBUPROFEN 200 MG PO TABS
600.0000 mg | ORAL_TABLET | Freq: Once | ORAL | Status: AC
  Administered 2013-10-29: 600 mg via ORAL
  Filled 2013-10-29: qty 3

## 2013-10-29 NOTE — ED Notes (Signed)
Pt reports L hip pain that radiates to lower back for last few weeks, normally gets better through the day but got worse today. No pain meds at home today. Denies known injury.

## 2013-10-29 NOTE — Discharge Instructions (Signed)
As discussed, your evaluation today has been largely reassuring.  But, it is important that you monitor your condition carefully, and do not hesitate to return to the ED if you develop new, or concerning changes in your condition.  As you're x-ray demonstrates, there are degenerative changes, and likely inflammation about your lower back and sacrum.  Please take ibuprofen, 400 mg, 3 times daily for the next 4 days.  Please use ice packs 4 times daily for the next 4 days.  Please Tylenol or tramadol for breakthrough pain.

## 2013-10-29 NOTE — ED Provider Notes (Signed)
CSN: [id number]     Arrival date & time 10/29/13  0932 History   First MD Initiated Contact with Patient 10/29/13 1131     Chief Complaint  Patient presents with  . Hip Pain     HPI  The patient presents with concerns of ongoing left hip pain. The pain is focally about the left lateral SI joint area, left posterior superior iliac crest, nonradiating, severe.  Pain is worse in the morning, improves during the day.  Today this did not occur.  There is no new weakness, numbness, urinary complaints, fevers, chills, abdominal pain. relief today with any medication, though the patient previously has tried ibuprofen with minimal change.  Past Medical History  Diagnosis Date  . Chronic low back pain   . Dizziness     some  . Hypertension   . Neuralgia, neuritis, and radiculitis, unspecified   . Degenerative disc disease     CERVICAL SPINE,W/RADICULOPATHY  . GERD (gastroesophageal reflux disease)   . Hyperlipidemia   . Thyroid disease     HYPOTHYROIDISM  . Osteoporosis   . Rib fractures   . Stricture and stenosis of esophagus 2007  . Adenomatous colon polyp 2003  . Premature ventricular contractions 2006  . Hypothyroidism   . Headache(784.0)     not migraines, but a lot of headaches  . Hepatitis     jaundice as child  . SVT (supraventricular tachycardia)     S/P ablation 2005   Past Surgical History  Procedure Laterality Date  . Abdominal hysterectomy      WITH OOPHORECTOMY,? BILATERAL FOR ENDOMETRIOSIS  . Breast enhancement surgery    . G2 p2    . Bladder tacking    . Endovenous ablation saphenous vein w/ laser  07-13-2011    right greater saphenous vein, Dr Mamie Nick  . Endovenous ablation saphenous vein w/ laser  08/2011    L ; Dr Kellie Simmering  . Cataract extraction Bilateral   . Esophageal dilation       X 1  . Colonoscopy    . Cardiac electrophysiology mapping and ablation    . Total knee arthroplasty Right 04/17/2013    Procedure: RIGHT TOTAL KNEE ARTHROPLASTY,  CORTISONE INJECTION LEFT KNEE;  Surgeon: Gearlean Alf, MD;  Location: [facility];  Service: Orthopedics;  Laterality: Right;  . Pacemaker insertion  04/19/13    MDT Adapta L iimplanted by Dr Rayann Heman for mobitz II second degree AV block   Family History  Problem Relation Age of Onset  . Stroke Mother 14  . Heart attack Father     in 41s  . Deep vein thrombosis Brother     AND PTE  . Diabetes Brother   . Cancer Paternal Aunt      INTESTINAL   . Alzheimer's disease Sister   . Alzheimer's disease Brother   . Tuberculosis Brother   . Aneurysm Brother     cns  . Heart Problems    . COPD Sister     smoking  . Heart attack Sister 54   History  Substance Use Topics  . Smoking status: Never Smoker   . Smokeless tobacco: Never Used  . Alcohol Use: Yes     Comment: wine, seldom    OB History   Grav Para Term Preterm Abortions TAB SAB Ect Mult Living                 Review of Systems  All other systems reviewed and are negative.  Allergies  Percocet and Lansoprazole  Home Medications   Prior to Admission medications   Medication Sig Start Date End Date Taking? Authorizing Provider  acetaminophen (TYLENOL) 325 MG tablet Take 325-650 mg by mouth every 4 (four) hours as needed for mild pain. 04/21/13  Yes Arlee Muslim, PA-C  aspirin 81 MG tablet Take 81 mg by mouth daily.   Yes Historical Provider, MD  hydrochlorothiazide (HYDRODIURIL) 25 MG tablet Take 12.5 mg by mouth daily.    Yes Historical Provider, MD  levothyroxine (SYNTHROID, LEVOTHROID) 75 MCG tablet Take 37.5 mcg by mouth every evening.  09/21/12  Yes Hendricks Limes, MD   BP 138/63  Pulse 65  Temp(Src) 97.8 F (36.6 C) (Oral)  Resp 14  SpO2 100% Physical Exam  Nursing note and vitals reviewed. Constitutional: She is oriented to person, place, and time. She appears well-developed and well-nourished. No distress.  HENT:  Head: Normocephalic and atraumatic.  Eyes: Conjunctivae and EOM are normal.    Cardiovascular: Normal rate, regular rhythm and intact distal pulses.   Pulmonary/Chest: Effort normal and breath sounds normal. No stridor. No respiratory distress.  Abdominal: She exhibits no distension.  Musculoskeletal: She exhibits no edema.       Back:       Legs: Neurological: She is alert and oriented to person, place, and time. No cranial nerve deficit.  Skin: Skin is warm and dry.  Psychiatric: She has a normal mood and affect.    ED Course  Procedures (including critical care time)  Imaging Review Dg Hip Complete Left  10/29/2013   CLINICAL DATA:  Left hip pain  EXAM: LEFT HIP - COMPLETE 2+ VIEW  COMPARISON:  None.  FINDINGS: There is no evidence of hip fracture or dislocation. Mild degenerative changes of the lumbar spine are seen with a scoliosis concave to the right. No soft tissue changes are seen.  IMPRESSION: No acute abnormality noted.   Electronically Signed   By: Inez Catalina M.D.   On: 10/29/2013 10:28    I reviewed the x-rays were demonstrated to the patient and her family members.  There is notable scoliosis, abnormal     MDM   Final diagnoses:  Back pain    Patient presents with left back/hip pain.  Patient is nervous intact, hemodynamically stable.  She has no history of falls, trauma.  X-rays do not demonstrate fracture, though there is notable scoliosis and likely inflammatory changes are causing her symptoms, radiculopathy. Patient was started on a course of meds, and d/c in stable condition.     Carmin Muskrat, MD 10/29/13 [id number]

## 2013-10-31 LAB — MDC_IDC_ENUM_SESS_TYPE_REMOTE
Battery Impedance: 100 Ohm
Battery Remaining Longevity: 153 mo
Battery Voltage: 2.79 V
Brady Statistic AP VP Percent: 14 %
Brady Statistic AS VP Percent: 22 %
Brady Statistic AS VS Percent: 56 %
Date Time Interrogation Session: 20150526140614
Lead Channel Impedance Value: 657 Ohm
Lead Channel Impedance Value: 869 Ohm
Lead Channel Pacing Threshold Pulse Width: 0.4 ms
Lead Channel Pacing Threshold Pulse Width: 0.4 ms
Lead Channel Sensing Intrinsic Amplitude: 2.8 mV
Lead Channel Setting Pacing Amplitude: 2.5 V
Lead Channel Setting Sensing Sensitivity: 5.6 mV
MDC IDC MSMT LEADCHNL RA PACING THRESHOLD AMPLITUDE: 0.75 V
MDC IDC MSMT LEADCHNL RV PACING THRESHOLD AMPLITUDE: 0.75 V
MDC IDC MSMT LEADCHNL RV SENSING INTR AMPL: 22.4 mV
MDC IDC SET LEADCHNL RA PACING AMPLITUDE: 2 V
MDC IDC SET LEADCHNL RV PACING PULSEWIDTH: 0.4 ms
MDC IDC STAT BRADY AP VS PERCENT: 7 %

## 2013-11-10 ENCOUNTER — Encounter: Payer: Self-pay | Admitting: Cardiology

## 2014-01-29 ENCOUNTER — Ambulatory Visit (INDEPENDENT_AMBULATORY_CARE_PROVIDER_SITE_OTHER): Payer: Medicare Other | Admitting: *Deleted

## 2014-01-29 DIAGNOSIS — R001 Bradycardia, unspecified: Secondary | ICD-10-CM

## 2014-01-29 DIAGNOSIS — I441 Atrioventricular block, second degree: Secondary | ICD-10-CM

## 2014-01-29 DIAGNOSIS — I498 Other specified cardiac arrhythmias: Secondary | ICD-10-CM

## 2014-01-29 LAB — MDC_IDC_ENUM_SESS_TYPE_REMOTE
Battery Remaining Longevity: 12.5
Battery Voltage: 2.79 V
Lead Channel Impedance Value: 809 Ohm
Lead Channel Pacing Threshold Pulse Width: 0.4 ms
Lead Channel Sensing Intrinsic Amplitude: 16 mV
Lead Channel Setting Pacing Amplitude: 2 V
Lead Channel Setting Pacing Pulse Width: 0.4 ms
Lead Channel Setting Sensing Sensitivity: 5.6 mV
MDC IDC MSMT BATTERY IMPEDANCE: 100 Ohm
MDC IDC MSMT LEADCHNL RA IMPEDANCE VALUE: 623 Ohm
MDC IDC MSMT LEADCHNL RA PACING THRESHOLD AMPLITUDE: 0.75 V
MDC IDC MSMT LEADCHNL RA PACING THRESHOLD PULSEWIDTH: 0.4 ms
MDC IDC MSMT LEADCHNL RA SENSING INTR AMPL: 1 mV
MDC IDC MSMT LEADCHNL RV PACING THRESHOLD AMPLITUDE: 0.75 V
MDC IDC SET LEADCHNL RV PACING AMPLITUDE: 2.5 V
MDC IDC STAT BRADY AP VP PERCENT: 18.5 %
MDC IDC STAT BRADY AP VS PERCENT: 5.7 %
MDC IDC STAT BRADY AS VP PERCENT: 34.5 %
MDC IDC STAT BRADY AS VS PERCENT: 41.4 %

## 2014-01-29 NOTE — Progress Notes (Signed)
Remote pacemaker transmission.   

## 2014-02-08 ENCOUNTER — Encounter: Payer: Self-pay | Admitting: Cardiology

## 2014-02-13 ENCOUNTER — Encounter: Payer: Self-pay | Admitting: Cardiology

## 2014-02-21 ENCOUNTER — Encounter: Payer: Self-pay | Admitting: Internal Medicine

## 2014-05-02 ENCOUNTER — Telehealth: Payer: Self-pay | Admitting: Cardiology

## 2014-05-02 ENCOUNTER — Ambulatory Visit (INDEPENDENT_AMBULATORY_CARE_PROVIDER_SITE_OTHER): Payer: Medicare Other | Admitting: *Deleted

## 2014-05-02 ENCOUNTER — Encounter: Payer: Self-pay | Admitting: Internal Medicine

## 2014-05-02 DIAGNOSIS — I441 Atrioventricular block, second degree: Secondary | ICD-10-CM

## 2014-05-02 NOTE — Telephone Encounter (Signed)
Confirmed remote transmission w/ pt daughter.   

## 2014-05-02 NOTE — Progress Notes (Signed)
Remote pacemaker transmission.   

## 2014-05-03 LAB — MDC_IDC_ENUM_SESS_TYPE_REMOTE
Battery Impedance: 108 Ohm
Battery Remaining Longevity: 145 mo
Battery Voltage: 2.79 V
Brady Statistic AP VP Percent: 22 %
Brady Statistic AS VP Percent: 43 %
Lead Channel Impedance Value: 678 Ohm
Lead Channel Impedance Value: 880 Ohm
Lead Channel Pacing Threshold Amplitude: 0.875 V
Lead Channel Pacing Threshold Pulse Width: 0.4 ms
Lead Channel Sensing Intrinsic Amplitude: 1.4 mV
Lead Channel Sensing Intrinsic Amplitude: 16 mV
Lead Channel Setting Pacing Pulse Width: 0.4 ms
Lead Channel Setting Sensing Sensitivity: 5.6 mV
MDC IDC MSMT LEADCHNL RA PACING THRESHOLD PULSEWIDTH: 0.4 ms
MDC IDC MSMT LEADCHNL RV PACING THRESHOLD AMPLITUDE: 0.75 V
MDC IDC SESS DTM: 20151202173234
MDC IDC SET LEADCHNL RA PACING AMPLITUDE: 2 V
MDC IDC SET LEADCHNL RV PACING AMPLITUDE: 2.5 V
MDC IDC STAT BRADY AP VS PERCENT: 4 %
MDC IDC STAT BRADY AS VS PERCENT: 31 %

## 2014-05-08 ENCOUNTER — Encounter: Payer: Self-pay | Admitting: Cardiology

## 2014-05-10 ENCOUNTER — Encounter (HOSPITAL_COMMUNITY): Payer: Self-pay | Admitting: Internal Medicine

## 2014-05-22 ENCOUNTER — Encounter: Payer: Self-pay | Admitting: Cardiology

## 2014-05-24 ENCOUNTER — Emergency Department (HOSPITAL_COMMUNITY): Payer: Medicare Other

## 2014-05-24 ENCOUNTER — Encounter (HOSPITAL_COMMUNITY): Payer: Self-pay | Admitting: Emergency Medicine

## 2014-05-24 ENCOUNTER — Inpatient Hospital Stay (HOSPITAL_COMMUNITY)
Admission: EM | Admit: 2014-05-24 | Discharge: 2014-05-28 | DRG: 184 | Disposition: A | Discharge status: Skilled Nursing Facility | Payer: Medicare Other | Attending: General Surgery | Admitting: General Surgery

## 2014-05-24 DIAGNOSIS — W182XXA Fall in (into) shower or empty bathtub, initial encounter: Secondary | ICD-10-CM | POA: Diagnosis not present

## 2014-05-24 DIAGNOSIS — T1490XA Injury, unspecified, initial encounter: Secondary | ICD-10-CM

## 2014-05-24 DIAGNOSIS — K5900 Constipation, unspecified: Secondary | ICD-10-CM | POA: Diagnosis not present

## 2014-05-24 DIAGNOSIS — E039 Hypothyroidism, unspecified: Secondary | ICD-10-CM | POA: Diagnosis present

## 2014-05-24 DIAGNOSIS — M810 Age-related osteoporosis without current pathological fracture: Secondary | ICD-10-CM | POA: Diagnosis present

## 2014-05-24 DIAGNOSIS — I10 Essential (primary) hypertension: Secondary | ICD-10-CM | POA: Diagnosis present

## 2014-05-24 DIAGNOSIS — Z7982 Long term (current) use of aspirin: Secondary | ICD-10-CM | POA: Diagnosis not present

## 2014-05-24 DIAGNOSIS — Z950 Presence of cardiac pacemaker: Secondary | ICD-10-CM

## 2014-05-24 DIAGNOSIS — Z9841 Cataract extraction status, right eye: Secondary | ICD-10-CM | POA: Diagnosis not present

## 2014-05-24 DIAGNOSIS — Z90710 Acquired absence of both cervix and uterus: Secondary | ICD-10-CM

## 2014-05-24 DIAGNOSIS — R109 Unspecified abdominal pain: Secondary | ICD-10-CM | POA: Diagnosis not present

## 2014-05-24 DIAGNOSIS — K219 Gastro-esophageal reflux disease without esophagitis: Secondary | ICD-10-CM | POA: Diagnosis not present

## 2014-05-24 DIAGNOSIS — S2249XA Multiple fractures of ribs, unspecified side, initial encounter for closed fracture: Secondary | ICD-10-CM | POA: Diagnosis present

## 2014-05-24 DIAGNOSIS — Z79899 Other long term (current) drug therapy: Secondary | ICD-10-CM | POA: Diagnosis not present

## 2014-05-24 DIAGNOSIS — J9811 Atelectasis: Secondary | ICD-10-CM | POA: Diagnosis present

## 2014-05-24 DIAGNOSIS — R079 Chest pain, unspecified: Secondary | ICD-10-CM

## 2014-05-24 DIAGNOSIS — Y92091 Bathroom in other non-institutional residence as the place of occurrence of the external cause: Secondary | ICD-10-CM | POA: Diagnosis not present

## 2014-05-24 DIAGNOSIS — Z7983 Long term (current) use of bisphosphonates: Secondary | ICD-10-CM | POA: Diagnosis not present

## 2014-05-24 DIAGNOSIS — W19XXXA Unspecified fall, initial encounter: Secondary | ICD-10-CM

## 2014-05-24 DIAGNOSIS — E785 Hyperlipidemia, unspecified: Secondary | ICD-10-CM | POA: Diagnosis not present

## 2014-05-24 DIAGNOSIS — M25512 Pain in left shoulder: Secondary | ICD-10-CM | POA: Diagnosis not present

## 2014-05-24 DIAGNOSIS — Z9842 Cataract extraction status, left eye: Secondary | ICD-10-CM | POA: Diagnosis not present

## 2014-05-24 DIAGNOSIS — Y93E1 Activity, personal bathing and showering: Secondary | ICD-10-CM | POA: Diagnosis not present

## 2014-05-24 DIAGNOSIS — S2232XA Fracture of one rib, left side, initial encounter for closed fracture: Secondary | ICD-10-CM

## 2014-05-24 DIAGNOSIS — S0003XA Contusion of scalp, initial encounter: Secondary | ICD-10-CM

## 2014-05-24 DIAGNOSIS — S2242XA Multiple fractures of ribs, left side, initial encounter for closed fracture: Principal | ICD-10-CM | POA: Diagnosis present

## 2014-05-24 LAB — URINALYSIS, ROUTINE W REFLEX MICROSCOPIC
BILIRUBIN URINE: NEGATIVE
Glucose, UA: NEGATIVE mg/dL
HGB URINE DIPSTICK: NEGATIVE
Ketones, ur: 15 mg/dL — AB
Leukocytes, UA: NEGATIVE
Nitrite: NEGATIVE
Protein, ur: NEGATIVE mg/dL
UROBILINOGEN UA: 0.2 mg/dL (ref 0.0–1.0)
pH: 7.5 (ref 5.0–8.0)

## 2014-05-24 LAB — CBC WITH DIFFERENTIAL/PLATELET
Basophils Absolute: 0 10*3/uL (ref 0.0–0.1)
Basophils Relative: 0 % (ref 0–1)
Eosinophils Absolute: 0.1 10*3/uL (ref 0.0–0.7)
Eosinophils Relative: 1 % (ref 0–5)
HCT: 45.6 % (ref 36.0–46.0)
HEMOGLOBIN: 15 g/dL (ref 12.0–15.0)
LYMPHS PCT: 9 % — AB (ref 12–46)
Lymphs Abs: 1.2 10*3/uL (ref 0.7–4.0)
MCH: 29.8 pg (ref 26.0–34.0)
MCHC: 32.9 g/dL (ref 30.0–36.0)
MCV: 90.7 fL (ref 78.0–100.0)
Monocytes Absolute: 0.8 10*3/uL (ref 0.1–1.0)
Monocytes Relative: 6 % (ref 3–12)
NEUTROS ABS: 11.8 10*3/uL — AB (ref 1.7–7.7)
Neutrophils Relative %: 84 % — ABNORMAL HIGH (ref 43–77)
PLATELETS: 285 10*3/uL (ref 150–400)
RBC: 5.03 MIL/uL (ref 3.87–5.11)
RDW: 13 % (ref 11.5–15.5)
WBC: 13.9 10*3/uL — AB (ref 4.0–10.5)

## 2014-05-24 LAB — COMPREHENSIVE METABOLIC PANEL
ALK PHOS: 66 U/L (ref 39–117)
ALT: 18 U/L (ref 0–35)
AST: 25 U/L (ref 0–37)
Albumin: 3.7 g/dL (ref 3.5–5.2)
Anion gap: 7 (ref 5–15)
BILIRUBIN TOTAL: 0.5 mg/dL (ref 0.3–1.2)
BUN: 20 mg/dL (ref 6–23)
CHLORIDE: 103 meq/L (ref 96–112)
CO2: 26 mmol/L (ref 19–32)
Calcium: 9.7 mg/dL (ref 8.4–10.5)
Creatinine, Ser: 0.68 mg/dL (ref 0.50–1.10)
GFR calc non Af Amer: 76 mL/min — ABNORMAL LOW (ref >90)
GFR, EST AFRICAN AMERICAN: 89 mL/min — AB (ref >90)
GLUCOSE: 126 mg/dL — AB (ref 70–99)
POTASSIUM: 3.5 mmol/L (ref 3.5–5.1)
SODIUM: 136 mmol/L (ref 135–145)
TOTAL PROTEIN: 6.7 g/dL (ref 6.0–8.3)

## 2014-05-24 LAB — MRSA PCR SCREENING: MRSA by PCR: NEGATIVE

## 2014-05-24 MED ORDER — ONDANSETRON HCL 4 MG/2ML IJ SOLN
4.0000 mg | Freq: Once | INTRAMUSCULAR | Status: AC
  Administered 2014-05-24: 4 mg via INTRAVENOUS
  Filled 2014-05-24: qty 2

## 2014-05-24 MED ORDER — SODIUM CHLORIDE 0.9 % IJ SOLN: 3.0000 mL | INTRAMUSCULAR | Status: DC | PRN

## 2014-05-24 MED ORDER — HYDROCHLOROTHIAZIDE 25 MG PO TABS
12.5000 mg | ORAL_TABLET | Freq: Every day | ORAL | Status: DC
  Filled 2014-05-24: qty 0.5

## 2014-05-24 MED ORDER — HYDROCHLOROTHIAZIDE 12.5 MG PO CAPS
12.5000 mg | ORAL_CAPSULE | Freq: Every day | ORAL | Status: DC
  Administered 2014-05-24 – 2014-05-28 (×5): 12.5 mg via ORAL
  Filled 2014-05-24 (×5): qty 1

## 2014-05-24 MED ORDER — MORPHINE SULFATE 2 MG/ML IJ SOLN
2.0000 mg | INTRAMUSCULAR | Status: DC | PRN
  Administered 2014-05-24 – 2014-05-26 (×3): 2 mg via INTRAVENOUS
  Filled 2014-05-24 (×3): qty 1

## 2014-05-24 MED ORDER — POLYETHYLENE GLYCOL 3350 17 G PO PACK
17.0000 g | PACK | Freq: Every day | ORAL | Status: DC
  Administered 2014-05-25 – 2014-05-27 (×3): 17 g via ORAL
  Filled 2014-05-24 (×4): qty 1

## 2014-05-24 MED ORDER — LEVOTHYROXINE SODIUM 25 MCG PO TABS
12.5000 ug | ORAL_TABLET | Freq: Every day | ORAL | Status: DC
  Administered 2014-05-25 – 2014-05-28 (×4): 12.5 ug via ORAL
  Filled 2014-05-24 (×5): qty 0.5

## 2014-05-24 MED ORDER — IOHEXOL 300 MG/ML  SOLN
100.0000 mL | Freq: Once | INTRAMUSCULAR | Status: AC | PRN
  Administered 2014-05-24: 100 mL via INTRAVENOUS

## 2014-05-24 MED ORDER — ASPIRIN 81 MG PO CHEW
81.0000 mg | CHEWABLE_TABLET | Freq: Every day | ORAL | Status: DC
Start: 2014-05-24 — End: 2014-05-28
  Administered 2014-05-24 – 2014-05-28 (×5): 81 mg via ORAL
  Filled 2014-05-24 (×5): qty 1

## 2014-05-24 MED ORDER — SODIUM CHLORIDE 0.9 % IV SOLN: 250.0000 mL | INTRAVENOUS | Status: DC | PRN

## 2014-05-24 MED ORDER — ONDANSETRON HCL 4 MG PO TABS: 4.0000 mg | ORAL_TABLET | Freq: Four times a day (QID) | ORAL | Status: DC | PRN

## 2014-05-24 MED ORDER — ENOXAPARIN SODIUM 40 MG/0.4ML ~~LOC~~ SOLN
40.0000 mg | SUBCUTANEOUS | Status: DC
  Administered 2014-05-24 – 2014-05-27 (×4): 40 mg via SUBCUTANEOUS
  Filled 2014-05-24 (×5): qty 0.4

## 2014-05-24 MED ORDER — SODIUM CHLORIDE 0.9 % IJ SOLN
3.0000 mL | Freq: Two times a day (BID) | INTRAMUSCULAR | Status: DC
  Administered 2014-05-24 – 2014-05-28 (×8): 3 mL via INTRAVENOUS

## 2014-05-24 MED ORDER — ONDANSETRON HCL 4 MG/2ML IJ SOLN: 4.0000 mg | Freq: Four times a day (QID) | INTRAMUSCULAR | Status: DC | PRN

## 2014-05-24 MED ORDER — MORPHINE SULFATE 4 MG/ML IJ SOLN
4.0000 mg | Freq: Once | INTRAMUSCULAR | Status: AC
  Administered 2014-05-24: 4 mg via INTRAVENOUS
  Filled 2014-05-24: qty 1

## 2014-05-24 MED ORDER — DOCUSATE SODIUM 100 MG PO CAPS
100.0000 mg | ORAL_CAPSULE | Freq: Two times a day (BID) | ORAL | Status: DC
  Administered 2014-05-24 – 2014-05-28 (×8): 100 mg via ORAL
  Filled 2014-05-24 (×8): qty 1

## 2014-05-24 MED ORDER — TRAMADOL HCL 50 MG PO TABS
50.0000 mg | ORAL_TABLET | Freq: Four times a day (QID) | ORAL | Status: DC | PRN
  Administered 2014-05-25 (×2): 50 mg via ORAL
  Administered 2014-05-26 – 2014-05-28 (×5): 100 mg via ORAL
  Filled 2014-05-24: qty 1
  Filled 2014-05-24 (×3): qty 2
  Filled 2014-05-24: qty 1
  Filled 2014-05-24 (×2): qty 2

## 2014-05-24 MED ORDER — HYDROCODONE-ACETAMINOPHEN 5-325 MG PO TABS
1.0000 | ORAL_TABLET | Freq: Once | ORAL | Status: AC
  Administered 2014-05-24: 1 via ORAL
  Filled 2014-05-24: qty 1

## 2014-05-24 NOTE — ED Notes (Signed)
Pt unable to provide a urine sample at this time.

## 2014-05-24 NOTE — ED Notes (Signed)
MD Redmond Pulling to be paged to  When patient arrive to floor.

## 2014-05-24 NOTE — ED Notes (Signed)
Pt still in XRAY.  

## 2014-05-24 NOTE — ED Notes (Signed)
MD Yao at bedside 

## 2014-05-24 NOTE — ED Notes (Signed)
Bed: [facility] Expected date:  Expected time:  Means of arrival:  Comments: EMS- elderly, slip and fall, no LOC or thinners

## 2014-05-24 NOTE — ED Notes (Addendum)
Attempted to call report to [facility] 3 S RN to call me back. Call back number provided to secretary

## 2014-05-24 NOTE — ED Notes (Signed)
Surgery here to see patient

## 2014-05-24 NOTE — H&P (Signed)
Shelly Ross is an 80 y.o. female.   Chief Complaint: Fall HPI: Shelly Ross lives at [place] [facility]. She was getting out of the shower today and fell backwards striking her left side. She denies presyncope or syncope or loss of consciousness. She had trouble breathing and moving and came to the ED at [facility] for evaluation. Workup showed multiple left rib fractures and trauma was asked to admit.  Past Medical History  Diagnosis Date  . Chronic low back pain   . Dizziness     some  . Hypertension   . Neuralgia, neuritis, and radiculitis, unspecified   . Degenerative disc disease     CERVICAL SPINE,W/RADICULOPATHY  . GERD (gastroesophageal reflux disease)   . Hyperlipidemia   . Thyroid disease     HYPOTHYROIDISM  . Osteoporosis   . Rib fractures   . Stricture and stenosis of esophagus 2007  . Adenomatous colon polyp 2003  . Premature ventricular contractions 2006  . Hypothyroidism   . Headache(784.0)     not migraines, but a lot of headaches  . Hepatitis     jaundice as child  . SVT (supraventricular tachycardia)     S/P ablation 2005    Past Surgical History  Procedure Laterality Date  . Abdominal hysterectomy      WITH OOPHORECTOMY,? BILATERAL FOR ENDOMETRIOSIS  . Breast enhancement surgery    . G2 p2    . Bladder tacking    . Endovenous ablation saphenous vein w/ laser  07-13-2011    right greater saphenous vein, Dr Mamie Nick  . Endovenous ablation saphenous vein w/ laser  08/2011    L ; Dr Kellie Simmering  . Cataract extraction Bilateral   . Esophageal dilation       X 1  . Colonoscopy    . Cardiac electrophysiology mapping and ablation    . Total knee arthroplasty Right 04/17/2013    Procedure: RIGHT TOTAL KNEE ARTHROPLASTY, CORTISONE INJECTION LEFT KNEE;  Surgeon: Gearlean Alf, MD;  Location: [facility];  Service: Orthopedics;  Laterality: Right;  . Pacemaker insertion  04/19/13    MDT Adapta L iimplanted by Dr Rayann Heman for mobitz II second degree AV block  . Permanent  pacemaker insertion N/A 04/19/2013    Procedure: PERMANENT PACEMAKER INSERTION;  Surgeon: Coralyn Mark, MD;  Location: [facility];  Service: Cardiovascular;  Laterality: N/A;    Family History  Problem Relation Age of Onset  . Stroke Mother 22  . Heart attack Father     in 51s  . Deep vein thrombosis Brother     AND PTE  . Diabetes Brother   . Cancer Paternal Aunt      INTESTINAL   . Alzheimer's disease Sister   . Alzheimer's disease Brother   . Tuberculosis Brother   . Aneurysm Brother     cns  . Heart Problems    . COPD Sister     smoking  . Heart attack Sister 68   Social History:  reports that she has never smoked. She has never used smokeless tobacco. She reports that she drinks alcohol. She reports that she does not use illicit drugs.  Allergies:  Allergies  Allergen Reactions  . Percocet [Oxycodone-Acetaminophen] Nausea And Vomiting  . Lansoprazole Other (See Comments)    Induced nausea and possibly headaches    Results for orders placed or performed during the hospital encounter of 05/24/14 (from the past 48 hour(s))  CBC with Differential     Status: Abnormal  Collection Time: 05/24/14 12:00 PM  Result Value Ref Range   WBC 13.9 (H) 4.0 - 10.5 K/uL   RBC 5.03 3.87 - 5.11 MIL/uL   Hemoglobin 15.0 12.0 - 15.0 g/dL   HCT 45.6 36.0 - 46.0 %   MCV 90.7 78.0 - 100.0 fL   MCH 29.8 26.0 - 34.0 pg   MCHC 32.9 30.0 - 36.0 g/dL   RDW 13.0 11.5 - 15.5 %   Platelets 285 150 - 400 K/uL   Neutrophils Relative % 84 (H) 43 - 77 %   Neutro Abs 11.8 (H) 1.7 - 7.7 K/uL   Lymphocytes Relative 9 (L) 12 - 46 %   Lymphs Abs 1.2 0.7 - 4.0 K/uL   Monocytes Relative 6 3 - 12 %   Monocytes Absolute 0.8 0.1 - 1.0 K/uL   Eosinophils Relative 1 0 - 5 %   Eosinophils Absolute 0.1 0.0 - 0.7 K/uL   Basophils Relative 0 0 - 1 %   Basophils Absolute 0.0 0.0 - 0.1 K/uL  Comprehensive metabolic panel     Status: Abnormal   Collection Time: 05/24/14 12:00 PM  Result Value Ref Range    Sodium 136 135 - 145 mmol/L    Comment: Please note change in reference range.   Potassium 3.5 3.5 - 5.1 mmol/L    Comment: Please note change in reference range.   Chloride 103 96 - 112 mEq/L   CO2 26 19 - 32 mmol/L   Glucose, Bld 126 (H) 70 - 99 mg/dL   BUN 20 6 - 23 mg/dL   Creatinine, Ser 0.68 0.50 - 1.10 mg/dL   Calcium 9.7 8.4 - 10.5 mg/dL   Total Protein 6.7 6.0 - 8.3 g/dL   Albumin 3.7 3.5 - 5.2 g/dL   AST 25 0 - 37 U/L   ALT 18 0 - 35 U/L   Alkaline Phosphatase 66 39 - 117 U/L   Total Bilirubin 0.5 0.3 - 1.2 mg/dL   GFR calc non Af Amer 76 (L) >90 mL/min   GFR calc Af Amer 89 (L) >90 mL/min    Comment: (NOTE) The eGFR has been calculated using the CKD EPI equation. This calculation has not been validated in all clinical situations. eGFR's persistently <90 mL/min signify possible Chronic Kidney Disease.    Anion gap 7 5 - 15  Urinalysis, Routine w reflex microscopic     Status: Abnormal   Collection Time: 05/24/14  2:50 PM  Result Value Ref Range   Color, Urine YELLOW YELLOW   APPearance CLEAR CLEAR   Specific Gravity, Urine >1.046 (H) 1.005 - 1.030   pH 7.5 5.0 - 8.0   Glucose, UA NEGATIVE NEGATIVE mg/dL   Hgb urine dipstick NEGATIVE NEGATIVE   Bilirubin Urine NEGATIVE NEGATIVE   Ketones, ur 15 (A) NEGATIVE mg/dL   Protein, ur NEGATIVE NEGATIVE mg/dL   Urobilinogen, UA 0.2 0.0 - 1.0 mg/dL   Nitrite NEGATIVE NEGATIVE   Leukocytes, UA NEGATIVE NEGATIVE    Comment: MICROSCOPIC NOT DONE ON URINES WITH NEGATIVE PROTEIN, BLOOD, LEUKOCYTES, NITRITE, OR GLUCOSE <1000 mg/dL.   Dg Ribs Unilateral W/chest Left  05/24/2014   CLINICAL DATA:  Left-sided chest pain after fall  EXAM: LEFT RIBS AND CHEST - 3+ VIEW  COMPARISON:  Chest radiograph April 20, 2013  FINDINGS: Frontal chest as well as oblique and cone-down lower rib images were obtained. There is no edema or consolidation. Heart size and pulmonary vascularity are normal. Pacemaker leads are attached to the right  atrium and right ventricle. No adenopathy. There are calcified breast implants bilaterally.  There is a fracture of the lateral left seventh rib, in near anatomic alignment. There is also a subtle left lateral ninth rib fracture. No other fractures are apparent. No pneumothorax or effusion.  IMPRESSION: Fractures of the lateral left seventh and ninth ribs. No edema or consolidation. No pneumothorax or effusion.   Electronically Signed   By: Lowella Grip M.D.   On: 05/24/2014 11:12   Ct Head Wo Contrast  05/24/2014   CLINICAL DATA:  Fall, left posterior hematoma  EXAM: CT HEAD WITHOUT CONTRAST  CT CERVICAL SPINE WITHOUT CONTRAST  TECHNIQUE: Multidetector CT imaging of the head and cervical spine was performed following the standard protocol without intravenous contrast. Multiplanar CT image reconstructions of the cervical spine were also generated.  COMPARISON:  08/21/2012  FINDINGS: CT HEAD FINDINGS  No skull fracture is noted. There is left posterior parietal scalp swelling and subcutaneous stranding. Small scalp hematoma axial image 17 measures 2 cm length by 5 mm thickness.  No intracranial hemorrhage, mass effect or midline shift.  No acute infarction. Mild cerebral atrophy. Stable mild periventricular and patchy subcortical chronic white matter disease. No mass lesion is noted on this unenhanced scan.  CT CERVICAL SPINE FINDINGS  Axial images of the cervical spine shows no acute fracture or subluxation. Atherosclerotic calcifications bilateral vertebral arteries are noted. C1-C2 relationship is unremarkable. Computer processed images shows no acute fracture or subluxation. Atherosclerotic calcifications are noted bilateral carotid bifurcation. Bilateral apical scarring. No pneumothorax in visualized lung apices. Computer processed images shows no acute fracture or subluxation. There is minimal disc space flattening with mild posterior spurring at C4-C5 level. Mild disc space flattening with posterior  spurring at C5-C6 level. Disc space flattening with mild anterior and mild posterior spurring at C6-C7 and C7-T1 level. No prevertebral soft tissue swelling. Cervical airway is patent. Diffuse osteopenia.  IMPRESSION: 1. No acute intracranial abnormality. Stable atrophy and chronic white matter disease. There is scalp swelling and small scalp hematoma in left posterior parietal region. 2. No cervical spine acute fracture or subluxation. Diffuse osteopenia. Mild degenerative changes cervical spine as described above. No prevertebral soft tissue swelling. Bilateral apical scarring. No pneumothorax in visualized lung apices.   Electronically Signed   By: Lahoma Crocker M.D.   On: 05/24/2014 10:52   Ct Chest W Contrast  05/24/2014   CLINICAL DATA:  Recent fall with left-sided abdominal pain  EXAM: CT CHEST, ABDOMEN, AND PELVIS WITH CONTRAST  TECHNIQUE: Multidetector CT imaging of the chest, abdomen and pelvis was performed following the standard protocol during bolus administration of intravenous contrast.  CONTRAST:  163m OMNIPAQUE IOHEXOL 300 MG/ML  SOLN  COMPARISON:  Plain film from earlier in the same day  FINDINGS: CT CHEST FINDINGS  The lungs are well aerated bilaterally and demonstrate some minimal dependent atelectatic changes. No focal infiltrate or sizable parenchymal nodule is noted. There are changes consistent with bilateral breast implants with are partially calcified. A left-sided pacemaker is noted entering the subclavian vein. High-grade stenosis/ occlusion of the subclavian vein is noted at the vein entry site of the leads. Significant chest wall collateral flow is noted. The innominate vein on the left and superior vena cava are widely patent.  The thoracic aorta shows calcifications although no aneurysmal dilatation or dissection is seen. The pulmonary artery is visualize shows no large central pulmonary emboli. Mild coronary calcifications are seen. Changes of prior granulomatous disease with  calcified mediastinal  lymph nodes are seen. No sizable lymphadenopathy is noted. The bony structures show mild degenerative change of the thoracic spine. No compression deformities are seen. There are fractures of the sixth, seventh and ninth ribs on the left without complicating factors.  CT ABDOMEN AND PELVIS FINDINGS  The liver is within normal limits. The gallbladder is well distended demonstrating dependent gallstones without obstructive change. The spleen, adrenal glands and pancreas are within normal limits. The kidneys demonstrate a normal enhancement pattern bilaterally. Nonobstructing 5 mm stone is noted in the lower pole of the right kidney best seen on image number 58. Renal cystic changes are noted on the left.  The appendix is not well visualized and may been surgically removed. No inflammatory changes are seen. Mild diverticular change of the colon is noted without diverticulitis. The bladder is partially distended. No pelvic mass lesion is noted. The bony structures show degenerative change of the lumbar spine. A scoliosis concave to the right is noted. No compression deformities are seen. An old fracture of the fifth vertebral transverse process on the right is noted.  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 Significant chest wall collateral flow is noted.  Changes of prior granulomatous disease.  Multiple left rib fractures without complicating factors.  CT of the abdomen and pelvis:  Nonobstructing right renal stone.  Chronic changes as described above.  No acute abnormality is noted.   Electronically Signed   By: Inez Catalina M.D.   On: 05/24/2014 13:28    Review of Systems  Constitutional: Negative for weight loss.  HENT: Negative for ear discharge, ear pain, hearing loss and tinnitus.   Eyes: Negative for blurred vision, double vision, photophobia and pain.   Respiratory: Negative for cough, sputum production and shortness of breath.   Cardiovascular: Positive for chest pain.  Gastrointestinal: Negative for nausea, vomiting and abdominal pain.  Genitourinary: Negative for dysuria, urgency, frequency and flank pain.  Musculoskeletal: Negative for myalgias, back pain, joint pain, falls and neck pain.  Neurological: Negative for dizziness, tingling, sensory change, focal weakness, loss of consciousness and headaches.  Endo/Heme/Allergies: Does not bruise/bleed easily.  Psychiatric/Behavioral: Negative for depression, memory loss and substance abuse. The patient is not nervous/anxious.     Blood pressure 128/66, pulse 64, temperature 97.7 F (36.5 C), temperature source Oral, resp. rate 16, SpO2 98 %. Physical Exam  Vitals reviewed. Constitutional: She appears well-developed and well-nourished. She is cooperative. No distress. Cervical collar and nasal cannula in place.  HENT:  Head: Normocephalic and atraumatic. Head is without raccoon's eyes, without Battle's sign, without abrasion, without contusion and without laceration.  Right Ear: Hearing, tympanic membrane and ear canal normal. No lacerations. No drainage or tenderness. No foreign bodies. Tympanic membrane is not perforated. No hemotympanum.  Left Ear: Hearing, tympanic membrane and ear canal normal. No lacerations. No drainage or tenderness. No foreign bodies. Tympanic membrane is not perforated. No hemotympanum.  Nose: Nose normal. No nose lacerations, sinus tenderness, nasal deformity or nasal septal hematoma. No epistaxis.  Mouth/Throat: Uvula is midline, oropharynx is clear and moist and mucous membranes are normal. No lacerations. No oropharyngeal exudate.  Eyes: Conjunctivae, EOM and lids are normal. Pupils are equal, round, and reactive to light. Right eye exhibits no discharge. Left eye exhibits no discharge. No scleral icterus.  Neck: Trachea normal and normal range of motion. Neck  supple. No JVD present. No spinous process tenderness and  no muscular tenderness present. Carotid bruit is not present. No tracheal deviation present. No thyromegaly present.  Cardiovascular: Normal rate, regular rhythm, normal heart sounds, intact distal pulses and normal pulses.  Exam reveals no gallop and no friction rub.   No murmur heard. Respiratory: Effort normal and breath sounds normal. No stridor. No respiratory distress. She has no wheezes. She has no rales. She exhibits tenderness. She exhibits no bony tenderness, no laceration and no crepitus.  GI: Soft. Normal appearance and bowel sounds are normal. She exhibits no distension. There is no tenderness. There is no rigidity, no rebound, no guarding and no CVA tenderness.  Musculoskeletal: Normal range of motion. She exhibits no edema or tenderness.  Lymphadenopathy:    She has no cervical adenopathy.  Neurological: She is alert. She has normal strength. No cranial nerve deficit or sensory deficit. GCS eye subscore is 4. GCS verbal subscore is 5. GCS motor subscore is 6.  Skin: Skin is warm, dry and intact. She is not diaphoretic.  Psychiatric: She has a normal mood and affect. Her speech is normal and behavior is normal.     Assessment/Plan Fall Multiple left rib fxs -- Will admit to trauma service for pain control and pulmonary toilet. Avoid narcotics if possible. Admit to SDU given number of fractures and age. PT/OT. Discussed possibility of need of intubation and possible eventual trach, pt is willing as long as things are temporary. Multiple medical problems -- Home meds     Lisette Abu, PA-C Pager: [phone] General Trauma PA Pager: [phone] 05/24/2014, 3:49 PM

## 2014-05-24 NOTE — ED Notes (Signed)
Pt and family updated on status. Awaiting for Surgery PA to see patient here then can transfer patient to [facility].

## 2014-05-24 NOTE — ED Notes (Signed)
Pt from home [facility]) independent living via [facility] c/o a slipping and falling today. Hematoma to posterior head and left flank pain. NO LOC and not on blood thinners. She is alert and oriented.

## 2014-05-24 NOTE — ED Notes (Addendum)
MD Redmond Pulling notified that patient will not be transported to [facility] until after 7. Patient and family made aware of status.  Report called to [facility] on [id number].  [facility] called.

## 2014-05-24 NOTE — ED Notes (Signed)
Pt transported To XRAY.

## 2014-05-24 NOTE — ED Notes (Signed)
[facility] at bedside 

## 2014-05-24 NOTE — ED Provider Notes (Signed)
CSN: [id number]     Arrival date & time 05/24/14  0938 History   First MD Initiated Contact with Patient 05/24/14 1023     Chief Complaint  Patient presents with  . Fall  . Flank Pain     (Consider location/radiation/quality/duration/timing/severity/associated sxs/prior Treatment) The history is provided by the patient.  Shelly Ross is a 80 y.o. female hx of dementia here with fall. She finished taking a shower and slipped and fell and hit her head and left ribs. She has left rib pain afterwards.   She is not on any blood thinners. Denies any abdominal pain.   Level V caveat- dementia    Past Medical History  Diagnosis Date  . Chronic low back pain   . Dizziness     some  . Hypertension   . Neuralgia, neuritis, and radiculitis, unspecified   . Degenerative disc disease     CERVICAL SPINE,W/RADICULOPATHY  . GERD (gastroesophageal reflux disease)   . Hyperlipidemia   . Thyroid disease     HYPOTHYROIDISM  . Osteoporosis   . Rib fractures   . Stricture and stenosis of esophagus 2007  . Adenomatous colon polyp 2003  . Premature ventricular contractions 2006  . Hypothyroidism   . Headache(784.0)     not migraines, but a lot of headaches  . Hepatitis     jaundice as child  . SVT (supraventricular tachycardia)     S/P ablation 2005   Past Surgical History  Procedure Laterality Date  . Abdominal hysterectomy      WITH OOPHORECTOMY,? BILATERAL FOR ENDOMETRIOSIS  . Breast enhancement surgery    . G2 p2    . Bladder tacking    . Endovenous ablation saphenous vein w/ laser  07-13-2011    right greater saphenous vein, Dr Mamie Nick  . Endovenous ablation saphenous vein w/ laser  08/2011    L ; Dr Kellie Simmering  . Cataract extraction Bilateral   . Esophageal dilation       X 1  . Colonoscopy    . Cardiac electrophysiology mapping and ablation    . Total knee arthroplasty Right 04/17/2013    Procedure: RIGHT TOTAL KNEE ARTHROPLASTY, CORTISONE INJECTION LEFT KNEE;  Surgeon:  Gearlean Alf, MD;  Location: [facility];  Service: Orthopedics;  Laterality: Right;  . Pacemaker insertion  04/19/13    MDT Adapta L iimplanted by Dr Rayann Heman for mobitz II second degree AV block  . Permanent pacemaker insertion N/A 04/19/2013    Procedure: PERMANENT PACEMAKER INSERTION;  Surgeon: Coralyn Mark, MD;  Location: [place] [facility];  Service: Cardiovascular;  Laterality: N/A;   Family History  Problem Relation Age of Onset  . Stroke Mother 36  . Heart attack Father     in 64s  . Deep vein thrombosis Brother     AND PTE  . Diabetes Brother   . Cancer Paternal Aunt      INTESTINAL   . Alzheimer's disease Sister   . Alzheimer's disease Brother   . Tuberculosis Brother   . Aneurysm Brother     cns  . Heart Problems    . COPD Sister     smoking  . Heart attack Sister 42   History  Substance Use Topics  . Smoking status: Never Smoker   . Smokeless tobacco: Never Used  . Alcohol Use: Yes     Comment: wine, seldom    OB History    No data available     Review of  Systems  Musculoskeletal:       L rib pain   All other systems reviewed and are negative.     Allergies  Percocet and Lansoprazole  Home Medications   Prior to Admission medications   Medication Sig Start Date End Date Taking? Authorizing Provider  alendronate (FOSAMAX) 70 MG tablet Take 70 mg by mouth once a week. Take with a full glass of water on an empty stomach.   Yes Historical Provider, MD  levothyroxine (SYNTHROID, LEVOTHROID) 25 MCG tablet Take 12.5 mcg by mouth daily before breakfast.   Yes Historical Provider, MD  acetaminophen (TYLENOL) 325 MG tablet Take 325-650 mg by mouth every 4 (four) hours as needed for mild pain. 04/21/13   Arlee Muslim, PA-C  aspirin 81 MG tablet Take 81 mg by mouth daily.    Historical Provider, MD  hydrochlorothiazide (HYDRODIURIL) 25 MG tablet Take 12.5 mg by mouth daily.     Historical Provider, MD   BP 115/57 mmHg  Pulse 58  Temp(Src) 97.4 F (36.3 C) (Oral)   Resp 16  SpO2 100% Physical Exam  Constitutional: She is oriented to person, place, and time.  Uncomfortable   HENT:  Head: Normocephalic.  Mouth/Throat: Oropharynx is clear and moist.  Small scalp hematoma   Eyes: Conjunctivae and EOM are normal. Pupils are equal, round, and reactive to light.  Neck: Normal range of motion.  Cardiovascular: Normal rate, regular rhythm and normal heart sounds.   Pulmonary/Chest: Effort normal and breath sounds normal. No respiratory distress. She has no wheezes. She has no rales.  Tenderness L posterior ribs, no obvious bruise   Abdominal: Soft. Bowel sounds are normal. She exhibits no distension. There is no tenderness. There is no rebound and no guarding.  Musculoskeletal: Normal range of motion. She exhibits no edema or tenderness.  Pelvis stable, nl ROM bilateral hips   Neurological: She is alert and oriented to person, place, and time. No cranial nerve deficit. Coordination normal.  Skin: Skin is warm and dry.  Psychiatric: She has a normal mood and affect. Her behavior is normal. Thought content normal.  Nursing note and vitals reviewed.   ED Course  Procedures (including critical care time) Labs Review Labs Reviewed  CBC WITH DIFFERENTIAL - Abnormal; Notable for the following:    WBC 13.9 (*)    Neutrophils Relative % 84 (*)    Neutro Abs 11.8 (*)    Lymphocytes Relative 9 (*)    All other components within normal limits  COMPREHENSIVE METABOLIC PANEL - Abnormal; Notable for the following:    Glucose, Bld 126 (*)    GFR calc non Af Amer 76 (*)    GFR calc Af Amer 89 (*)    All other components within normal limits  URINALYSIS, ROUTINE W REFLEX MICROSCOPIC    Imaging Review Dg Ribs Unilateral W/chest Left  05/24/2014   CLINICAL DATA:  Left-sided chest pain after fall  EXAM: LEFT RIBS AND CHEST - 3+ VIEW  COMPARISON:  Chest radiograph April 20, 2013  FINDINGS: Frontal chest as well as oblique and cone-down lower rib images were  obtained. There is no edema or consolidation. Heart size and pulmonary vascularity are normal. Pacemaker leads are attached to the right atrium and right ventricle. No adenopathy. There are calcified breast implants bilaterally.  There is a fracture of the lateral left seventh rib, in near anatomic alignment. There is also a subtle left lateral ninth rib fracture. No other fractures are apparent. No pneumothorax or effusion.  IMPRESSION: Fractures of the lateral left seventh and ninth ribs. No edema or consolidation. No pneumothorax or effusion.   Electronically Signed   By: Lowella Grip M.D.   On: 05/24/2014 11:12   Ct Head Wo Contrast  05/24/2014   CLINICAL DATA:  Fall, left posterior hematoma  EXAM: CT HEAD WITHOUT CONTRAST  CT CERVICAL SPINE WITHOUT CONTRAST  TECHNIQUE: Multidetector CT imaging of the head and cervical spine was performed following the standard protocol without intravenous contrast. Multiplanar CT image reconstructions of the cervical spine were also generated.  COMPARISON:  08/21/2012  FINDINGS: CT HEAD FINDINGS  No skull fracture is noted. There is left posterior parietal scalp swelling and subcutaneous stranding. Small scalp hematoma axial image 17 measures 2 cm length by 5 mm thickness.  No intracranial hemorrhage, mass effect or midline shift.  No acute infarction. Mild cerebral atrophy. Stable mild periventricular and patchy subcortical chronic white matter disease. No mass lesion is noted on this unenhanced scan.  CT CERVICAL SPINE FINDINGS  Axial images of the cervical spine shows no acute fracture or subluxation. Atherosclerotic calcifications bilateral vertebral arteries are noted. C1-C2 relationship is unremarkable. Computer processed images shows no acute fracture or subluxation. Atherosclerotic calcifications are noted bilateral carotid bifurcation. Bilateral apical scarring. No pneumothorax in visualized lung apices. Computer processed images shows no acute fracture or  subluxation. There is minimal disc space flattening with mild posterior spurring at C4-C5 level. Mild disc space flattening with posterior spurring at C5-C6 level. Disc space flattening with mild anterior and mild posterior spurring at C6-C7 and C7-T1 level. No prevertebral soft tissue swelling. Cervical airway is patent. Diffuse osteopenia.  IMPRESSION: 1. No acute intracranial abnormality. Stable atrophy and chronic white matter disease. There is scalp swelling and small scalp hematoma in left posterior parietal region. 2. No cervical spine acute fracture or subluxation. Diffuse osteopenia. Mild degenerative changes cervical spine as described above. No prevertebral soft tissue swelling. Bilateral apical scarring. No pneumothorax in visualized lung apices.   Electronically Signed   By: Lahoma Crocker M.D.   On: 05/24/2014 10:52   Ct Chest W Contrast  05/24/2014   CLINICAL DATA:  Recent fall with left-sided abdominal pain  EXAM: CT CHEST, ABDOMEN, AND PELVIS WITH CONTRAST  TECHNIQUE: Multidetector CT imaging of the chest, abdomen and pelvis was performed following the standard protocol during bolus administration of intravenous contrast.  CONTRAST:  146mL OMNIPAQUE IOHEXOL 300 MG/ML  SOLN  COMPARISON:  Plain film from earlier in the same day  FINDINGS: CT CHEST FINDINGS  The lungs are well aerated bilaterally and demonstrate some minimal dependent atelectatic changes. No focal infiltrate or sizable parenchymal nodule is noted. There are changes consistent with bilateral breast implants with are partially calcified. A left-sided pacemaker is noted entering the subclavian vein. High-grade stenosis/ occlusion of the subclavian vein is noted at the vein entry site of the leads. Significant chest wall collateral flow is noted. The innominate vein on the left and superior vena cava are widely patent.  The thoracic aorta shows calcifications although no aneurysmal dilatation or dissection is seen. The pulmonary artery is  visualize shows no large central pulmonary emboli. Mild coronary calcifications are seen. Changes of prior granulomatous disease with calcified mediastinal lymph nodes are seen. No sizable lymphadenopathy is noted. The bony structures show mild degenerative change of the thoracic spine. No compression deformities are seen. There are fractures of the sixth, seventh and ninth ribs on the left without complicating factors.  CT ABDOMEN AND PELVIS FINDINGS  The liver is within normal limits. The gallbladder is well distended demonstrating dependent gallstones without obstructive change. The spleen, adrenal glands and pancreas are within normal limits. The kidneys demonstrate a normal enhancement pattern bilaterally. Nonobstructing 5 mm stone is noted in the lower pole of the right kidney best seen on image number 58. Renal cystic changes are noted on the left.  The appendix is not well visualized and may been surgically removed. No inflammatory changes are seen. Mild diverticular change of the colon is noted without diverticulitis. The bladder is partially distended. No pelvic mass lesion is noted. The bony structures show degenerative change of the lumbar spine. A scoliosis concave to the right is noted. No compression deformities are seen. An old fracture of the fifth vertebral transverse process on the right is noted.  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 Significant chest wall collateral flow is noted.  Changes of prior granulomatous disease.  Multiple left rib fractures without complicating factors.  CT of the abdomen and pelvis:  Nonobstructing right renal stone.  Chronic changes as described above.  No acute abnormality is noted.   Electronically Signed   By: Inez Catalina M.D.   On: 05/24/2014 13:28   Ct Cervical Spine Wo Contrast  05/24/2014   CLINICAL DATA:  Fall, left posterior  hematoma  EXAM: CT HEAD WITHOUT CONTRAST  CT CERVICAL SPINE WITHOUT CONTRAST  TECHNIQUE: Multidetector CT imaging of the head and cervical spine was performed following the standard protocol without intravenous contrast. Multiplanar CT image reconstructions of the cervical spine were also generated.  COMPARISON:  08/21/2012  FINDINGS: CT HEAD FINDINGS  No skull fracture is noted. There is left posterior parietal scalp swelling and subcutaneous stranding. Small scalp hematoma axial image 17 measures 2 cm length by 5 mm thickness.  No intracranial hemorrhage, mass effect or midline shift.  No acute infarction. Mild cerebral atrophy. Stable mild periventricular and patchy subcortical chronic white matter disease. No mass lesion is noted on this unenhanced scan.  CT CERVICAL SPINE FINDINGS  Axial images of the cervical spine shows no acute fracture or subluxation. Atherosclerotic calcifications bilateral vertebral arteries are noted. C1-C2 relationship is unremarkable. Computer processed images shows no acute fracture or subluxation. Atherosclerotic calcifications are noted bilateral carotid bifurcation. Bilateral apical scarring. No pneumothorax in visualized lung apices. Computer processed images shows no acute fracture or subluxation. There is minimal disc space flattening with mild posterior spurring at C4-C5 level. Mild disc space flattening with posterior spurring at C5-C6 level. Disc space flattening with mild anterior and mild posterior spurring at C6-C7 and C7-T1 level. No prevertebral soft tissue swelling. Cervical airway is patent. Diffuse osteopenia.  IMPRESSION: 1. No acute intracranial abnormality. Stable atrophy and chronic white matter disease. There is scalp swelling and small scalp hematoma in left posterior parietal region. 2. No cervical spine acute fracture or subluxation. Diffuse osteopenia. Mild degenerative changes cervical spine as described above. No prevertebral soft tissue swelling. Bilateral  apical scarring. No pneumothorax in visualized lung apices.   Electronically Signed   By: Lahoma Crocker M.D.   On: 05/24/2014 10:52   Ct Abdomen Pelvis W Contrast  05/24/2014   CLINICAL DATA:  Recent fall with left-sided abdominal pain  EXAM: CT CHEST, ABDOMEN, AND PELVIS WITH CONTRAST  TECHNIQUE: Multidetector CT imaging of the chest, abdomen and pelvis was performed following the standard protocol during bolus administration of intravenous contrast.  CONTRAST:  165mL OMNIPAQUE IOHEXOL 300 MG/ML  SOLN  COMPARISON:  Plain film from earlier in the same day  FINDINGS: CT CHEST FINDINGS  The lungs are well aerated bilaterally and demonstrate some minimal dependent atelectatic changes. No focal infiltrate or sizable parenchymal nodule is noted. There are changes consistent with bilateral breast implants with are partially calcified. A left-sided pacemaker is noted entering the subclavian vein. High-grade stenosis/ occlusion of the subclavian vein is noted at the vein entry site of the leads. Significant chest wall collateral flow is noted. The innominate vein on the left and superior vena cava are widely patent.  The thoracic aorta shows calcifications although no aneurysmal dilatation or dissection is seen. The pulmonary artery is visualize shows no large central pulmonary emboli. Mild coronary calcifications are seen. Changes of prior granulomatous disease with calcified mediastinal lymph nodes are seen. No sizable lymphadenopathy is noted. The bony structures show mild degenerative change of the thoracic spine. No compression deformities are seen. There are fractures of the sixth, seventh and ninth ribs on the left without complicating factors.  CT ABDOMEN AND PELVIS FINDINGS  The liver is within normal limits. The gallbladder is well distended demonstrating dependent gallstones without obstructive change. The spleen, adrenal glands and pancreas are within normal limits. The kidneys demonstrate a normal enhancement  pattern bilaterally. Nonobstructing 5 mm stone is noted in the lower pole of the right kidney best seen on image number 58. Renal cystic changes are noted on the left.  The appendix is not well visualized and may been surgically removed. No inflammatory changes are seen. Mild diverticular change of the colon is noted without diverticulitis. The bladder is partially distended. No pelvic mass lesion is noted. The bony structures show degenerative change of the lumbar spine. A scoliosis concave to the right is noted. No compression deformities are seen. An old fracture of the fifth vertebral transverse process on the right is noted.  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 Significant chest wall collateral flow is noted.  Changes of prior granulomatous disease.  Multiple left rib fractures without complicating factors.  CT of the abdomen and pelvis:  Nonobstructing right renal stone.  Chronic changes as described above.  No acute abnormality is noted.   Electronically Signed   By: Inez Catalina M.D.   On: 05/24/2014 13:28     EKG Interpretation None      MDM   Final diagnoses:  Fall  Trauma    Shelly Ross is a 80 y.o. female here with fall. Will get CT head/neck. Will get rib xrays.    1140 AM Still has pain despite meds. Xray showed rib fractures. Will order CT.   2:27 PM CT showed multiple L rib fractures. Unable to walk due to pain. Will admit to trauma for pain control.   2:51 PM Dr. Lucia Gaskins called Dr. Redmond Pulling from trauma since he will be going to OR for another case. Will transfer to ED at [facility] and have trauma see her there. Dr. Aline Brochure accepting doctor in the ED.     Wandra Arthurs, MD 05/24/14 [phone]

## 2014-05-24 NOTE — Progress Notes (Signed)
CSW met with patient at bedside. Patient confirms that she stays at [facility]. Patient says that she fell in her bathroom today. Patient informed CSW that she now has 3 broke ribs. Patient says that prior to coming to the ED she has been able to complete her ADL's independently.   Willette Brace [phone] ED CSW 05/24/2014 5:07 PM

## 2014-05-24 NOTE — ED Notes (Signed)
[facility] report they are on their way.

## 2014-05-25 LAB — BASIC METABOLIC PANEL
Anion gap: 8 (ref 5–15)
BUN: 11 mg/dL (ref 6–23)
CALCIUM: 9.6 mg/dL (ref 8.4–10.5)
CO2: 28 mmol/L (ref 19–32)
Chloride: 101 mEq/L (ref 96–112)
Creatinine, Ser: 0.66 mg/dL (ref 0.50–1.10)
GFR calc Af Amer: 89 mL/min — ABNORMAL LOW (ref >90)
GFR, EST NON AFRICAN AMERICAN: 77 mL/min — AB (ref >90)
GLUCOSE: 125 mg/dL — AB (ref 70–99)
Potassium: 3.3 mmol/L — ABNORMAL LOW (ref 3.5–5.1)
SODIUM: 137 mmol/L (ref 135–145)

## 2014-05-25 LAB — CBC
HCT: 42.9 % (ref 36.0–46.0)
HEMOGLOBIN: 13.8 g/dL (ref 12.0–15.0)
MCH: 28.8 pg (ref 26.0–34.0)
MCHC: 32.2 g/dL (ref 30.0–36.0)
MCV: 89.6 fL (ref 78.0–100.0)
PLATELETS: 300 10*3/uL (ref 150–400)
RBC: 4.79 MIL/uL (ref 3.87–5.11)
RDW: 13 % (ref 11.5–15.5)
WBC: 7.1 10*3/uL (ref 4.0–10.5)

## 2014-05-25 MED ORDER — PNEUMOCOCCAL VAC POLYVALENT 25 MCG/0.5ML IJ INJ
0.5000 mL | INJECTION | INTRAMUSCULAR | Status: AC
  Administered 2014-05-26: 0.5 mL via INTRAMUSCULAR

## 2014-05-25 NOTE — Progress Notes (Signed)
Report called to Erlene Quan, RN on [employer]. Pt is alert and oriented, VSS, pain med given for left rib pain, personal belongings with pt. Elink and CCMD notified of transfer. Pt transferred to 6N11 via bed.

## 2014-05-25 NOTE — Progress Notes (Signed)
Patient ID: Shelly Ross, female   DOB: 09-16-1924, 80 y.o.   MRN: [id number]   LOS: 1 day   Subjective: C/o chest wall pain as expected.   Objective: Vital signs in last 24 hours: Temp:  [97.4 F (36.3 C)-98.7 F (37.1 C)] 98.6 F (37 C) (12/25 0700) Pulse Rate:  [58-70] 70 (12/25 0432) Resp:  [13-17] 16 (12/25 0432) BP: (108-152)/(49-71) 140/53 mmHg (12/25 0432) SpO2:  [92 %-100 %] 92 % (12/25 0432) Weight:  [127 lb 6.8 oz (57.8 kg)] 127 lb 6.8 oz (57.8 kg) (12/24 1930)    IS: 1046ml   Laboratory  CBC  Recent Labs  05/24/14 1200 05/25/14 0400  WBC 13.9* 7.1  HGB 15.0 13.8  HCT 45.6 42.9  PLT 285 300   BMET  Recent Labs  05/24/14 1200 05/25/14 0400  NA 136 137  K 3.5 3.3*  CL 103 101  CO2 26 28  GLUCOSE 126* 125*  BUN 20 11  CREATININE 0.68 0.66  CALCIUM 9.7 9.6    Physical Exam General appearance: alert and no distress Resp: clear to auscultation bilaterally, maybe slightly decreased on left Cardio: regular rate and rhythm   Assessment/Plan: Fall Multiple left rib fxs -- Pulmonary toilet Multiple medical problems -- Home meds FEN -- No issues VTE -- SCD's, Lovenox Dispo -- PT/OT consults    Lisette Abu, PA-C Pager: [phone] General Trauma PA Pager: [phone]  05/25/2014

## 2014-05-26 ENCOUNTER — Inpatient Hospital Stay (HOSPITAL_COMMUNITY): Payer: Medicare Other

## 2014-05-26 DIAGNOSIS — S2242XA Multiple fractures of ribs, left side, initial encounter for closed fracture: Secondary | ICD-10-CM | POA: Diagnosis not present

## 2014-05-26 NOTE — Evaluation (Signed)
Physical Therapy Evaluation Patient Details Name: Shelly Ross MRN: [id number] DOB: [birth year] Today's Date: 05/26/2014   History of Present Illness  Shelly Ross is an 80 y.o Female admitted 05/24/14 after a fall at home resulting in multiple rib fxs. PMH: chronic low back pain, dizziness, HTN, osteoporosis, HL, premature ventricular contrations, SVT (s/p ablation 2005). Pt is s/p R TKA (04/2013) and pacemaker insertion (04/2013).   Clinical Impression  Pt admitted with above diagnosis. Pt currently with functional limitations due to the deficits listed below (see PT Problem List).  Pt will benefit from skilled PT to increase their independence and safety with mobility to allow discharge to the venue listed below.  Pt prefers ST SNF at [facility] home.     Follow Up Recommendations SNF;Supervision/Assistance - 24 hour    Equipment Recommendations  None recommended by PT    Recommendations for Other Services       Precautions / Restrictions Precautions Precautions: Fall Precaution Comments: rib fxs; R shoulder deficits; L shoulder pain (pending x-ray) Restrictions Weight Bearing Restrictions: No      Mobility  Bed Mobility Overal bed mobility: Needs Assistance Bed Mobility: Rolling;Sidelying to Sit Rolling: Min assist Sidelying to sit: Min assist;HOB elevated       General bed mobility comments: HOB at 20 degrees  Transfers Overall transfer level: Needs assistance Equipment used: Rolling [employer]bold (2 wheeled) Transfers: Sit to/from [facility] Sit to Stand: Min assist Stand pivot transfers: Min assist       General transfer comment: Min (A) to power up to standing.  Ambulation/Gait Ambulation/Gait assistance: Min guard Ambulation Distance (Feet): 200 Feet Assistive device: Rolling [place] (2 wheeled) Gait Pattern/deviations: Step-through pattern;Decreased stride length Gait velocity: decreased      Stairs            Wheelchair Mobility    Modified Rankin (Stroke Patients Only)       Balance                                             Pertinent Vitals/Pain Pain Assessment: Faces Faces Pain Scale: Hurts even more Pain Location: L flank and abdomen Pain Descriptors / Indicators: Aching;Stabbing;Tightness;Grimacing Pain Intervention(s): Monitored during session    Home Living Family/patient expects to be discharged to:: Private residence Living Arrangements: Alone Available Help at Discharge: Family;Available PRN/intermittently Type of Home: House Home Access: Level entry     Home Layout: One level Home Equipment: Fults - 4 wheels;Cane - single point;Tub bench      Prior Function Level of Independence: Independent         Comments: pt still drives and has someone who comes to clean every times a week. Pt goes to the hall for evening meals.      Hand Dominance   Dominant Hand: Right    Extremity/Trunk Assessment   Upper Extremity Assessment: Defer to OT evaluation RUE Deficits / Details: Pt reports that she hurt her R shoulder and "needs a reverse TSA." R shoulder pops and grinds during movement and pt with limited FF (about 30*). Pt keeps it close to body.  RUE: Unable to fully assess due to pain   LUE Deficits / Details: Pt reports pain in L shoulder (scapular) and is awaiting x-ray results. Pt has full ROM but has pain.    Lower Extremity Assessment: Overall WFL for tasks assessed  Cervical / Trunk Assessment: Normal  Communication   Communication: HOH  Cognition Arousal/Alertness: Awake/alert Behavior During Therapy: WFL for tasks assessed/performed Overall Cognitive Status: Within Functional Limits for tasks assessed       Memory: Decreased short-term memory              General Comments      Exercises        Assessment/Plan    PT Assessment Patient needs continued PT services  PT Diagnosis Difficulty walking;Acute pain   PT Problem List  Decreased activity tolerance;Decreased mobility;Decreased balance;Pain  PT Treatment Interventions DME instruction;Gait training;Functional mobility training;Therapeutic activities;Therapeutic exercise;Patient/family education;Balance training   PT Goals (Current goals can be found in the Care Plan section) Acute Rehab PT Goals Patient Stated Goal: to go back home PT Goal Formulation: With patient Time For Goal Achievement: 06/09/14 Potential to Achieve Goals: Good    Frequency Min 2X/week   Barriers to discharge Decreased caregiver support      Co-evaluation               End of Session Equipment Utilized During Treatment: Gait belt Activity Tolerance: Patient tolerated treatment well Patient left: in bed;with family/visitor present;with call bell/phone within reach Nurse Communication: Mobility status         Time: 1287-8676 PT Time Calculation (min) (ACUTE ONLY): 27 min   Charges:   PT Evaluation $Initial PT Evaluation Tier I: 1 Procedure PT Treatments $Gait Training: 23-37 mins   PT G Codes:        Shelly Ross 05/26/2014, 4:30 PM

## 2014-05-26 NOTE — Progress Notes (Signed)
Occupational Therapy Evaluation Patient Details Name: Shelly Ross MRN: [id number] DOB: 1924-07-16 Today's Date: 05/26/2014    History of Present Illness Shelly Ross is an 80 y.o Female admitted 05/24/14 after a fall at home resulting in multiple rib fxs. PMH: chronic low back pain, dizziness, HTN, osteoporosis, HL, premature ventricular contrations, SVT (s/p ablation 2005). Pt is s/p R TKA (04/2013) and pacemaker insertion (04/2013).    Clinical Impression   PTA pt lived in independent living at [facility] and was independent with ADLs. Pt currently limited by R shoulder limitations and L trunk and shoulder pain (rib fxs) which impair her independence with ADLs. Pt would benefit from ST Rehab at SNF (prefers to stay at [facility]). Pt will also benefit from acute OT to address functional mobility and functional UE use.     Follow Up Recommendations  SNF;Supervision/Assistance - 24 hour (rehab at [facility])    Equipment Recommendations  3 in 1 bedside comode    Recommendations for Other Services       Precautions / Restrictions Precautions Precautions: Fall Precaution Comments: rib fxs; R shoulder deficits; L shoulder pain (pending x-ray) Restrictions Weight Bearing Restrictions: No      Mobility Bed Mobility Overal bed mobility: Needs Assistance Bed Mobility: Rolling;Sidelying to Sit Rolling: Min assist Sidelying to sit: Min assist       General bed mobility comments: HOB flat, min (A) for bed mobility due to pain. Increased time required.   Transfers Overall transfer level: Needs assistance Equipment used: 1 person hand held assist Transfers: Sit to/from Stand Sit to Stand: Min assist         General transfer comment: Min (A) to power up to standing and to balance. HHA to ambulate to recliner.          ADL Overall ADL's : Needs assistance/impaired Eating/Feeding: Independent;Sitting   Grooming: Set up;Sitting   Upper Body Bathing: Set  up;Sitting   Lower Body Bathing: Minimal assistance;Sit to/from stand   Upper Body Dressing : Set up;Sitting Upper Body Dressing Details (indicate cue type and reason): difficulty due to L side and L shoulder pain and R shoulder deficits, but able to do on her own Lower Body Dressing: Minimal assistance;Sit to/from stand Lower Body Dressing Details (indicate cue type and reason): pt able to reach Bil LEs through figure four method  Toilet Transfer: Minimal assistance;Ambulation (1 person hand held assist)           Functional mobility during ADLs: Minimal assistance (1 person hand held assist) General ADL Comments: Pt limited by previous R shoulder deficits in addition to L sided trunk and shoulder pain. Pt requires min (A) to stand and to perform LB ADLs. Pt has increased pain and difficulty with UB ADLs and requires increased time.      Vision  Pt reports no change from baseline. Vision screen [facility].                    Perception Perception Perception Tested?: No   Praxis Praxis Praxis tested?: Within functional limits    Pertinent Vitals/Pain Pain Assessment: Faces Faces Pain Scale: Hurts even more Pain Location: L side (ribs) and L shoulder Pain Descriptors / Indicators: Aching;Stabbing;Tightness;Grimacing Pain Intervention(s): Limited activity within patient's tolerance;Monitored during session;Repositioned     Hand Dominance Right   Extremity/Trunk Assessment Upper Extremity Assessment Upper Extremity Assessment: RUE deficits/detail;LUE deficits/detail RUE Deficits / Details: Pt reports that she hurt her R shoulder and "needs a reverse TSA." R shoulder  pops and grinds during movement and pt with limited FF (about 30*). Pt keeps it close to body.  RUE: Unable to fully assess due to pain LUE Deficits / Details: Pt reports pain in L shoulder (scapular) and is awaiting x-ray results. Pt has full ROM but has pain.  LUE: Unable to fully assess due to pain   Lower  Extremity Assessment Lower Extremity Assessment: Defer to PT evaluation   Cervical / Trunk Assessment Cervical / Trunk Assessment: Normal   Communication Communication Communication: HOH   Cognition Arousal/Alertness: Awake/alert Behavior During Therapy: WFL for tasks assessed/performed Overall Cognitive Status: Within Functional Limits for tasks assessed       Memory: Decreased short-term memory                        Home Living Family/patient expects to be discharged to:: Private residence [facility] home) Living Arrangements: Alone Available Help at Discharge: Family;Available PRN/intermittently Type of Home: House Home Access: Level entry     Home Layout: One level     Bathroom Shower/Tub: Occupational psychologist: Standard     Home Equipment: Environmental consultant - 4 wheels;Cane - single point;Tub bench          Prior Functioning/Environment Level of Independence: Independent        Comments: pt still drives and has someone who comes to clean every times a week. Pt goes to the hall for evening meals.     OT Diagnosis: Generalized weakness;Acute pain   OT Problem List: Decreased strength;Decreased range of motion;Decreased activity tolerance;Impaired balance (sitting and/or standing);Decreased safety awareness;Decreased knowledge of use of DME or AE;Decreased knowledge of precautions;Impaired UE functional use;Pain   OT Treatment/Interventions: Self-care/ADL training;Therapeutic exercise;Energy conservation;DME and/or AE instruction;Therapeutic activities;Patient/family education;Balance training    OT Goals(Current goals can be found in the care plan section) Acute Rehab OT Goals Patient Stated Goal: to go back home OT Goal Formulation: With patient Time For Goal Achievement: 06/09/14 Potential to Achieve Goals: Good ADL Goals Pt Will Perform Upper Body Bathing: sitting;with modified independence Pt Will Perform Lower Body Bathing: with modified  independence;sit to/from stand Pt Will Perform Upper Body Dressing: with modified independence;sitting Pt Will Perform Lower Body Dressing: with modified independence;sit to/from stand Pt Will Transfer to Toilet: with modified independence;ambulating Pt Will Perform Toileting - Clothing Manipulation and hygiene: with modified independence;sit to/from stand  OT Frequency: Min 2X/week    End of Session Equipment Utilized During Treatment: Gait belt  Activity Tolerance: Patient tolerated treatment well Patient left: in chair;with call bell/phone within reach;with family/visitor present   Time: 1204-1237 OT Time Calculation (min): 33 min Charges:  OT General Charges $OT Visit: 1 Procedure OT Evaluation $Initial OT Evaluation Tier I: 1 Procedure OT Treatments $Self Care/Home Management : 23-37 mins G-Codes:    Villa Herb M 01-Jun-2014, 2:09 PM  Cyndie Chime, OTR/L Occupational Therapist [phone] (pager)

## 2014-05-26 NOTE — Progress Notes (Signed)
Subjective: Alert and stable. Complains of left posterior shoulder pain. Denies weakness or sensory change in left hand. Vital signs stable. SPO2 96% room air.  Objective: Vital signs in last 24 hours: Temp:  [97.8 F (36.6 C)-98.3 F (36.8 C)] 97.8 F (36.6 C) (12/26 0648) Pulse Rate:  [60-70] 60 (12/26 0648) Resp:  [15-16] 15 (12/26 0648) BP: (105-139)/(53-72) 128/58 mmHg (12/26 0648) SpO2:  [95 %-99 %] 96 % (12/26 0648) Last BM Date: 05/24/14  Intake/Output from previous day: 12/25 0701 - 12/26 0700 In: 203 [P.O.:200; I.V.:3] Out: 700 [Urine:700] Intake/Output this shift:    General appearance: Alert. Pleasant. Alert cooperative. Mental status normal. Minimal distress. Resp: Lungs are basically clear. I was able to encourage her to demonstrate a vital capacity of 1500 mL. Extremities: Painful to raise left shoulder above 100. Humerus nontender. Humeral head nontender. Tender to palpate posteriorly behind humeral head in scapular area.  Lab Results:   Recent Labs  05/24/14 1200 05/25/14 0400  WBC 13.9* 7.1  HGB 15.0 13.8  HCT 45.6 42.9  PLT 285 300   BMET  Recent Labs  05/24/14 1200 05/25/14 0400  NA 136 137  K 3.5 3.3*  CL 103 101  CO2 26 28  GLUCOSE 126* 125*  BUN 20 11  CREATININE 0.68 0.66  CALCIUM 9.7 9.6   PT/INR No results for input(s): LABPROT, INR in the last 72 hours. ABG No results for input(s): PHART, HCO3 in the last 72 hours.  Invalid input(s): PCO2, PO2  Studies/Results: Dg Ribs Unilateral W/chest Left  05/24/2014   CLINICAL DATA:  Left-sided chest pain after fall  EXAM: LEFT RIBS AND CHEST - 3+ VIEW  COMPARISON:  Chest radiograph April 20, 2013  FINDINGS: Frontal chest as well as oblique and cone-down lower rib images were obtained. There is no edema or consolidation. Heart size and pulmonary vascularity are normal. Pacemaker leads are attached to the right atrium and right ventricle. No adenopathy. There are calcified breast  implants bilaterally.  There is a fracture of the lateral left seventh rib, in near anatomic alignment. There is also a subtle left lateral ninth rib fracture. No other fractures are apparent. No pneumothorax or effusion.  IMPRESSION: Fractures of the lateral left seventh and ninth ribs. No edema or consolidation. No pneumothorax or effusion.   Electronically Signed   By: Lowella Grip M.D.   On: 05/24/2014 11:12   Ct Head Wo Contrast  05/24/2014   CLINICAL DATA:  Fall, left posterior hematoma  EXAM: CT HEAD WITHOUT CONTRAST  CT CERVICAL SPINE WITHOUT CONTRAST  TECHNIQUE: Multidetector CT imaging of the head and cervical spine was performed following the standard protocol without intravenous contrast. Multiplanar CT image reconstructions of the cervical spine were also generated.  COMPARISON:  08/21/2012  FINDINGS: CT HEAD FINDINGS  No skull fracture is noted. There is left posterior parietal scalp swelling and subcutaneous stranding. Small scalp hematoma axial image 17 measures 2 cm length by 5 mm thickness.  No intracranial hemorrhage, mass effect or midline shift.  No acute infarction. Mild cerebral atrophy. Stable mild periventricular and patchy subcortical chronic white matter disease. No mass lesion is noted on this unenhanced scan.  CT CERVICAL SPINE FINDINGS  Axial images of the cervical spine shows no acute fracture or subluxation. Atherosclerotic calcifications bilateral vertebral arteries are noted. C1-C2 relationship is unremarkable. Computer processed images shows no acute fracture or subluxation. Atherosclerotic calcifications are noted bilateral carotid bifurcation. Bilateral apical scarring. No pneumothorax in visualized lung apices. Teaching laboratory technician  processed images shows no acute fracture or subluxation. There is minimal disc space flattening with mild posterior spurring at C4-C5 level. Mild disc space flattening with posterior spurring at C5-C6 level. Disc space flattening with mild anterior and  mild posterior spurring at C6-C7 and C7-T1 level. No prevertebral soft tissue swelling. Cervical airway is patent. Diffuse osteopenia.  IMPRESSION: 1. No acute intracranial abnormality. Stable atrophy and chronic white matter disease. There is scalp swelling and small scalp hematoma in left posterior parietal region. 2. No cervical spine acute fracture or subluxation. Diffuse osteopenia. Mild degenerative changes cervical spine as described above. No prevertebral soft tissue swelling. Bilateral apical scarring. No pneumothorax in visualized lung apices.   Electronically Signed   By: Lahoma Crocker M.D.   On: 05/24/2014 10:52   Ct Chest W Contrast  05/24/2014   CLINICAL DATA:  Recent fall with left-sided abdominal pain  EXAM: CT CHEST, ABDOMEN, AND PELVIS WITH CONTRAST  TECHNIQUE: Multidetector CT imaging of the chest, abdomen and pelvis was performed following the standard protocol during bolus administration of intravenous contrast.  CONTRAST:  113mL OMNIPAQUE IOHEXOL 300 MG/ML  SOLN  COMPARISON:  Plain film from earlier in the same day  FINDINGS: CT CHEST FINDINGS  The lungs are well aerated bilaterally and demonstrate some minimal dependent atelectatic changes. No focal infiltrate or sizable parenchymal nodule is noted. There are changes consistent with bilateral breast implants with are partially calcified. A left-sided pacemaker is noted entering the subclavian vein. High-grade stenosis/ occlusion of the subclavian vein is noted at the vein entry site of the leads. Significant chest wall collateral flow is noted. The innominate vein on the left and superior vena cava are widely patent.  The thoracic aorta shows calcifications although no aneurysmal dilatation or dissection is seen. The pulmonary artery is visualize shows no large central pulmonary emboli. Mild coronary calcifications are seen. Changes of prior granulomatous disease with calcified mediastinal lymph nodes are seen. No sizable lymphadenopathy is  noted. The bony structures show mild degenerative change of the thoracic spine. No compression deformities are seen. There are fractures of the sixth, seventh and ninth ribs on the left without complicating factors.  CT ABDOMEN AND PELVIS FINDINGS  The liver is within normal limits. The gallbladder is well distended demonstrating dependent gallstones without obstructive change. The spleen, adrenal glands and pancreas are within normal limits. The kidneys demonstrate a normal enhancement pattern bilaterally. Nonobstructing 5 mm stone is noted in the lower pole of the right kidney best seen on image number 58. Renal cystic changes are noted on the left.  The appendix is not well visualized and may been surgically removed. No inflammatory changes are seen. Mild diverticular change of the colon is noted without diverticulitis. The bladder is partially distended. No pelvic mass lesion is noted. The bony structures show degenerative change of the lumbar spine. A scoliosis concave to the right is noted. No compression deformities are seen. An old fracture of the fifth vertebral transverse process on the right is noted.  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 Significant chest wall collateral flow is noted.  Changes of prior granulomatous disease.  Multiple left rib fractures without complicating factors.  CT of the abdomen and pelvis:  Nonobstructing right renal stone.  Chronic changes as described above.  No acute abnormality is noted.   Electronically Signed   By: Inez Catalina M.D.   On: 05/24/2014  13:28   Ct Cervical Spine Wo Contrast  05/24/2014   CLINICAL DATA:  Fall, left posterior hematoma  EXAM: CT HEAD WITHOUT CONTRAST  CT CERVICAL SPINE WITHOUT CONTRAST  TECHNIQUE: Multidetector CT imaging of the head and cervical spine was performed following the standard protocol without intravenous  contrast. Multiplanar CT image reconstructions of the cervical spine were also generated.  COMPARISON:  08/21/2012  FINDINGS: CT HEAD FINDINGS  No skull fracture is noted. There is left posterior parietal scalp swelling and subcutaneous stranding. Small scalp hematoma axial image 17 measures 2 cm length by 5 mm thickness.  No intracranial hemorrhage, mass effect or midline shift.  No acute infarction. Mild cerebral atrophy. Stable mild periventricular and patchy subcortical chronic white matter disease. No mass lesion is noted on this unenhanced scan.  CT CERVICAL SPINE FINDINGS  Axial images of the cervical spine shows no acute fracture or subluxation. Atherosclerotic calcifications bilateral vertebral arteries are noted. C1-C2 relationship is unremarkable. Computer processed images shows no acute fracture or subluxation. Atherosclerotic calcifications are noted bilateral carotid bifurcation. Bilateral apical scarring. No pneumothorax in visualized lung apices. Computer processed images shows no acute fracture or subluxation. There is minimal disc space flattening with mild posterior spurring at C4-C5 level. Mild disc space flattening with posterior spurring at C5-C6 level. Disc space flattening with mild anterior and mild posterior spurring at C6-C7 and C7-T1 level. No prevertebral soft tissue swelling. Cervical airway is patent. Diffuse osteopenia.  IMPRESSION: 1. No acute intracranial abnormality. Stable atrophy and chronic white matter disease. There is scalp swelling and small scalp hematoma in left posterior parietal region. 2. No cervical spine acute fracture or subluxation. Diffuse osteopenia. Mild degenerative changes cervical spine as described above. No prevertebral soft tissue swelling. Bilateral apical scarring. No pneumothorax in visualized lung apices.   Electronically Signed   By: Lahoma Crocker M.D.   On: 05/24/2014 10:52   Ct Abdomen Pelvis W Contrast  05/24/2014   CLINICAL DATA:  Recent fall with  left-sided abdominal pain  EXAM: CT CHEST, ABDOMEN, AND PELVIS WITH CONTRAST  TECHNIQUE: Multidetector CT imaging of the chest, abdomen and pelvis was performed following the standard protocol during bolus administration of intravenous contrast.  CONTRAST:  122mL OMNIPAQUE IOHEXOL 300 MG/ML  SOLN  COMPARISON:  Plain film from earlier in the same day  FINDINGS: CT CHEST FINDINGS  The lungs are well aerated bilaterally and demonstrate some minimal dependent atelectatic changes. No focal infiltrate or sizable parenchymal nodule is noted. There are changes consistent with bilateral breast implants with are partially calcified. A left-sided pacemaker is noted entering the subclavian vein. High-grade stenosis/ occlusion of the subclavian vein is noted at the vein entry site of the leads. Significant chest wall collateral flow is noted. The innominate vein on the left and superior vena cava are widely patent.  The thoracic aorta shows calcifications although no aneurysmal dilatation or dissection is seen. The pulmonary artery is visualize shows no large central pulmonary emboli. Mild coronary calcifications are seen. Changes of prior granulomatous disease with calcified mediastinal lymph nodes are seen. No sizable lymphadenopathy is noted. The bony structures show mild degenerative change of the thoracic spine. No compression deformities are seen. There are fractures of the sixth, seventh and ninth ribs on the left without complicating factors.  CT ABDOMEN AND PELVIS FINDINGS  The liver is within normal limits. The gallbladder is well distended demonstrating dependent gallstones without obstructive change. The spleen, adrenal glands and pancreas are within normal limits. The kidneys demonstrate  a normal enhancement pattern bilaterally. Nonobstructing 5 mm stone is noted in the lower pole of the right kidney best seen on image number 58. Renal cystic changes are noted on the left.  The appendix is not well visualized and  may been surgically removed. No inflammatory changes are seen. Mild diverticular change of the colon is noted without diverticulitis. The bladder is partially distended. No pelvic mass lesion is noted. The bony structures show degenerative change of the lumbar spine. A scoliosis concave to the right is noted. No compression deformities are seen. An old fracture of the fifth vertebral transverse process on the right is noted.  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 Significant chest wall collateral flow is noted.  Changes of prior granulomatous disease.  Multiple left rib fractures without complicating factors.  CT of the abdomen and pelvis:  Nonobstructing right renal stone.  Chronic changes as described above.  No acute abnormality is noted.   Electronically Signed   By: Inez Catalina M.D.   On: 05/24/2014 13:28    Anti-infectives: Anti-infectives    None      Assessment/Plan:  Ground-level fall.  fracture left seventh and ninth left ribs. No evidence of pulmonary compromise Left posterior shoulder pain. Needs further evaluation. Left shoulder and left scapular x-rays ordered Multiple medical problems, continue home meds VTE prophylaxis--SCDs and Lovenox Disposition. She lives at [facility] home independent living. Await PT and OT consults to help guide whether she should go back to SNF or not.  hopefully discharge tomorrow.    LOS: 2 days    Andree Heeg M 05/26/2014

## 2014-05-27 MED ORDER — POLYETHYLENE GLYCOL 3350 17 G PO PACK
17.0000 g | PACK | Freq: Two times a day (BID) | ORAL | Status: AC
  Administered 2014-05-27 – 2014-05-28 (×2): 17 g via ORAL
  Filled 2014-05-27 (×2): qty 1

## 2014-05-27 NOTE — Progress Notes (Signed)
Subjective: Stable and alert. Tolerating diet. Constipation. Limited movement of left shoulder. Still has pain in left chest wall and left shoulder. Chest x-ray shows small left pleural effusion. Multiple left rib fractures. Basilar atelectasis Humerus, scapula, and clavicle are intact.  Objective: Vital signs in last 24 hours: Temp:  [97.9 F (36.6 C)-98.4 F (36.9 C)] 98.4 F (36.9 C) (12/27 0653) Pulse Rate:  [63-69] 63 (12/27 0653) Resp:  [15-16] 15 (12/27 0653) BP: (99-152)/(58-70) 99/58 mmHg (12/27 0653) SpO2:  [96 %-100 %] 96 % (12/27 0653) Last BM Date: 05/25/14  Intake/Output from previous day: 12/26 0701 - 12/27 0700 In: 903 [P.O.:900; I.V.:3] Out: 300 [Urine:300] Intake/Output this shift: Total I/O In: 120 [P.O.:120] Out: -    EXAM: General appearance: Alert. Pleasant. Alert cooperative. Mental status normal. Minimal distress. Resp: Lungs are basically clear. Extremities: Painful to raise left shoulder above 100. Humerus nontender. Humeral head nontender. Tender to palpate posteriorly behind humeral head in scapular area.   Lab Results:   Recent Labs  05/24/14 1200 05/25/14 0400  WBC 13.9* 7.1  HGB 15.0 13.8  HCT 45.6 42.9  PLT 285 300   BMET  Recent Labs  05/24/14 1200 05/25/14 0400  NA 136 137  K 3.5 3.3*  CL 103 101  CO2 26 28  GLUCOSE 126* 125*  BUN 20 11  CREATININE 0.68 0.66  CALCIUM 9.7 9.6   PT/INR No results for input(s): LABPROT, INR in the last 72 hours. ABG No results for input(s): PHART, HCO3 in the last 72 hours.  Invalid input(s): PCO2, PO2  Studies/Results: Dg Chest 2 View  05/26/2014   CLINICAL DATA:  Shortness of breath since falling backward at home several days ago, inferior LEFT scapular pain, LEFT rib fractures, hypertension, pacemaker  EXAM: CHEST  2 VIEW  COMPARISON:  05/24/2014  FINDINGS: LEFT subclavian transvenous pacemaker leads project at LEFT atrium and LEFT ventricle.  Normal heart size, mediastinal  contours, and pulmonary vascularity.  Atherosclerotic calcification aorta.  LEFT basilar atelectasis.  Multiple lateral LEFT rib fractures.  Small LEFT pleural effusion.  No pneumothorax.  Bones demineralized with thoracolumbar scoliosis.  IMPRESSION: Multiple LEFT rib fractures with LEFT pleural effusion and basilar atelectasis.  Osseous demineralization.   Electronically Signed   By: Lavonia Dana M.D.   On: 05/26/2014 12:57   Dg Scapula Left  05/26/2014   CLINICAL DATA:  [place] backwards at home several days ago, LEFT rib fractures, LEFT inferior scapular pain  EXAM: LEFT SCAPULA - 2+ VIEWS  COMPARISON:  None  FINDINGS: Portion of scapula obscured by pacemaker generator.  Multiple LEFT rib fractures including at least the LEFT third fourth fifth sixth and seventh ribs.  No scapular fracture identified.  Glenohumeral and acromioclavicular joint alignments normal.  IMPRESSION: Multiple LEFT rib fractures and osseous demineralization.  No acute LEFT scapular abnormalities.   Electronically Signed   By: Lavonia Dana M.D.   On: 05/26/2014 13:03   Dg Shoulder Left  05/26/2014   CLINICAL DATA:  Fall several days ago  EXAM: LEFT SHOULDER - 2+ VIEW  COMPARISON:  None.  FINDINGS: The humerus, scapula, and clavicle are intact. Innumerable left-sided rib fractures are noted.  IMPRESSION: Innumerable left-sided rib fractures. Left humerus and scapula are intact.   Electronically Signed   By: Maryclare Bean M.D.   On: 05/26/2014 13:03    Anti-infectives: Anti-infectives    None      Assessment/Plan:    Ground-level fall. Numerous left rib fractures. The rib fractures and  chest wall contusion account for all of her pain.. No evidence of pulmonary compromise  X-ray of left shoulder, clavicle, and scapula are negative  Multiple medical problems, continue home meds VTE prophylaxis--SCDs and Lovenox Disposition. She lives at [facility] home independent living. PT and OT both recommend skilled nursing. Because this is  the weekend this will need to wait until tomorrow.  Plan discharge tomorrow and transfer to SNF at [facility].    LOS: 3 days    Shelly Ross M 05/27/2014

## 2014-05-28 DIAGNOSIS — W19XXXA Unspecified fall, initial encounter: Secondary | ICD-10-CM | POA: Diagnosis present

## 2014-05-28 MED ORDER — TRAMADOL HCL 50 MG PO TABS: 50.0000 mg | ORAL_TABLET | Freq: Four times a day (QID) | ORAL | Status: DC | PRN

## 2014-05-28 NOTE — Progress Notes (Signed)
Responded to spiritual care consult to visited with patient. Patient requested prayer.  I prayed with patient and provided emotional and spiritual support.  Patient said she was in a lot of pain especially in her rib area.  Patient is being discharged today to [facility] center. Blessings for continued healings were offered prior to release.   05/28/14 1000  Clinical Encounter Type  Visited With Patient;Health care provider  Visit Type Initial;Follow-up;Spiritual support  Referral From Nurse;Physician  Spiritual Encounters  Spiritual Needs Prayer;Emotional  Stress Factors  Patient Stress Factors None identified  Jaclynn Major, [place]

## 2014-05-28 NOTE — Discharge Summary (Signed)
Physician Discharge Summary  Patient ID: Shelly Ross MRN: [id number] DOB/AGE: 07/22/1924 80 y.o.  Admit date: 05/24/2014 Discharge date: 05/28/2014  Discharge Diagnoses Patient Active Problem List   Diagnosis Date Noted  . Fall 05/28/2014  . Multiple rib fractures 05/24/2014  . Hyponatremia 04/21/2013  . Postoperative anemia due to acute blood loss 04/21/2013  . Hypokalemia 04/21/2013  . AV block, 2nd degree 04/18/2013  . OA (osteoarthritis) of knee 04/17/2013  . DJD (degenerative joint disease) of knee 02/15/2013  . Diarrhea 02/10/2013  . Hypercalcemia 01/17/2013  . Memory loss 09/09/2012  . Varicose veins of lower extremities with other complications 53/66/4403  . ANXIETY 07/23/2010  . DEGENERATIVE DISC DISEASE, LUMBAR SPINE 07/09/2009  . EPISTAXIS 07/09/2009  . OSTEOPENIA 02/08/2009  . PALPITATIONS, RECURRENT 10/22/2008  . HYPERTENSION 01/12/2008  . PREMATURE VENTRICULAR CONTRACTIONS 01/12/2008  . DIZZINESS 01/12/2008  . HYPOTHYROIDISM 07/18/2007  . HYPERLIPIDEMIA 07/18/2007  . GERD 07/18/2007  . DEGENERATIVE DISC DISEASE, CERVICAL SPINE, W/RADICULOPATHY 07/18/2007    Consultants None   Procedures None   HPI: Shelly Ross lives at [facility]. She was getting out of the shower and fell backwards striking her left side. She denied presyncope or syncope or loss of consciousness. She had trouble breathing and moving and came to the ED at [facility] for evaluation. Workup showed multiple left rib fractures and trauma was asked to admit. She was transferred to [facility] for further care.   Hospital Course: The patient's hospital course was unremarkable. She did not suffer any respiratory compromise from her rib fractures. She was evaluated by physical and occupational therapies who recommended skilled nursing facility placement at discharge. Once a bed becomes available she will be discharged to the skilled nursing facility at [facility] in good condition.       Medication List    TAKE these medications        acetaminophen 325 MG tablet  Commonly known as:  TYLENOL  Take 325-650 mg by mouth every 4 (four) hours as needed for mild pain.     aspirin 81 MG tablet  Take 81 mg by mouth daily as needed (headache).     estradiol 0.1 MG/GM vaginal cream  Commonly known as:  ESTRACE  Place 1 Applicatorful vaginally once a week. Monday or Tuesday each week     hydrochlorothiazide 25 MG tablet  Commonly known as:  HYDRODIURIL  Take 12.5 mg by mouth daily as needed (high blood pressure (over 180)).     levothyroxine 75 MCG tablet  Commonly known as:  SYNTHROID, LEVOTHROID  Take 37.5-75 mcg by mouth daily before breakfast. Take 1/2 tablet (37.5 mcg) every morning except Wednesday, take 1 tablet (75 mcg) on Wednesday     multivitamin with minerals Tabs tablet  Take 1 tablet by mouth daily with lunch.     rivastigmine 9.5 mg/24hr  Commonly known as:  EXELON  Place 9.5 mg onto the skin daily.     SYSTANE OP  Place 1 drop into both eyes 2 (two) times daily.     traMADol 50 MG tablet  Commonly known as:  ULTRAM  Take 1-2 tablets (50-100 mg total) by mouth every 6 (six) hours as needed (50mg  for mild pain, 75mg  for moderate pain, 100mg  for severe pain).             Follow-up Information    Follow up with Unice Cobble, MD. Schedule an appointment as soon as possible for a visit in 2 weeks.   Specialty:  Internal  Medicine   Contact information:   [address] [address] [phone]       Follow up with [facility].   Why:  As needed   Contact information:   [address] [place] [address] [phone]       Signed: Lisette Abu, PA-C Pager: [phone] General Trauma PA Pager: [phone] 05/28/2014, 8:21 AM

## 2014-05-28 NOTE — Progress Notes (Signed)
Discharge to SNF, [facility] . [facility] transport patient to SNF. Daughter with patient and will meet patient in [facility]. Patient is alert and oriented, not in any distress.

## 2014-05-28 NOTE — Progress Notes (Signed)
Patient ID: Shelly Ross, female   DOB: [birth year], 80 y.o.   MRN: [id number]   LOS: 4 days   Subjective: C/o pain but no changes.   Objective: Vital signs in last 24 hours: Temp:  [98.3 F (36.8 C)-98.9 F (37.2 C)] 98.9 F (37.2 C) (12/28 0533) Pulse Rate:  [64-68] 64 (12/28 0533) Resp:  [15] 15 (12/28 0533) BP: (109-123)/(52-54) 109/52 mmHg (12/28 0533) SpO2:  [93 %-94 %] 93 % (12/28 0533) Last BM Date: 05/25/14   Physical Exam General appearance: alert and no distress Resp: clear to auscultation bilaterally Cardio: regular rate and rhythm GI: normal findings: bowel sounds normal and soft, non-tender   Assessment/Plan: Fall Multiple left rib fxs -- Pulmonary toilet Multiple medical problems -- Home meds Dispo -- D/C to SNF at [facility] today   Lisette Abu, PA-C Pager: [phone] General Trauma PA Pager: [phone]  05/28/2014

## 2014-05-28 NOTE — Clinical Social Work Note (Signed)
[place] number [phone]

## 2014-05-28 NOTE — Clinical Social Work Note (Addendum)
Patient will discharge to [facility] SNF Anticipated discharge date:05/28/14 Family notified:patients family at bedside- informed of DC by RN Transportation by [employer]- scheduled for 2:30pm   Patient from [facility] independent living and transferred to skilled portion for rehab. Full assessment and placement note to follow.  CSW signing off.  Domenica Reamer, [place] Social Worker [phone]

## 2014-05-28 NOTE — Progress Notes (Signed)
Called [facility] but just got receptionist voice mail.

## 2014-05-29 ENCOUNTER — Telehealth: Payer: Self-pay | Admitting: *Deleted

## 2014-05-29 NOTE — Telephone Encounter (Signed)
Called pt to set up TCM appt no answer LMOM RTC.../lmb 

## 2014-05-29 NOTE — Telephone Encounter (Signed)
Daughter called back to say her mother has new PCP, Dr Felipa Eth at [facility].

## 2014-05-29 NOTE — Telephone Encounter (Signed)
Noted, Dr. Linna Darner from pcp...Johny Chess

## 2014-07-26 ENCOUNTER — Ambulatory Visit (INDEPENDENT_AMBULATORY_CARE_PROVIDER_SITE_OTHER): Payer: Medicare Other | Admitting: Internal Medicine

## 2014-07-26 ENCOUNTER — Encounter: Payer: Self-pay | Admitting: Internal Medicine

## 2014-07-26 VITALS — BP 140/82 | HR 84 | Ht 61.5 in | Wt 126.0 lb

## 2014-07-26 DIAGNOSIS — I10 Essential (primary) hypertension: Secondary | ICD-10-CM

## 2014-07-26 DIAGNOSIS — I441 Atrioventricular block, second degree: Secondary | ICD-10-CM

## 2014-07-26 LAB — MDC_IDC_ENUM_SESS_TYPE_INCLINIC
Battery Impedance: 100 Ohm
Battery Remaining Longevity: 147 mo
Brady Statistic AP VP Percent: 22 %
Lead Channel Impedance Value: 716 Ohm
Lead Channel Impedance Value: 788 Ohm
Lead Channel Pacing Threshold Amplitude: 0.75 V
Lead Channel Pacing Threshold Pulse Width: 0.4 ms
Lead Channel Sensing Intrinsic Amplitude: 31 mV
Lead Channel Setting Pacing Amplitude: 2 V
Lead Channel Setting Pacing Amplitude: 2.5 V
Lead Channel Setting Sensing Sensitivity: 5.6 mV
MDC IDC MSMT BATTERY VOLTAGE: 2.79 V
MDC IDC MSMT LEADCHNL RA PACING THRESHOLD PULSEWIDTH: 0.4 ms
MDC IDC MSMT LEADCHNL RA SENSING INTR AMPL: 2.8 mV
MDC IDC MSMT LEADCHNL RV PACING THRESHOLD AMPLITUDE: 0.5 V
MDC IDC SESS DTM: 20160225130812
MDC IDC SET LEADCHNL RV PACING PULSEWIDTH: 0.4 ms
MDC IDC STAT BRADY AP VS PERCENT: 3 %
MDC IDC STAT BRADY AS VP PERCENT: 50 %
MDC IDC STAT BRADY AS VS PERCENT: 24 %

## 2014-07-26 NOTE — Patient Instructions (Signed)

## 2014-07-26 NOTE — Progress Notes (Signed)
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 female who presents today for routine electrophysiology followup.  She is recovering slowly from a recent mechanical fall.  Today, she denies symptoms of palpitations, chest pain, shortness of breath,  lower extremity edema, dizziness, presyncope, or syncope.  The patient is otherwise without complaint today.   Past Medical History  Diagnosis Date  . Chronic low back pain   . Dizziness     some  . Hypertension   . Neuralgia, neuritis, and radiculitis, unspecified   . Degenerative disc disease     CERVICAL SPINE,W/RADICULOPATHY  . GERD (gastroesophageal reflux disease)   . Hyperlipidemia   . Thyroid disease     HYPOTHYROIDISM  . Osteoporosis   . Rib fractures   . Stricture and stenosis of esophagus 2007  . Adenomatous colon polyp 2003  . Premature ventricular contractions 2006  . Hypothyroidism   . Headache(784.0)     not migraines, but a lot of headaches  . Hepatitis     jaundice as child  . SVT (supraventricular tachycardia)     S/P ablation 2005   Past Surgical History  Procedure Laterality Date  . Abdominal hysterectomy      WITH OOPHORECTOMY,? BILATERAL FOR ENDOMETRIOSIS  . Breast enhancement surgery    . G2 p2    . Bladder tacking    . Endovenous ablation saphenous vein w/ laser  07-13-2011    right greater saphenous vein, Dr Mamie Nick  . Endovenous ablation saphenous vein w/ laser  08/2011    L ; Dr Kellie Simmering  . Cataract extraction Bilateral   . Esophageal dilation       X 1  . Colonoscopy    . Cardiac electrophysiology mapping and ablation    . Total knee arthroplasty Right 04/17/2013    Procedure: RIGHT TOTAL KNEE ARTHROPLASTY, CORTISONE INJECTION LEFT KNEE;  Surgeon: Gearlean Alf, MD;  Location: [facility];  Service: Orthopedics;  Laterality: Right;  . Pacemaker insertion  04/19/13    MDT Adapta L iimplanted by Dr Rayann Heman for mobitz II second degree AV block  . Permanent pacemaker insertion N/A 04/19/2013   Procedure: PERMANENT PACEMAKER INSERTION;  Surgeon: Coralyn Mark, MD;  Location: [facility];  Service: Cardiovascular;  Laterality: N/A;    Current Outpatient Prescriptions  Medication Sig Dispense Refill  . acetaminophen (TYLENOL) 325 MG tablet Take 325-650 mg by mouth every 4 (four) hours as needed for mild pain.    Marland Kitchen aspirin 81 MG tablet Take 81 mg by mouth daily.     Marland Kitchen atorvastatin (LIPITOR) 20 MG tablet Take 10 mg by mouth daily.    . hydrochlorothiazide (HYDRODIURIL) 25 MG tablet Take 12.5 mg by mouth daily as needed (high blood pressure (over 180)).     Marland Kitchen levothyroxine (SYNTHROID, LEVOTHROID) 75 MCG tablet Take 37.5-75 mcg by mouth daily before breakfast. Take 1/2 tablet (37.5 mcg) every morning except Wednesday, take 1 tablet (75 mcg) on Wednesday  2  . Polyethyl Glycol-Propyl Glycol (SYSTANE OP) Place 1 drop into both eyes 2 (two) times daily.    . rivastigmine (EXELON) 9.5 mg/24hr Place 9.5 mg onto the skin daily.   3  . traMADol (ULTRAM) 50 MG tablet Take 1-2 tablets (50-100 mg total) by mouth every 6 (six) hours as needed (50mg  for mild pain, 75mg  for moderate pain, 100mg  for severe pain). 24 tablet 0  . estradiol (ESTRACE) 0.1 MG/GM vaginal cream Place 1 Applicatorful vaginally once a week. Monday or  Tuesday each week     No current facility-administered medications for this visit.    Physical Exam: Filed Vitals:   07/26/14 1132  BP: 140/82  Pulse: 84  Height: 5' 1.5" (1.562 m)  Weight: 126 lb (57.153 kg)    GEN- The patient is well appearing, alert and oriented x 3 today.   Head- normocephalic, atraumatic Eyes-  Sclera clear, conjunctiva pink Ears- hearing intact Oropharynx- clear Lungs- Clear to ausculation bilaterally, normal work of breathing Chest- pacemaker pocket is well healed Heart- Regular rate and rhythm, no murmurs, rubs or gallops, PMI not laterally displaced GI- soft, NT, ND, + BS Extremities- no clubbing, cyanosis, or edema  Pacemaker  interrogation- reviewed in detail today,  See PACEART report  Assessment and Plan:  1. Mobitz II second degree AV block Normal pacemaker function See Pace Art report No changes today  2. HTN Stable No change required today  3. Fall Given recent mechanical fall, will stop ASA as risks may outweigh benefit at this time  carelink Return to see me in 12 months

## 2014-08-17 ENCOUNTER — Encounter: Payer: Self-pay | Admitting: Internal Medicine

## 2014-09-12 ENCOUNTER — Other Ambulatory Visit: Payer: Self-pay | Admitting: Internal Medicine

## 2014-09-12 ENCOUNTER — Ambulatory Visit
Admission: RE | Admit: 2014-09-12 | Discharge: 2014-09-12 | Disposition: A | Discharge status: Home / Self Care | Payer: Medicare Other | Source: Ambulatory Visit | Attending: Internal Medicine | Admitting: Internal Medicine

## 2014-09-12 DIAGNOSIS — M25531 Pain in right wrist: Secondary | ICD-10-CM

## 2014-10-24 ENCOUNTER — Telehealth: Payer: Self-pay | Admitting: *Deleted

## 2014-10-24 NOTE — Telephone Encounter (Signed)
Pt's son calling for instructions on how to send remote transmission. Stayed on phone the duration of transmission to confirm a successful send. Pt is appreciative.

## 2014-10-25 ENCOUNTER — Ambulatory Visit (INDEPENDENT_AMBULATORY_CARE_PROVIDER_SITE_OTHER): Payer: Medicare Other | Admitting: *Deleted

## 2014-10-25 DIAGNOSIS — I441 Atrioventricular block, second degree: Secondary | ICD-10-CM | POA: Diagnosis not present

## 2014-10-25 NOTE — Progress Notes (Signed)
Remote pacemaker transmission.   

## 2014-10-31 LAB — CUP PACEART REMOTE DEVICE CHECK
Battery Remaining Longevity: 143 mo
Brady Statistic AP VP Percent: 26 %
Brady Statistic AS VS Percent: 5 %
Date Time Interrogation Session: 20160525160538
Lead Channel Impedance Value: 678 Ohm
Lead Channel Pacing Threshold Amplitude: 0.625 V
Lead Channel Pacing Threshold Amplitude: 0.75 V
Lead Channel Pacing Threshold Pulse Width: 0.4 ms
Lead Channel Sensing Intrinsic Amplitude: 1 mV
MDC IDC MSMT BATTERY IMPEDANCE: 100 Ohm
MDC IDC MSMT BATTERY VOLTAGE: 2.79 V
MDC IDC MSMT LEADCHNL RA PACING THRESHOLD PULSEWIDTH: 0.4 ms
MDC IDC MSMT LEADCHNL RV IMPEDANCE VALUE: 807 Ohm
MDC IDC SET LEADCHNL RA PACING AMPLITUDE: 2 V
MDC IDC SET LEADCHNL RV PACING AMPLITUDE: 2.5 V
MDC IDC SET LEADCHNL RV PACING PULSEWIDTH: 0.4 ms
MDC IDC SET LEADCHNL RV SENSING SENSITIVITY: 5.6 mV
MDC IDC STAT BRADY AP VS PERCENT: 1 %
MDC IDC STAT BRADY AS VP PERCENT: 69 %

## 2014-11-07 ENCOUNTER — Encounter: Payer: Self-pay | Admitting: Cardiology

## 2014-11-07 IMAGING — CT CT HEAD W/O CM
2 series · 16 of 30 positions shown, 20 images · non-contrast
Comparison: None.

CLINICAL DATA: Right-sided numbness

CT HEAD WITHOUT CONTRAST
TECHNIQUE: Contiguous axial images were obtained from the base of
the skull through the vertex without contrast.

[Series 2: head w/o · axial · non-contrast · 0.43mm/px · z∈[+166,+286]mm · 13 of 30 slices shown, 17 images]
[im 3/30  brain]
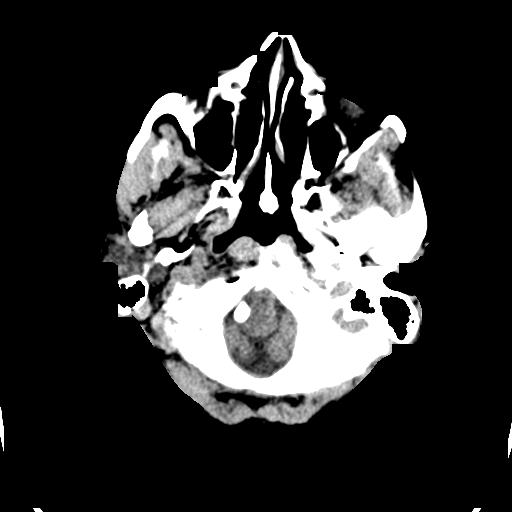
[im 3/30  bone]
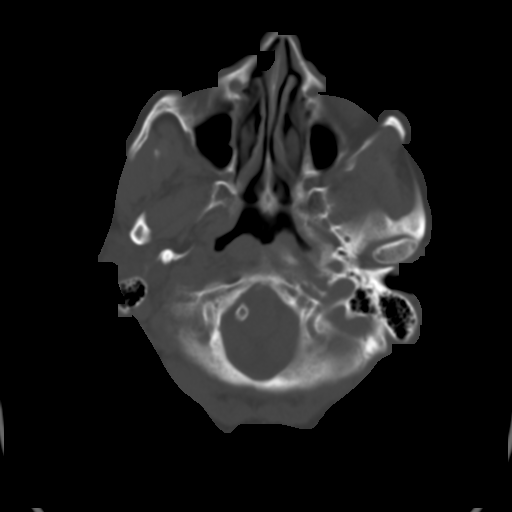
[im 5/30  brain]
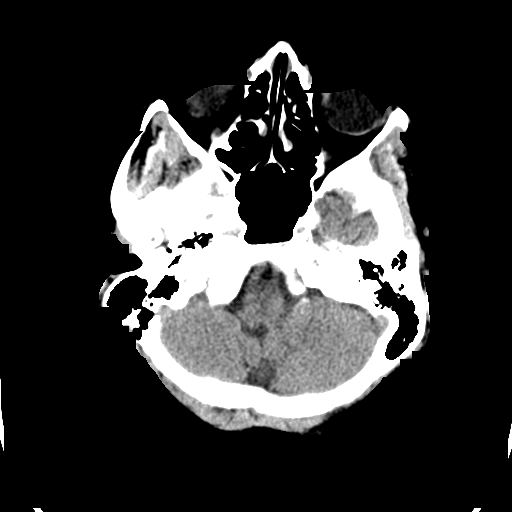
[im 7/30  brain]
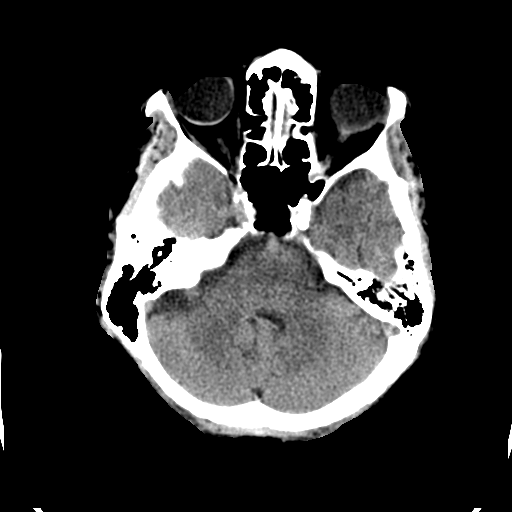
[im 9/30  brain]
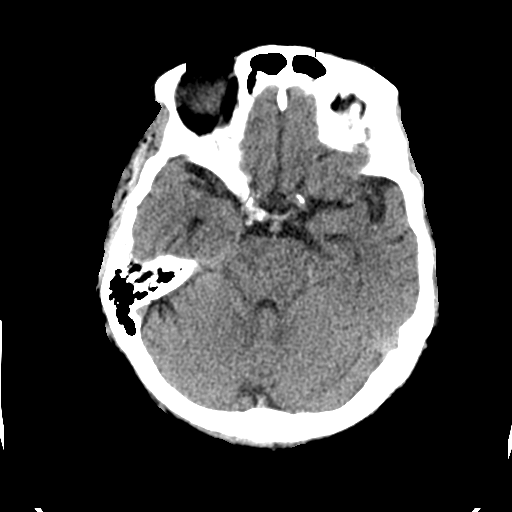
[im 11/30  brain]
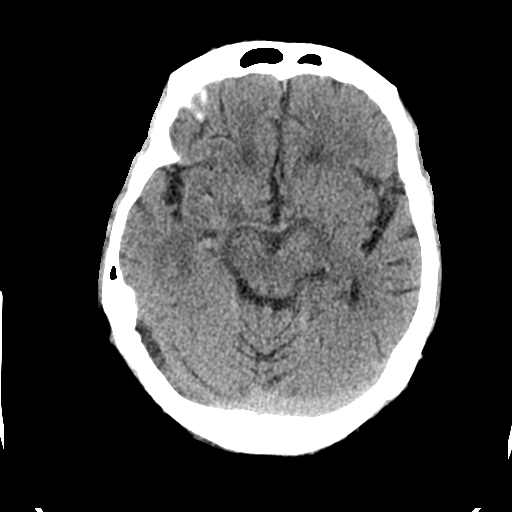
[im 11/30  bone]
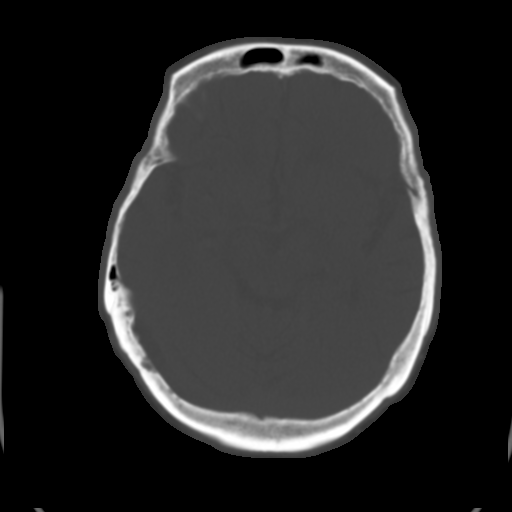
[im 13/30  brain]
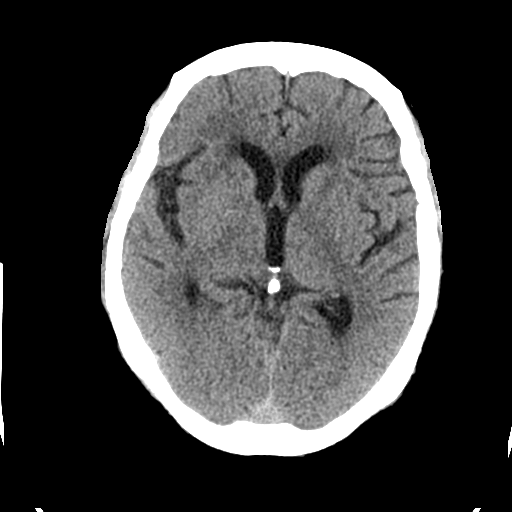
[im 15/30  brain]
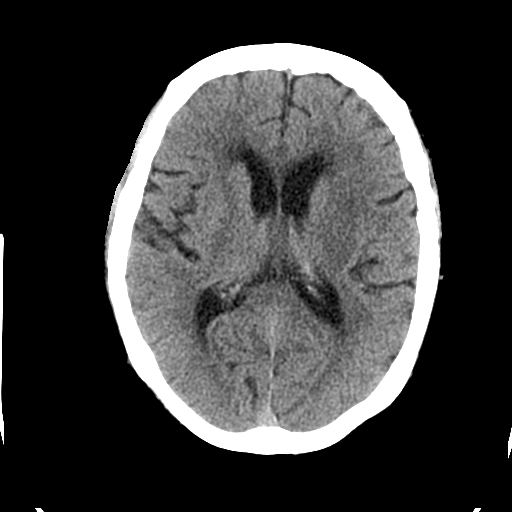
[im 17/30  brain]
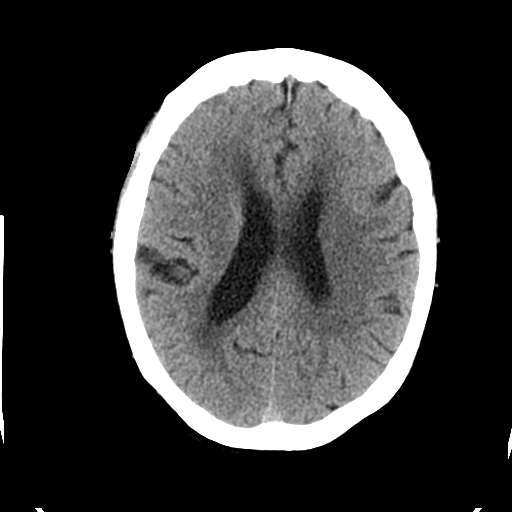
[im 19/30  brain]
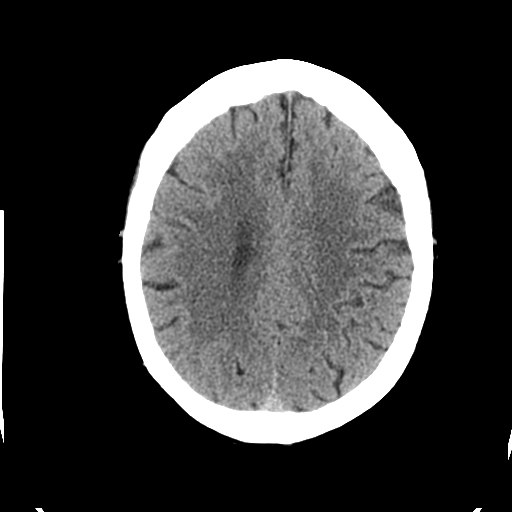
[im 19/30  bone]
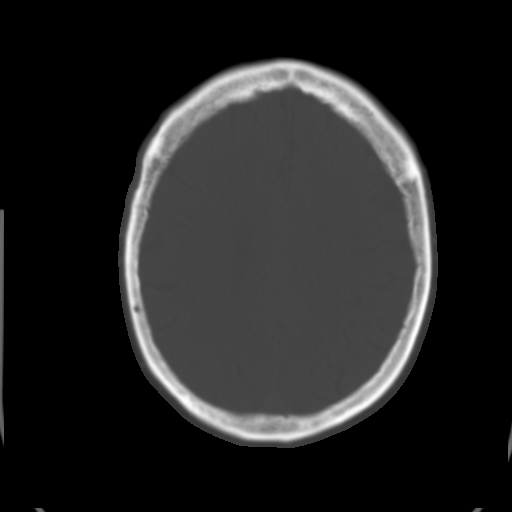
[im 21/30  brain]
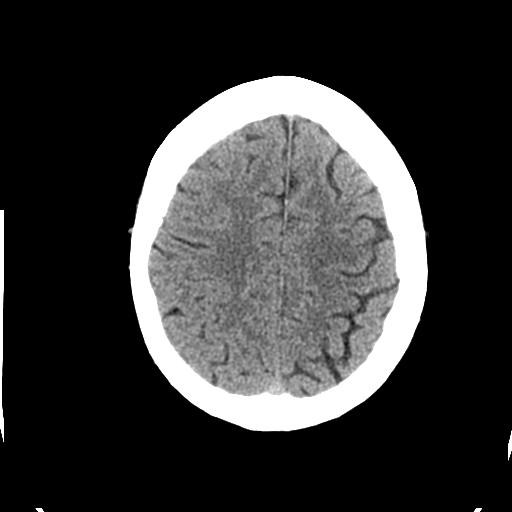
[im 23/30  brain]
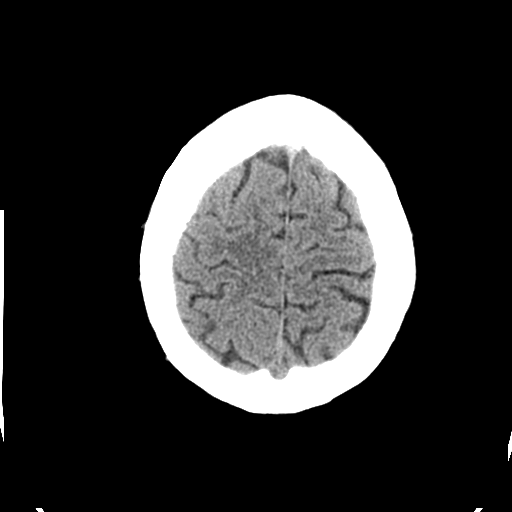
[im 25/30  brain]
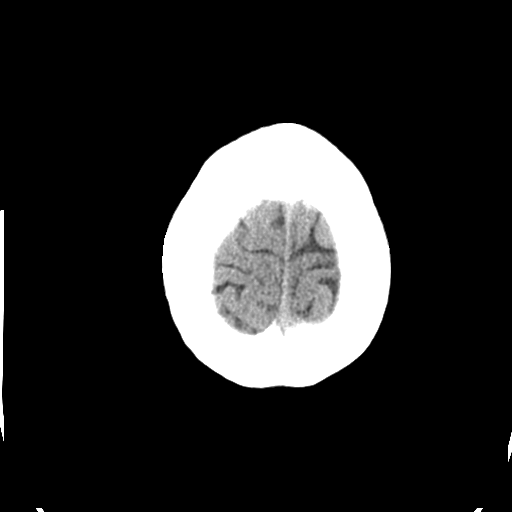
[im 27/30  brain]
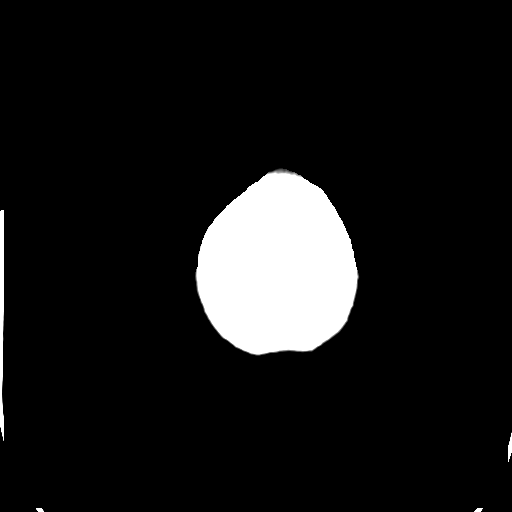
[im 27/30  bone]
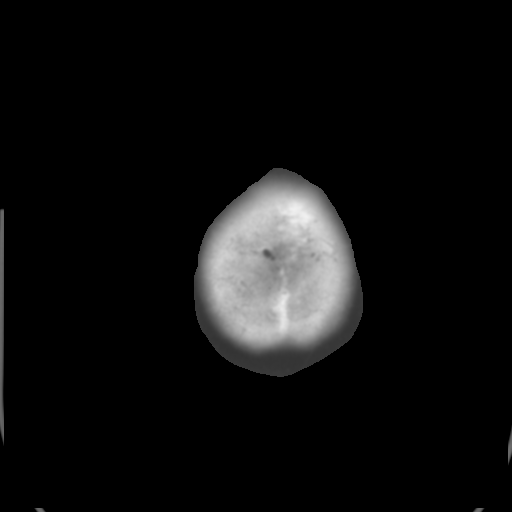

[Series 3: bone windows · axial · 0.43mm/px · z∈[+166,+206]mm · 3 of 30 slices shown]
[im 3/30  bone]
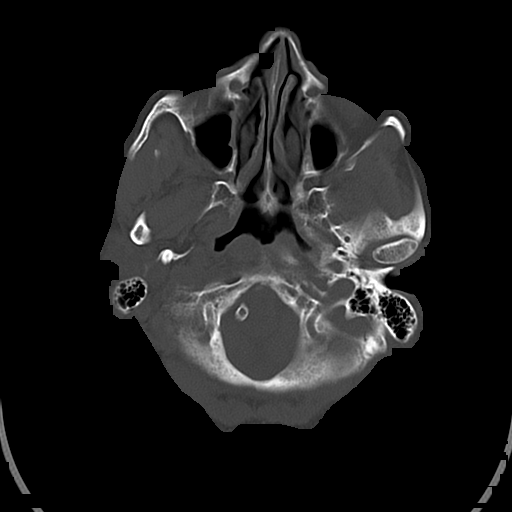
[im 7/30  bone]
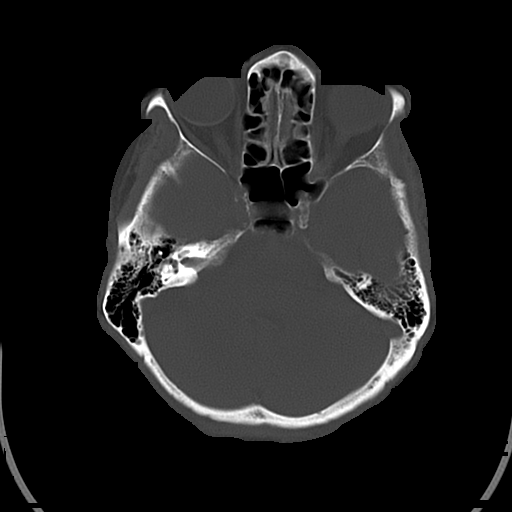
[im 11/30  bone]
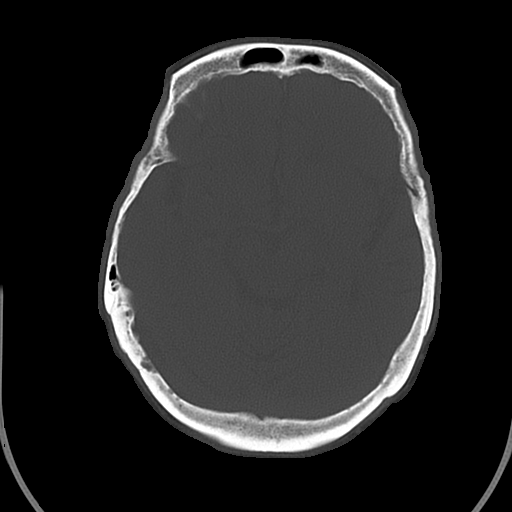

[16 of 30 positions shown; findings below may reference images not displayed]

FINDINGS: No acute intracranial hemorrhage.  No focal mass lesion.
No CT evidence of acute infarction.   No midline shift or mass
effect.  No hydrocephalus.  Basilar cisterns are patent.  There is
mild periventricular subcortical white matter hypodensities.  Mild
cortical atrophy.

Paranasal sinuses and mastoid air cells are clear.  Orbits are
normal.
IMPRESSION: 1.  No acute intracranial findings.

2 . Atrophy and microvascular disease.

## 2014-11-14 ENCOUNTER — Encounter: Payer: Self-pay | Admitting: Internal Medicine

## 2014-11-21 ENCOUNTER — Encounter: Payer: Self-pay | Admitting: Cardiology

## 2015-01-24 ENCOUNTER — Telehealth: Payer: Self-pay | Admitting: Cardiology

## 2015-01-24 ENCOUNTER — Telehealth: Payer: Self-pay | Admitting: Internal Medicine

## 2015-01-24 ENCOUNTER — Ambulatory Visit (INDEPENDENT_AMBULATORY_CARE_PROVIDER_SITE_OTHER): Payer: Medicare Other | Admitting: *Deleted

## 2015-01-24 ENCOUNTER — Encounter: Payer: Self-pay | Admitting: Internal Medicine

## 2015-01-24 DIAGNOSIS — I441 Atrioventricular block, second degree: Secondary | ICD-10-CM | POA: Diagnosis not present

## 2015-01-24 NOTE — Telephone Encounter (Signed)
New message   Son called  to see if we received your device transmission?   4. Are you calling to see if we received your device transmission?

## 2015-01-24 NOTE — Progress Notes (Signed)
Remote pacemaker transmission.   

## 2015-01-24 NOTE — Telephone Encounter (Signed)
Spoke with pt and reminded pt of remote transmission that is due today. Pt verbalized understanding.   

## 2015-01-28 LAB — CUP PACEART REMOTE DEVICE CHECK
Battery Impedance: 109 Ohm
Battery Remaining Longevity: 138 mo
Battery Voltage: 2.79 V
Brady Statistic AP VP Percent: 28 %
Date Time Interrogation Session: 20160825151912
Lead Channel Impedance Value: 655 Ohm
Lead Channel Sensing Intrinsic Amplitude: 1.4 mV
Lead Channel Setting Pacing Amplitude: 2 V
Lead Channel Setting Pacing Pulse Width: 0.4 ms
MDC IDC MSMT LEADCHNL RA PACING THRESHOLD AMPLITUDE: 0.75 V
MDC IDC MSMT LEADCHNL RA PACING THRESHOLD PULSEWIDTH: 0.4 ms
MDC IDC MSMT LEADCHNL RV IMPEDANCE VALUE: 765 Ohm
MDC IDC MSMT LEADCHNL RV PACING THRESHOLD AMPLITUDE: 0.625 V
MDC IDC MSMT LEADCHNL RV PACING THRESHOLD PULSEWIDTH: 0.4 ms
MDC IDC SET LEADCHNL RV PACING AMPLITUDE: 2.5 V
MDC IDC SET LEADCHNL RV SENSING SENSITIVITY: 5.6 mV
MDC IDC STAT BRADY AP VS PERCENT: 0 %
MDC IDC STAT BRADY AS VP PERCENT: 69 %
MDC IDC STAT BRADY AS VS PERCENT: 3 %

## 2015-01-28 NOTE — Telephone Encounter (Signed)
Returned patient's son's call.  Explained that remote transmission was received but that it is being processed.  Stated that device status is stable and that we will call Ms. Almendariz if there are any changes or concerns.  Mr. Caprice Beaver appreciative of call and denies any additional questions or concerns, voices understanding of calling if Ms. Pelland has any worsening symptoms.

## 2015-02-01 ENCOUNTER — Encounter: Payer: Self-pay | Admitting: Cardiology

## 2015-02-19 ENCOUNTER — Telehealth: Payer: Self-pay | Admitting: Internal Medicine

## 2015-02-19 NOTE — Telephone Encounter (Signed)
Okay to send last office note with medication list   This is the last list I have

## 2015-02-19 NOTE — Telephone Encounter (Signed)
New Message  Please fax the med list to [phone]  Tarri Glenn is requesting a list of the patients current medications. Please call back to discuss. Message was left with medical records as well. Nurse with [facility] requests to have this message routed to Nurse of [facility] as well.

## 2015-02-28 ENCOUNTER — Emergency Department (HOSPITAL_COMMUNITY)
Admission: EM | Admit: 2015-02-28 | Discharge: 2015-02-28 | Disposition: A | Discharge status: Home / Self Care | Payer: Medicare Other | Attending: Emergency Medicine | Admitting: Emergency Medicine

## 2015-02-28 DIAGNOSIS — R197 Diarrhea, unspecified: Secondary | ICD-10-CM | POA: Insufficient documentation

## 2015-02-28 DIAGNOSIS — R5383 Other fatigue: Secondary | ICD-10-CM | POA: Diagnosis not present

## 2015-02-28 DIAGNOSIS — E039 Hypothyroidism, unspecified: Secondary | ICD-10-CM | POA: Diagnosis not present

## 2015-02-28 DIAGNOSIS — I10 Essential (primary) hypertension: Secondary | ICD-10-CM | POA: Insufficient documentation

## 2015-02-28 DIAGNOSIS — Z86018 Personal history of other benign neoplasm: Secondary | ICD-10-CM | POA: Insufficient documentation

## 2015-02-28 DIAGNOSIS — Z8719 Personal history of other diseases of the digestive system: Secondary | ICD-10-CM | POA: Diagnosis not present

## 2015-02-28 DIAGNOSIS — Z8739 Personal history of other diseases of the musculoskeletal system and connective tissue: Secondary | ICD-10-CM | POA: Diagnosis not present

## 2015-02-28 DIAGNOSIS — G8929 Other chronic pain: Secondary | ICD-10-CM | POA: Insufficient documentation

## 2015-02-28 DIAGNOSIS — Z950 Presence of cardiac pacemaker: Secondary | ICD-10-CM | POA: Diagnosis not present

## 2015-02-28 DIAGNOSIS — R112 Nausea with vomiting, unspecified: Secondary | ICD-10-CM | POA: Diagnosis not present

## 2015-02-28 DIAGNOSIS — Z79899 Other long term (current) drug therapy: Secondary | ICD-10-CM | POA: Diagnosis not present

## 2015-02-28 DIAGNOSIS — Z8781 Personal history of (healed) traumatic fracture: Secondary | ICD-10-CM | POA: Diagnosis not present

## 2015-02-28 DIAGNOSIS — E785 Hyperlipidemia, unspecified: Secondary | ICD-10-CM | POA: Diagnosis not present

## 2015-02-28 LAB — CBC
HCT: 49.6 % — ABNORMAL HIGH (ref 36.0–46.0)
Hemoglobin: 16.7 g/dL — ABNORMAL HIGH (ref 12.0–15.0)
MCH: 30.7 pg (ref 26.0–34.0)
MCHC: 33.7 g/dL (ref 30.0–36.0)
MCV: 91.2 fL (ref 78.0–100.0)
Platelets: 263 10*3/uL (ref 150–400)
RBC: 5.44 MIL/uL — ABNORMAL HIGH (ref 3.87–5.11)
RDW: 14 % (ref 11.5–15.5)
WBC: 8.4 10*3/uL (ref 4.0–10.5)

## 2015-02-28 LAB — URINALYSIS, ROUTINE W REFLEX MICROSCOPIC
Bilirubin Urine: NEGATIVE
Glucose, UA: 100 mg/dL — AB
Hgb urine dipstick: NEGATIVE
Ketones, ur: 15 mg/dL — AB
Leukocytes, UA: NEGATIVE
Nitrite: NEGATIVE
Protein, ur: NEGATIVE mg/dL
Specific Gravity, Urine: 1.008 (ref 1.005–1.030)
Urobilinogen, UA: 0.2 mg/dL (ref 0.0–1.0)
pH: 8 (ref 5.0–8.0)

## 2015-02-28 LAB — COMPREHENSIVE METABOLIC PANEL
ALT: 20 U/L (ref 14–54)
AST: 28 U/L (ref 15–41)
Albumin: 4.4 g/dL (ref 3.5–5.0)
Alkaline Phosphatase: 64 U/L (ref 38–126)
Anion gap: 8 (ref 5–15)
BUN: 13 mg/dL (ref 6–20)
CO2: 29 mmol/L (ref 22–32)
Calcium: 9.7 mg/dL (ref 8.9–10.3)
Chloride: 102 mmol/L (ref 101–111)
Creatinine, Ser: 0.68 mg/dL (ref 0.44–1.00)
GFR calc Af Amer: >60 mL/min (ref >60)
GFR calc non Af Amer: >60 mL/min (ref >60)
Glucose, Bld: 153 mg/dL — ABNORMAL HIGH (ref 65–99)
Potassium: 3.5 mmol/L (ref 3.5–5.1)
Sodium: 139 mmol/L (ref 135–145)
Total Bilirubin: 0.9 mg/dL (ref 0.3–1.2)
Total Protein: 7.9 g/dL (ref 6.5–8.1)

## 2015-02-28 LAB — LIPASE, BLOOD: Lipase: 31 U/L (ref 22–51)

## 2015-02-28 MED ORDER — SODIUM CHLORIDE 0.9 % IV BOLUS (SEPSIS): 1000.0000 mL | Freq: Once | INTRAVENOUS | Status: DC

## 2015-02-28 MED ORDER — ONDANSETRON 4 MG PO TBDP: 4.0000 mg | ORAL_TABLET | Freq: Three times a day (TID) | ORAL | Status: DC | PRN

## 2015-02-28 MED ORDER — SODIUM CHLORIDE 0.9 % IV BOLUS (SEPSIS)
500.0000 mL | Freq: Once | INTRAVENOUS | Status: AC
  Administered 2015-02-28: 500 mL via INTRAVENOUS

## 2015-02-28 MED ORDER — PROMETHAZINE HCL 25 MG/ML IJ SOLN
6.2500 mg | Freq: Once | INTRAMUSCULAR | Status: AC
  Administered 2015-02-28: 6.25 mg via INTRAVENOUS
  Filled 2015-02-28: qty 1

## 2015-02-28 NOTE — ED Notes (Signed)
Bed: KW40 Expected date:  Expected time:  Means of arrival:  Comments: EMS elderly N/V/D

## 2015-02-28 NOTE — Discharge Instructions (Signed)
1. Medications: zofran for nausea, usual home medications 2. Treatment: rest, drink plenty of fluids 3. Follow Up: please followup with your primary doctor within the next week for discussion of your diagnoses and further evaluation after today's visit; if you do not have a primary care doctor use the resource guide provided to find one; please return to the ER for high fever, chest pain, shortness of breath, severe headache, loss of consciousness, abdominal pain, persistent vomiting, blood in stools   Nausea and Vomiting Nausea means you feel sick to your stomach. Throwing up (vomiting) is a reflex where stomach contents come out of your mouth. HOME CARE   Take medicine as told by your doctor.  Do not force yourself to eat. However, you do need to drink fluids.  If you feel like eating, eat a normal diet as told by your doctor.  Eat rice, wheat, potatoes, bread, lean meats, yogurt, fruits, and vegetables.  Avoid high-fat foods.  Drink enough fluids to keep your pee (urine) clear or pale yellow.  Ask your doctor how to replace body fluid losses (rehydrate). Signs of body fluid loss (dehydration) include:  Feeling very thirsty.  Dry lips and mouth.  Feeling dizzy.  Dark pee.  Peeing less than normal.  Feeling confused.  Fast breathing or heart rate. GET HELP RIGHT AWAY IF:   You have blood in your throw up.  You have black or bloody poop (stool).  You have a bad headache or stiff neck.  You feel confused.  You have bad belly (abdominal) pain.  You have chest pain or trouble breathing.  You do not pee at least once every 8 hours.  You have cold, clammy skin.  You keep throwing up after 24 to 48 hours.  You have a fever. MAKE SURE YOU:   Understand these instructions.  Will watch your condition.  Will get help right away if you are not doing well or get worse. Document Released: 11/04/2007 Document Revised: 08/10/2011 Document Reviewed:  10/17/2010 [facility] Patient Information 2015 [place], [place]. This information is not intended to replace advice given to you by your health care provider. Make sure you discuss any questions you have with your health care provider.  Diarrhea Diarrhea is watery poop (stool). It can make you feel weak, tired, thirsty, or give you a dry mouth (signs of dehydration). Watery poop is a sign of another problem, most often an infection. It often lasts 2-3 days. It can last longer if it is a sign of something serious. Take care of yourself as told by your doctor. HOME CARE   Drink 1 cup (8 ounces) of fluid each time you have watery poop.  Do not drink the following fluids:  Those that contain simple sugars (fructose, glucose, galactose, lactose, sucrose, maltose).  Sports drinks.  Fruit juices.  Whole milk products.  Sodas.  Drinks with caffeine (coffee, tea, soda) or alcohol.  Oral rehydration solution may be used if the doctor says it is okay. You may make your own solution. Follow this recipe:   - teaspoon table salt.   teaspoon baking soda.   teaspoon salt substitute containing potassium chloride.  1 tablespoons sugar.  1 liter (34 ounces) of water.  Avoid the following foods:  High fiber foods, such as raw fruits and vegetables.  Nuts, seeds, and whole grain breads and cereals.   Those that are sweetened with sugar alcohols (xylitol, sorbitol, mannitol).  Try eating the following foods:  Starchy foods, such as rice, toast,  pasta, low-sugar cereal, oatmeal, baked potatoes, crackers, and bagels.  Bananas.  Applesauce.  Eat probiotic-rich foods, such as yogurt and milk products that are fermented.  Wash your hands well after each time you have watery poop.  Only take medicine as told by your doctor.  Take a warm bath to help lessen burning or pain from having watery poop. GET HELP RIGHT AWAY IF:   You cannot drink fluids without throwing up (vomiting).  You  keep throwing up.  You have blood in your poop, or your poop looks black and tarry.  You do not pee (urinate) in 6-8 hours, or there is only a small amount of very dark pee.  You have belly (abdominal) pain that gets worse or stays in the same spot (localizes).  You are weak, dizzy, confused, or light-headed.  You have a very bad headache.  Your watery poop gets worse or does not get better.  You have a fever or lasting symptoms for more than 2-3 days.  You have a fever and your symptoms suddenly get worse. MAKE SURE YOU:   Understand these instructions.  Will watch your condition.  Will get help right away if you are not doing well or get worse. Document Released: 11/04/2007 Document Revised: 10/02/2013 Document Reviewed: 01/24/2012 [facility] Patient Information 2015 [place], [place]. This information is not intended to replace advice given to you by your health care provider. Make sure you discuss any questions you have with your health care provider.   Emergency Department Resource Guide 1) Find a Doctor and Pay Out of Pocket Although you won't have to find out who is covered by your insurance plan, it is a good idea to ask around and get recommendations. You will then need to call the office and see if the doctor you have chosen will accept you as a new patient and what types of options they offer for patients who are self-pay. Some doctors offer discounts or will set up payment plans for their patients who do not have insurance, but you will need to ask so you aren't surprised when you get to your appointment.  2) Contact Your Local Health Department Not all health departments have doctors that can see patients for sick visits, but many do, so it is worth a call to see if yours does. If you don't know where your local health department is, you can check in your phone book. The CDC also has a tool to help you locate your state's health department, and many state websites also have  listings of all of their local health departments.  3) Find a [facility] If your illness is not likely to be very severe or complicated, you may want to try a walk in clinic. These are popping up all over the country in pharmacies, drugstores, and shopping centers. They're usually staffed by nurse practitioners or physician assistants that have been trained to treat common illnesses and complaints. They're usually fairly quick and inexpensive. However, if you have serious medical issues or chronic medical problems, these are probably not your best option.  No Primary Care Doctor: - Call Health Connect at  [phone] - they can help you locate a primary care doctor that  accepts your insurance, provides certain services, etc. - Physician Referral Service- [phone]  Chronic Pain Problems: Organization         Address  Phone   Notes  [facility]  [phone] Patients need to be referred by their primary  care doctor.   Medication Assistance: Organization         Address  Phone   Notes  [facility] Medication [facility] [address] [place], [place] [address] [phone] --Must be a resident of [facility] -- Must have NO insurance coverage whatsoever (no Medicaid/ Medicare, etc.) -- The pt. MUST have a primary care doctor that directs their care regularly and follows them in the community   MedAssist  [phone]   [employer]  [phone]    Agencies that provide inexpensive medical care: Organization         Address  Phone   Notes  [place]  [phone]   [facility]    [phone]   [facility] [place], [place] [address] [phone]   [place] [address], [place] [phone]   Planned Parenthood    [phone]   [facility]    [phone]   [place] and [place]  [address], [place] Phone:  [phone], Fax:  [phone] Hours of Operation:  9 am - 6 pm, M-F.  Also accepts Medicaid/Medicare and self-pay.  [facility] for [place], [address], [place] Phone: [phone], Fax: [phone]. Hours of Operation:  8:30 am - 5:30 pm, M-F.  Also accepts Medicaid and self-pay.  [facility] [address] [place] Phone: [phone]   [place], [place], [place] [phone], Ext. [phone] Mondays & Thursdays: 7-9 AM.  First 15 patients are seen on a first come, first serve basis.    [place] Providers:  Organization         Address  Phone   Notes  [facility] [address], [place] [phone] Also accepts self-pay patients.  [facility] [address], [place]  [phone]   [place], [address], [place] [phone]   [facility] [address], [place] [phone]   Lucianne Lei [address], [place]   [phone] Only accepts [place] Access [place] patients after they have their name applied to their card.   Self-Pay (no insurance) in [facility]:  Organization         Address  Phone   Notes  Sickle Cell Patients, [facility] Internal Medicine [place] [phone]   [facility] [place] [phone]   [facility] [place]  [place], [place],  [phone]   [facility]/Dr. Osei-Bonsu  [address] [place] or [address], [place] [phone] Phone number for both [place] and [place] locations is the same.  [facility]gent [facility] and [facility] [address] [place] [phone]   [facility] [address],  [place] or [address] [phone]   [facility] [address] [place] [phone], phone; [phone], fax Sees patients 1st and 3rd Saturday of every month.  Must not qualify for public or private insurance (i.e. Medicaid, Medicare, [place] Health Choice, Veterans' Benefits)  Household income should be no more than 200% of  the poverty level The clinic cannot treat you if you are pregnant or think you are pregnant  Sexually transmitted diseases are not treated at the clinic.    Dental Care: Organization         Address  Phone  Notes  [facility] Department of [facility] [place] [phone] Accepts children up to age 47 who are enrolled in [place] or [place]; pregnant women with a Medicaid card; and children who have applied for Medicaid or [place] Health Choice, but were declined, whose parents can pay a reduced fee at time of service.  [facility] Department of [facility]  [address], [place] [phone] Accepts children up to age 8 who are enrolled in [place] or [place]; pregnant women with a Medicaid card; and children who have applied for Medicaid or [place] Health Choice, but were declined, whose parents can pay a reduced fee at time of service.  [place] Adult Dental Access PROGRAM  [place] [phone] Patients are seen by appointment only. Walk-ins are not accepted. [place] will see patients 31 years of age and older. Monday - Tuesday (8am-5pm) Most Wednesdays (8:30-5pm) $30 per visit, cash only  [facility] Adult Dental Access PROGRAM  [address], [facility] [phone] Patients are seen by appointment only. Walk-ins are not accepted. [place] will see patients 81 years of age and older. One Wednesday Evening (Monthly: Volunteer Based).  $30 per visit, cash only  [place]  [phone] for adults; Children under age 66, call Graduate Pediatric Dentistry at [phone]. Children aged 27-14, please call [phone] to request a pediatric application.  Dental services are provided in all areas of dental care including fillings, crowns and bridges, complete and partial dentures, implants, gum treatment, root canals, and extractions. Preventive care is also provided. Treatment is provided to both adults and children. Patients are selected via a lottery and there is often a waiting list.   [facility] [address] [place]  [phone] [email or web address]   [facility] [address] [place], [place] [phone], Ext. [phone] Second and Fourth Thursday of each month, opens at 6:30 AM; Clinic ends at 9 AM.  Patients are seen on a first-come first-served basis, and a limited number are seen during each clinic.   [facility]  [address] Hillard Danker [place], [place] [phone]   Eligibility Requirements You must have lived in [place], [place], or [place] counties for at least the last three months.   You cannot be eligible for state or federal sponsored [employer], including [employer], [place], or [employer].   You generally cannot be eligible for healthcare insurance through your employer.    How to apply: Eligibility screenings are held every Tuesday and Wednesday afternoon from 1:00 pm until 4:00 pm. You do not need an appointment for the interview!  [facility] [address], [place], [place]   [place]  [place] Department  [place]  [phone]    Behavioral Health Resources in the Community: Intensive Outpatient Programs Organization         Address  Phone  Notes  [place] [address] [place], [place]    [place]  [address], [place], [place]   ADS: Alcohol & Drug Svcs [address] [place], [place]   [facility] [phone] [address]  [place], [place] or [phone]   Substance Abuse Resources Organization         Address  Phone  Notes  Alcohol and Drug Services  [phone]   [address]  [phone]   [facility] [place]   Chinita Pester  [phone]   Residential & Outpatient Substance Abuse Program  [phone]   Psychological Services Organization         Address  Phone  Notes  [facility] [place]  [place]  [phone]   [place] [address] [place] or [phone]    Mobile Crisis Teams Organization         Address  Phone  Notes  [facility], Mobile Crisis Care Unit  [phone]   [facility] Psychotherapeutic Services  [address] [place], [place]   [place] Levels [address] [place] [phone]    Self-Help/Support Groups Organization         Address  Phone             Notes  [place] - variety of support groups  [place] Call for more information  Narcotics Anonymous (NA), Caring Services [address], [employer] [place]  2 meetings at this location   Special educational needs teacher         Address  Phone  Notes  [facility]idential Treatment [place],    [place]  [phone]   [facility]  [address] [place] [address], [place], [place]   [place], [place] [phone] Admissions: 8am-3pm M-F  Incentives Substance [place] [address]    [place], [place] [phone]   [facility] [address] [place], [place], [place]   [facility] [address]  [place],  [place]   [facility]ight Programs - Intensive Outpatient [address], [place], [place]   [facility].,  [place], [place] [phone] or [phone]   [facility] Services (RTS) [address] [place], [place] Accepts Medicaid  [facility] [place] [address],  [place] [phone] Substance Abuse/Addiction Treatment   [facility] Organization         Address  Phone  Notes  [facility]  [phone]   Domenic Schwab, PhD [address] [place], [place]   [phone] or [phone]   [place], [place] [phone]   [facility] [address] [place], [place] [phone] Insurance/Medicaid/sponsorship through [facility] and Families [address] [place]                                    [place], [place] [phone] [place] [address], [place] [phone]    Dr. Adele Schilder  [phone]   [facility] Dept. 1) [address] [place] 2) [place] 3)  [place] [place] [address], Visual merchandiser (  336) [phone]  [phone]   [facility] Child Abuse Hotline 629-774-2668 or [phone] (After Hours)

## 2015-02-28 NOTE — ED Provider Notes (Signed)
CSN: [id number]     Arrival date & time 02/28/15  2013 History   First MD Initiated Contact with Patient 02/28/15 2039     Chief Complaint  Patient presents with  . Nausea  . Emesis  . Diarrhea  . Hypertension    HPI   Shelly Ross is a 81 y.o. female with a PMH of HTN, HLD, hypothyroidism who presents to the ED with nausea, vomiting, and diarrhea, which started today around 4 PM. The patient states she started to not feel well, and was more tired than usual this afternoon. She has had 4 episodes of emesis. She denies hematemesis. She also reports 2 episodes of loose stools. She denies hematochezia or melena. She denies fever, chills, headaches, lightheadedness, dizziness, chest pain, shortness of breath, abdominal pain, dysuria, urgency, frequency. She denies exacerbating or alleviating factors. She states she was recently treated for a kidney infection with cipro, and finished her antibiotic course.   Past Medical History  Diagnosis Date  . Chronic low back pain   . Dizziness     some  . Hypertension   . Neuralgia, neuritis, and radiculitis, unspecified   . Degenerative disc disease     CERVICAL SPINE,W/RADICULOPATHY  . GERD (gastroesophageal reflux disease)   . Hyperlipidemia   . Thyroid disease     HYPOTHYROIDISM  . Osteoporosis   . Rib fractures   . Stricture and stenosis of esophagus 2007  . Adenomatous colon polyp 2003  . Premature ventricular contractions 2006  . Hypothyroidism   . Headache(784.0)     not migraines, but a lot of headaches  . Hepatitis     jaundice as child  . SVT (supraventricular tachycardia)     S/P ablation 2005   Past Surgical History  Procedure Laterality Date  . Abdominal hysterectomy      WITH OOPHORECTOMY,? BILATERAL FOR ENDOMETRIOSIS  . Breast enhancement surgery    . G2 p2    . Bladder tacking    . Endovenous ablation saphenous vein w/ laser  07-13-2011    right greater saphenous vein, Dr Mamie Nick  . Endovenous ablation  saphenous vein w/ laser  08/2011    L ; Dr Kellie Simmering  . Cataract extraction Bilateral   . Esophageal dilation       X 1  . Colonoscopy    . Cardiac electrophysiology mapping and ablation    . Total knee arthroplasty Right 04/17/2013    Procedure: RIGHT TOTAL KNEE ARTHROPLASTY, CORTISONE INJECTION LEFT KNEE;  Surgeon: Gearlean Alf, MD;  Location: [facility];  Service: Orthopedics;  Laterality: Right;  . Pacemaker insertion  04/19/13    MDT Adapta L iimplanted by Dr Rayann Heman for mobitz II second degree AV block  . Permanent pacemaker insertion N/A 04/19/2013    Procedure: PERMANENT PACEMAKER INSERTION;  Surgeon: Coralyn Mark, MD;  Location: [place] [facility];  Service: Cardiovascular;  Laterality: N/A;   Family History  Problem Relation Age of Onset  . Stroke Mother 79  . Heart attack Father     in 64s  . Deep vein thrombosis Brother     AND PTE  . Diabetes Brother   . Cancer Paternal Aunt      INTESTINAL   . Alzheimer's disease Sister   . Alzheimer's disease Brother   . Tuberculosis Brother   . Aneurysm Brother     cns  . Heart Problems    . COPD Sister     smoking  . Heart attack Sister  63   Social History  Substance Use Topics  . Smoking status: Never Smoker   . Smokeless tobacco: Never Used  . Alcohol Use: Yes     Comment: wine, seldom    OB History    No data available      Review of Systems  Constitutional: Positive for fatigue. Negative for fever and chills.  Respiratory: Negative for shortness of breath.   Cardiovascular: Negative for chest pain.  Gastrointestinal: Positive for nausea, vomiting and diarrhea. Negative for abdominal pain, constipation, blood in stool and abdominal distention.  Genitourinary: Negative for dysuria, urgency, frequency, hematuria and flank pain.  Musculoskeletal: Negative for back pain, neck pain and neck stiffness.  Neurological: Negative for dizziness, syncope, weakness, light-headedness and headaches.  All other systems reviewed and are  negative.     Allergies  Percocet and Lansoprazole  Home Medications   Prior to Admission medications   Medication Sig Start Date End Date Taking? Authorizing Provider  acetaminophen (TYLENOL) 325 MG tablet Take 325-650 mg by mouth every 4 (four) hours as needed for mild pain. 04/21/13  Yes Arlee Muslim, PA-C  atorvastatin (LIPITOR) 20 MG tablet Take 10 mg by mouth daily.   Yes Historical Provider, MD  levothyroxine (SYNTHROID, LEVOTHROID) 75 MCG tablet Take 37.5-75 mcg by mouth daily before breakfast. Take 1/2 tablet (37.5 mcg) every morning except Wednesday, take 1 tablet (75 mcg) on Wednesday 03/15/14  Yes Historical Provider, MD  Polyethyl Glycol-Propyl Glycol (SYSTANE OP) Place 1 drop into both eyes 2 (two) times daily.   Yes Historical Provider, MD  rivastigmine (EXELON) 9.5 mg/24hr Place 9.5 mg onto the skin daily.  05/06/14  Yes Historical Provider, MD  traMADol (ULTRAM) 50 MG tablet Take 1-2 tablets (50-100 mg total) by mouth every 6 (six) hours as needed (50mg  for mild pain, 75mg  for moderate pain, 100mg  for severe pain). 05/28/14  Yes Lisette Abu, PA-C  ondansetron (ZOFRAN ODT) 4 MG disintegrating tablet Take 1 tablet (4 mg total) by mouth every 8 (eight) hours as needed for nausea. 02/28/15   Gu[place]upe Sabin Westfall, PA-C    BP 173/77 mmHg  Pulse 60  Temp(Src) 97.4 F (36.3 C) (Oral)  Resp 12  SpO2 98% Physical Exam  Constitutional: She is oriented to person, place, and time. She appears distressed.  Thin female, appears to be in mild distress due to nausea.  HENT:  Head: Normocephalic and atraumatic.  Right Ear: External ear normal.  Left Ear: External ear normal.  Nose: Nose normal.  Mouth/Throat: Uvula is midline, oropharynx is clear and moist and mucous membranes are normal.  Eyes: Conjunctivae, EOM and lids are normal. Pupils are equal, round, and reactive to light. Right eye exhibits no discharge. Left eye exhibits no discharge. No scleral icterus.  Neck:  Normal range of motion. Neck supple.  Cardiovascular: Normal rate, regular rhythm, normal heart sounds, intact distal pulses and normal pulses.   Pulmonary/Chest: Effort normal and breath sounds normal. No respiratory distress. She has no wheezes. She has no rales.  Abdominal: Soft. Normal appearance and bowel sounds are normal. She exhibits no distension and no mass. There is no tenderness. There is no rigidity, no rebound and no guarding.  Musculoskeletal: Normal range of motion. She exhibits no edema or tenderness.  Neurological: She is alert and oriented to person, place, and time.  Skin: Skin is warm, dry and intact. No rash noted. She is not diaphoretic. No erythema. No pallor.  Psychiatric: She has a normal mood and affect.  Her speech is normal and behavior is normal. Judgment and thought content normal.  Nursing note and vitals reviewed.   ED Course  Procedures (including critical care time)  Labs Review Labs Reviewed  COMPREHENSIVE METABOLIC PANEL - Abnormal; Notable for the following:    Glucose, Bld 153 (*)    All other components within normal limits  CBC - Abnormal; Notable for the following:    RBC 5.44 (*)    Hemoglobin 16.7 (*)    HCT 49.6 (*)    All other components within normal limits  URINALYSIS, ROUTINE W REFLEX MICROSCOPIC (NOT AT [facility]) - Abnormal; Notable for the following:    Glucose, UA 100 (*)    Ketones, ur 15 (*)    All other components within normal limits  LIPASE, BLOOD    Imaging Review No results found.   I have personally reviewed and evaluated these lab results as part of my medical decision-making.   EKG Interpretation None      MDM   Final diagnoses:  Non-intractable vomiting with nausea, vomiting of unspecified type  Diarrhea    81 year old female presents with nausea, vomiting, diarrhea, which started around 4 PM today. She reports generalized fatigue. She denies fever, chills, hematemesis, hematochezia, melena, dysuria, urgency,  frequency, chest pain, shortness of breath, headache, dizziness, lightheadedness.  Patient is afebrile. Mildly hypertensive, though blood pressure improved throughout course in the ED. Heart regular rate and rhythm. Lungs clear to auscultation bilaterally. Abdomen soft, nontender, nondistended. No lower extremity edema. Normal neuro exam with no focal deficit.  CBC with no leukocytosis or anemia. CMP unremarkable. Lipase within normal limits. UA with glucose and ketones. Patient given 500 mL bolus normal saline in the ED. Patient given zofran and phenergan for symptoms.  Patient reports significant improvement in symptoms. Patient discussed with and seen by Dr. Wilson Singer. Feel patient is stable for discharge at this time. Will treat nausea with zofran. Patient to follow up with PCP. Return precautions discussed.  BP 158/70 mmHg  Pulse 60  Temp(Src) 97.4 F (36.3 C) (Oral)  Resp 13  SpO2 96%     Marella Chimes, PA-C 03/01/15 0151  Virgel Manifold, MD 03/07/15 1445

## 2015-02-28 NOTE — ED Notes (Signed)
[facility] presents with a 81 yo female from home with N/V/D.  Began at 4:30 pm.  Recently treated for kidney infection.  10 days ago patient had same issues.  Hx of pacemaker, patient in paced rhythm at this time.  Patient given 4 mg Zofran by [facility]. Pt is hypertensive at this time 188/100.

## 2015-04-29 ENCOUNTER — Telehealth: Payer: Self-pay | Admitting: Cardiology

## 2015-04-29 ENCOUNTER — Encounter: Payer: Medicare Other | Admitting: *Deleted

## 2015-04-29 NOTE — Telephone Encounter (Signed)
Spoke with pt and reminded pt of remote transmission that is due today. Pt verbalized understanding.   

## 2015-06-18 ENCOUNTER — Telehealth: Payer: Self-pay | Admitting: Internal Medicine

## 2015-06-18 NOTE — Telephone Encounter (Signed)
Follow up      Calling to make sure nurse calls before leaving today regarding patient's bp

## 2015-06-18 NOTE — Telephone Encounter (Signed)
Spoke with pt's daughter.  She took her to a clinic per Dr Carlyle Lipa office yesterday as Dr Felipa Eth is out at a funeral.  The clinic where she was seen did not give her anything as her BP was 140/90.  She says she notices it going up early afternoon.  She has recordings.  She also says her moms memory is deteriorating  and she wanted to know if her remote came through in Nov.  I let her know I did not see but would have Pamala Hurry call her tomorrow to walk her(the daughter) through on how to send.  She is going to call back to her moms PCP tomorrow and ask for recommendations in regards to BP.  She has been having HA associated with increased BP and ws on medication for HTN in the past.  She will let me know if she has a problem

## 2015-06-18 NOTE — Telephone Encounter (Signed)
Have tried calling daughter back but the number is busy

## 2015-06-18 NOTE — Telephone Encounter (Addendum)
New message      Pt c/o BP issue: STAT if pt c/o blurred vision, one-sided weakness or slurred speech  1. What are your last 5 BP readings? 124/72, 138/72, 151/80----all three taken today 2. Are you having any other symptoms (ex. Dizziness, headache, blurred vision, passed out)? headache  3. What is your BP issue?  Bp is going up.  It starts off low in am and goes up by evening

## 2015-06-19 ENCOUNTER — Ambulatory Visit (INDEPENDENT_AMBULATORY_CARE_PROVIDER_SITE_OTHER): Payer: Medicare Other | Admitting: *Deleted

## 2015-06-19 DIAGNOSIS — I441 Atrioventricular block, second degree: Secondary | ICD-10-CM

## 2015-06-19 NOTE — Telephone Encounter (Signed)
Left message w/ caregiver for pt daughter to call back.

## 2015-06-19 NOTE — Telephone Encounter (Signed)
Spoke w/ pt daughter and helped her send a remote transmission. It was successful pt daughter aware.

## 2015-06-20 NOTE — Progress Notes (Signed)
Remote pacemaker transmission.   

## 2015-07-20 ENCOUNTER — Emergency Department (HOSPITAL_COMMUNITY): Payer: Medicare Other

## 2015-07-20 ENCOUNTER — Inpatient Hospital Stay (HOSPITAL_COMMUNITY)
Admission: EM | Admit: 2015-07-20 | Discharge: 2015-07-23 | DRG: 872 | Disposition: A | Discharge status: Home / Self Care | Payer: Medicare Other | Attending: Internal Medicine | Admitting: Internal Medicine

## 2015-07-20 ENCOUNTER — Encounter (HOSPITAL_COMMUNITY): Payer: Self-pay | Admitting: Emergency Medicine

## 2015-07-20 DIAGNOSIS — E782 Mixed hyperlipidemia: Secondary | ICD-10-CM | POA: Diagnosis present

## 2015-07-20 DIAGNOSIS — K219 Gastro-esophageal reflux disease without esophagitis: Secondary | ICD-10-CM | POA: Diagnosis present

## 2015-07-20 DIAGNOSIS — E872 Acidosis: Secondary | ICD-10-CM | POA: Diagnosis not present

## 2015-07-20 DIAGNOSIS — E860 Dehydration: Secondary | ICD-10-CM | POA: Diagnosis present

## 2015-07-20 DIAGNOSIS — I272 Other secondary pulmonary hypertension: Secondary | ICD-10-CM | POA: Diagnosis present

## 2015-07-20 DIAGNOSIS — Z885 Allergy status to narcotic agent status: Secondary | ICD-10-CM

## 2015-07-20 DIAGNOSIS — M5010 Cervical disc disorder with radiculopathy, unspecified cervical region: Secondary | ICD-10-CM | POA: Diagnosis present

## 2015-07-20 DIAGNOSIS — N390 Urinary tract infection, site not specified: Secondary | ICD-10-CM | POA: Diagnosis present

## 2015-07-20 DIAGNOSIS — Z96651 Presence of right artificial knee joint: Secondary | ICD-10-CM | POA: Diagnosis present

## 2015-07-20 DIAGNOSIS — M810 Age-related osteoporosis without current pathological fracture: Secondary | ICD-10-CM | POA: Diagnosis present

## 2015-07-20 DIAGNOSIS — E785 Hyperlipidemia, unspecified: Secondary | ICD-10-CM | POA: Diagnosis present

## 2015-07-20 DIAGNOSIS — R413 Other amnesia: Secondary | ICD-10-CM | POA: Diagnosis present

## 2015-07-20 DIAGNOSIS — Z950 Presence of cardiac pacemaker: Secondary | ICD-10-CM

## 2015-07-20 DIAGNOSIS — E039 Hypothyroidism, unspecified: Secondary | ICD-10-CM | POA: Diagnosis present

## 2015-07-20 DIAGNOSIS — R7989 Other specified abnormal findings of blood chemistry: Secondary | ICD-10-CM | POA: Diagnosis present

## 2015-07-20 DIAGNOSIS — K529 Noninfective gastroenteritis and colitis, unspecified: Secondary | ICD-10-CM | POA: Diagnosis present

## 2015-07-20 DIAGNOSIS — R740 Nonspecific elevation of levels of transaminase and lactic acid dehydrogenase [LDH]: Secondary | ICD-10-CM | POA: Diagnosis present

## 2015-07-20 DIAGNOSIS — M545 Low back pain: Secondary | ICD-10-CM | POA: Diagnosis present

## 2015-07-20 DIAGNOSIS — I441 Atrioventricular block, second degree: Secondary | ICD-10-CM | POA: Diagnosis present

## 2015-07-20 DIAGNOSIS — R112 Nausea with vomiting, unspecified: Secondary | ICD-10-CM | POA: Diagnosis present

## 2015-07-20 DIAGNOSIS — N12 Tubulo-interstitial nephritis, not specified as acute or chronic: Secondary | ICD-10-CM | POA: Diagnosis present

## 2015-07-20 DIAGNOSIS — R1110 Vomiting, unspecified: Secondary | ICD-10-CM

## 2015-07-20 DIAGNOSIS — G8929 Other chronic pain: Secondary | ICD-10-CM | POA: Diagnosis present

## 2015-07-20 DIAGNOSIS — A419 Sepsis, unspecified organism: Secondary | ICD-10-CM | POA: Diagnosis not present

## 2015-07-20 DIAGNOSIS — I10 Essential (primary) hypertension: Secondary | ICD-10-CM | POA: Diagnosis present

## 2015-07-20 DIAGNOSIS — E876 Hypokalemia: Secondary | ICD-10-CM | POA: Diagnosis present

## 2015-07-20 DIAGNOSIS — Z888 Allergy status to other drugs, medicaments and biological substances status: Secondary | ICD-10-CM

## 2015-07-20 DIAGNOSIS — Z79899 Other long term (current) drug therapy: Secondary | ICD-10-CM

## 2015-07-20 LAB — CBC WITH DIFFERENTIAL/PLATELET
BASOS ABS: 0 10*3/uL (ref 0.0–0.1)
Basophils Relative: 0 %
EOS PCT: 1 %
Eosinophils Absolute: 0.1 10*3/uL (ref 0.0–0.7)
HEMATOCRIT: 48.5 % — AB (ref 36.0–46.0)
HEMOGLOBIN: 16.4 g/dL — AB (ref 12.0–15.0)
LYMPHS ABS: 1.1 10*3/uL (ref 0.7–4.0)
LYMPHS PCT: 8 %
MCH: 31.4 pg (ref 26.0–34.0)
MCHC: 33.8 g/dL (ref 30.0–36.0)
MCV: 92.7 fL (ref 78.0–100.0)
Monocytes Absolute: 0.4 10*3/uL (ref 0.1–1.0)
Monocytes Relative: 3 %
NEUTROS ABS: 12.1 10*3/uL — AB (ref 1.7–7.7)
Neutrophils Relative %: 88 %
Platelets: 248 10*3/uL (ref 150–400)
RBC: 5.23 MIL/uL — AB (ref 3.87–5.11)
RDW: 13.5 % (ref 11.5–15.5)
WBC: 13.7 10*3/uL — AB (ref 4.0–10.5)

## 2015-07-20 LAB — I-STAT TROPONIN, ED
TROPONIN I, POC: 0 ng/mL (ref 0.00–0.08)
Troponin i, poc: 0 ng/mL (ref 0.00–0.08)
Troponin i, poc: 0 ng/mL (ref 0.00–0.08)

## 2015-07-20 LAB — URINALYSIS, ROUTINE W REFLEX MICROSCOPIC
Bilirubin Urine: NEGATIVE
GLUCOSE, UA: NEGATIVE mg/dL
Hgb urine dipstick: NEGATIVE
Ketones, ur: 15 mg/dL — AB
Nitrite: POSITIVE — AB
PROTEIN: NEGATIVE mg/dL
Specific Gravity, Urine: 1.01 (ref 1.005–1.030)
pH: 7 (ref 5.0–8.0)

## 2015-07-20 LAB — COMPREHENSIVE METABOLIC PANEL
ALK PHOS: 64 U/L (ref 38–126)
ALT: 21 U/L (ref 14–54)
AST: 23 U/L (ref 15–41)
Albumin: 3.6 g/dL (ref 3.5–5.0)
Anion gap: 12 (ref 5–15)
BILIRUBIN TOTAL: 1 mg/dL (ref 0.3–1.2)
BUN: 13 mg/dL (ref 6–20)
CALCIUM: 10 mg/dL (ref 8.9–10.3)
CHLORIDE: 102 mmol/L (ref 101–111)
CO2: 24 mmol/L (ref 22–32)
CREATININE: 0.81 mg/dL (ref 0.44–1.00)
Glucose, Bld: 158 mg/dL — ABNORMAL HIGH (ref 65–99)
Potassium: 3.7 mmol/L (ref 3.5–5.1)
Sodium: 138 mmol/L (ref 135–145)
TOTAL PROTEIN: 6.8 g/dL (ref 6.5–8.1)

## 2015-07-20 LAB — I-STAT CG4 LACTIC ACID, ED
LACTIC ACID, VENOUS: 1.98 mmol/L (ref 0.5–2.0)
Lactic Acid, Venous: 2.77 mmol/L (ref 0.5–2.0)

## 2015-07-20 LAB — URINE MICROSCOPIC-ADD ON: RBC / HPF: NONE SEEN RBC/hpf (ref 0–5)

## 2015-07-20 MED ORDER — PROCHLORPERAZINE EDISYLATE 5 MG/ML IJ SOLN
10.0000 mg | Freq: Four times a day (QID) | INTRAMUSCULAR | Status: DC | PRN
  Administered 2015-07-20 – 2015-07-21 (×2): 10 mg via INTRAVENOUS
  Filled 2015-07-20 (×3): qty 2

## 2015-07-20 MED ORDER — ACETAMINOPHEN 650 MG RE SUPP: 650.0000 mg | Freq: Four times a day (QID) | RECTAL | Status: DC | PRN

## 2015-07-20 MED ORDER — LEVOTHYROXINE SODIUM 75 MCG PO TABS: 75.0000 ug | ORAL_TABLET | ORAL | Status: DC

## 2015-07-20 MED ORDER — ENOXAPARIN SODIUM 40 MG/0.4ML ~~LOC~~ SOLN
40.0000 mg | SUBCUTANEOUS | Status: DC
  Administered 2015-07-20 – 2015-07-22 (×3): 40 mg via SUBCUTANEOUS
  Filled 2015-07-20 (×3): qty 0.4

## 2015-07-20 MED ORDER — SODIUM CHLORIDE 0.9 % IV BOLUS (SEPSIS)
1000.0000 mL | Freq: Once | INTRAVENOUS | Status: AC
  Administered 2015-07-20: 1000 mL via INTRAVENOUS

## 2015-07-20 MED ORDER — ACETAMINOPHEN 325 MG PO TABS
650.0000 mg | ORAL_TABLET | Freq: Four times a day (QID) | ORAL | Status: DC | PRN
  Administered 2015-07-22: 650 mg via ORAL
  Filled 2015-07-20: qty 2

## 2015-07-20 MED ORDER — LEVOTHYROXINE SODIUM 75 MCG PO TABS: 37.5000 ug | ORAL_TABLET | Freq: Every day | ORAL | Status: DC

## 2015-07-20 MED ORDER — ONDANSETRON HCL 4 MG/2ML IJ SOLN
4.0000 mg | Freq: Four times a day (QID) | INTRAMUSCULAR | Status: DC | PRN
  Administered 2015-07-20: 4 mg via INTRAVENOUS
  Filled 2015-07-20: qty 2

## 2015-07-20 MED ORDER — SODIUM CHLORIDE 0.9 % IV SOLN
INTRAVENOUS | Status: DC
  Administered 2015-07-20 – 2015-07-21 (×3): via INTRAVENOUS

## 2015-07-20 MED ORDER — RIVASTIGMINE 9.5 MG/24HR TD PT24: 9.5000 mg | MEDICATED_PATCH | Freq: Every day | TRANSDERMAL | Status: DC

## 2015-07-20 MED ORDER — FESOTERODINE FUMARATE ER 8 MG PO TB24
8.0000 mg | ORAL_TABLET | Freq: Every day | ORAL | Status: DC
  Administered 2015-07-21 – 2015-07-23 (×3): 8 mg via ORAL
  Filled 2015-07-20 (×4): qty 1

## 2015-07-20 MED ORDER — DEXTROSE 5 % IV SOLN
1.0000 g | INTRAVENOUS | Status: DC
  Administered 2015-07-21 – 2015-07-22 (×2): 1 g via INTRAVENOUS
  Filled 2015-07-20 (×3): qty 10

## 2015-07-20 MED ORDER — ATORVASTATIN CALCIUM 10 MG PO TABS
10.0000 mg | ORAL_TABLET | Freq: Every day | ORAL | Status: DC
  Administered 2015-07-20 – 2015-07-23 (×4): 10 mg via ORAL
  Filled 2015-07-20 (×4): qty 1

## 2015-07-20 MED ORDER — DEXTROSE 5 % IV SOLN
1.0000 g | INTRAVENOUS | Status: DC
  Filled 2015-07-20: qty 10

## 2015-07-20 MED ORDER — DEXTROSE 5 % IV SOLN
1.0000 g | Freq: Once | INTRAVENOUS | Status: AC
  Administered 2015-07-20: 1 g via INTRAVENOUS
  Filled 2015-07-20: qty 10

## 2015-07-20 MED ORDER — LEVOTHYROXINE SODIUM 75 MCG PO TABS
37.5000 ug | ORAL_TABLET | ORAL | Status: DC
  Administered 2015-07-21 – 2015-07-23 (×3): 37.5 ug via ORAL
  Filled 2015-07-20 (×3): qty 1

## 2015-07-20 MED ORDER — ONDANSETRON HCL 4 MG/2ML IJ SOLN
4.0000 mg | Freq: Once | INTRAMUSCULAR | Status: AC
  Administered 2015-07-20: 4 mg via INTRAVENOUS
  Filled 2015-07-20: qty 2

## 2015-07-20 NOTE — ED Notes (Signed)
MD at bedside. 

## 2015-07-20 NOTE — ED Provider Notes (Signed)
CSN: [id number]     Arrival date & time 07/20/15  0846 History   First MD Initiated Contact with Patient 07/20/15 [phone]     Chief Complaint  Patient presents with  . Emesis     (Consider location/radiation/quality/duration/timing/severity/associated sxs/prior Treatment) Patient is a 82 y.o. female presenting with vomiting.  Emesis Severity:  Severe Duration:  6 days Timing:  Constant Number of daily episodes:  Not sure over 5 Quality:  Stomach contents How soon after eating does vomiting occur:  6 hours Progression:  Unchanged Chronicity:  New Recent urination:  Normal Relieved by:  Nothing Worsened by:  Nothing tried Ineffective treatments:  None tried Associated symptoms: chills   Associated symptoms: no abdominal pain, no cough, no diarrhea, no fever, no headaches, no sore throat and no URI     Past Medical History  Diagnosis Date  . Chronic low back pain   . Dizziness     some  . Hypertension   . Neuralgia, neuritis, and radiculitis, unspecified   . Degenerative disc disease     CERVICAL SPINE,W/RADICULOPATHY  . GERD (gastroesophageal reflux disease)   . Hyperlipidemia   . Thyroid disease     HYPOTHYROIDISM  . Osteoporosis   . Rib fractures   . Stricture and stenosis of esophagus 2007  . Adenomatous colon polyp 2003  . Premature ventricular contractions 2006  . Hypothyroidism   . Headache(784.0)     not migraines, but a lot of headaches  . SVT (supraventricular tachycardia) ([place])     S/P ablation 2005   Past Surgical History  Procedure Laterality Date  . Abdominal hysterectomy      WITH OOPHORECTOMY,? BILATERAL FOR ENDOMETRIOSIS  . Breast enhancement surgery    . G2 p2    . Bladder tacking    . Endovenous ablation saphenous vein w/ laser  07-13-2011    right greater saphenous vein, Dr Mamie Nick  . Endovenous ablation saphenous vein w/ laser  08/2011    L ; Dr Kellie Simmering  . Cataract extraction Bilateral   . Esophageal dilation       X 1  . Colonoscopy     . Cardiac electrophysiology mapping and ablation    . Total knee arthroplasty Right 04/17/2013    Procedure: RIGHT TOTAL KNEE ARTHROPLASTY, CORTISONE INJECTION LEFT KNEE;  Surgeon: Gearlean Alf, MD;  Location: [facility];  Service: Orthopedics;  Laterality: Right;  . Pacemaker insertion  04/19/13    MDT Adapta L iimplanted by Dr Rayann Heman for mobitz II second degree AV block  . Permanent pacemaker insertion N/A 04/19/2013    Procedure: PERMANENT PACEMAKER INSERTION;  Surgeon: Coralyn Mark, MD;  Location: [place] [facility];  Service: Cardiovascular;  Laterality: N/A;  . Insert / replace / remove pacemaker     Family History  Problem Relation Age of Onset  . Stroke Mother 58  . Heart attack Father     in 46s  . Deep vein thrombosis Brother     AND PTE  . Diabetes Brother   . Cancer Paternal Aunt      INTESTINAL   . Alzheimer's disease Sister   . Alzheimer's disease Brother   . Tuberculosis Brother   . Aneurysm Brother     cns  . Heart Problems    . COPD Sister     smoking  . Heart attack Sister 38   Social History  Substance Use Topics  . Smoking status: Never Smoker   . Smokeless tobacco: Never Used  .  Alcohol Use: No     Comment: wine, seldom    OB History    No data available     Review of Systems  Constitutional: Positive for chills. Negative for fever.  HENT: Negative for sore throat.   Eyes: Negative for visual disturbance.  Respiratory: Negative for cough and shortness of breath.   Cardiovascular: Negative for chest pain.  Gastrointestinal: Positive for nausea and vomiting. Negative for abdominal pain, diarrhea and constipation.  Genitourinary: Negative for difficulty urinating.  Musculoskeletal: Negative for back pain and neck pain.  Skin: Negative for rash.  Neurological: Negative for syncope and headaches.      Allergies  Lansoprazole; Percocet; and Other  Home Medications   Prior to Admission medications   Medication Sig Start Date End Date Taking?  Authorizing Provider  acetaminophen (TYLENOL) 500 MG tablet Take 500 mg by mouth every 6 (six) hours as needed for moderate pain.   Yes Historical Provider, MD  atorvastatin (LIPITOR) 20 MG tablet Take 10 mg by mouth daily.   Yes Historical Provider, MD  CRANBERRY PO Take 1 tablet by mouth every morning.    Yes Historical Provider, MD  levothyroxine (SYNTHROID, LEVOTHROID) 75 MCG tablet Take 37.5-75 mcg by mouth daily before breakfast. Take 1/2 tablet (37.5 mcg) every morning except Wednesday, take 1 tablet (75 mcg) on Wednesday 03/15/14  Yes Historical Provider, MD  tolterodine (DETROL LA) 4 MG 24 hr capsule Take 4 mg by mouth daily. 07/09/15  Yes Historical Provider, MD  traMADol (ULTRAM) 50 MG tablet Take 1-2 tablets (50-100 mg total) by mouth every 6 (six) hours as needed (50mg  for mild pain, 75mg  for moderate pain, 100mg  for severe pain). Patient taking differently: Take 50 mg by mouth 2 (two) times daily.  05/28/14  Yes Lisette Abu, PA-C   BP 129/61 mmHg  Pulse 75  Temp(Src) 98.1 F (36.7 C) (Oral)  Resp 18  Wt 124 lb (56.246 kg)  SpO2 97% Physical Exam  Constitutional: She is oriented to person, place, and time. She appears well-developed and well-nourished. She appears ill. No distress.  HENT:  Head: Normocephalic and atraumatic.  Eyes: Conjunctivae and EOM are normal.  Neck: Normal range of motion.  Cardiovascular: Normal rate, regular rhythm, normal heart sounds and intact distal pulses.  Exam reveals no gallop and no friction rub.   No murmur heard. Pulmonary/Chest: Effort normal and breath sounds normal. No respiratory distress. She has no wheezes. She has no rales.  Abdominal: Soft. She exhibits no distension. There is tenderness (mild suprapubic). There is no guarding.  Musculoskeletal: She exhibits no edema or tenderness.  Neurological: She is alert and oriented to person, place, and time.  Skin: Skin is warm and dry. No rash noted. She is not diaphoretic. No erythema.   Nursing note and vitals reviewed.   ED Course  Procedures (including critical care time) Labs Review Labs Reviewed  CBC WITH DIFFERENTIAL/PLATELET - Abnormal; Notable for the following:    WBC 13.7 (*)    RBC 5.23 (*)    Hemoglobin 16.4 (*)    HCT 48.5 (*)    Neutro Abs 12.1 (*)    All other components within normal limits  COMPREHENSIVE METABOLIC PANEL - Abnormal; Notable for the following:    Glucose, Bld 158 (*)    All other components within normal limits  URINALYSIS, ROUTINE W REFLEX MICROSCOPIC (NOT AT [facility]) - Abnormal; Notable for the following:    APPearance CLOUDY (*)    Ketones, ur 15 (*)  Nitrite POSITIVE (*)    Leukocytes, UA TRACE (*)    All other components within normal limits  URINE MICROSCOPIC-ADD ON - Abnormal; Notable for the following:    Squamous Epithelial / LPF 0-5 (*)    Bacteria, UA MANY (*)    All other components within normal limits  I-STAT CG4 LACTIC ACID, ED - Abnormal; Notable for the following:    Lactic Acid, Venous 2.77 (*)    All other components within normal limits  URINE CULTURE  CULTURE, BLOOD (ROUTINE X 2)  CULTURE, BLOOD (ROUTINE X 2)  BASIC METABOLIC PANEL  CBC  I-STAT TROPOININ, ED  I-STAT TROPOININ, ED  I-STAT CG4 LACTIC ACID, ED  Randolm Idol, ED    Imaging Review Dg Chest 2 View  07/20/2015  CLINICAL DATA:  Chills, emesis. EXAM: CHEST  2 VIEW COMPARISON:  05/26/2014 chest radiograph. FINDINGS: Two lead left subclavian pacemaker is stable in configuration with lead tips overlying the right atrium and right ventricle. Stable cardiomediastinal silhouette with normal heart size. No pneumothorax. No pleural effusion. Mild scarring versus atelectasis at the left lung base. No pulmonary edema. No acute consolidative airspace disease. Healed deformities are seen in several lateral left ribs. IMPRESSION: Mild scarring versus atelectasis at the left lung base. Otherwise no active disease in the chest. Electronically Signed   By:  Ilona Sorrel M.D.   On: 07/20/2015 11:35   I have personally reviewed and evaluated these images and lab results as part of my medical decision-making.   EKG Interpretation   Date/Time:  Saturday July 20 2015 08:56:04 EST Ventricular Rate:  78 PR Interval:  151 QRS Duration: 149 QT Interval:  453 QTC Calculation: 516 R Axis:   -78 Text Interpretation:  Atrial-sensed ventricular-paced rhythm No further  analysis attempted due to paced rhythm No significant change since last  tracing Confirmed by [facility] MD, Maddalynn Barnard ([id number]) on 07/20/2015 12:00:45 PM      MDM   Final diagnoses:  Acute UTI  Elevated lactic acid level  Pyelonephritis  Intractable vomiting with nausea, vomiting of unspecified type   82 year old female with a history of hypertension, hyperlipidemia, hypothyroidism, SVT status post ablation in 2005, pacemaker placement,  hysterectomy presents with concern for nausea and vomiting which began last night.  Patient denies associated abdominal pain, and abdominal exam is benign with exception of mild suprapubic tenderness. Have low suspicion for obstruction given patient had bowel movement in the ED and does not have abdominal pain.  Patient denies chest pain, however given n/v with no abdominal pain, EKG and delta troponins were done which both did not show signs of acute ischemia.  Urinalysis has positive nitrites, many bacteria, with no signs of contamination, and feel patient's nausea vomiting is likely secondary to pyelonephritis and urinary source. She is given Zofran, with continued emesis, and given Compazine with improvement in emesis. Blood cx and istat lactic acid were done which showed lactic acid 2.77. Given additional 1L of NS to total 2.  Pt given rocephin for UTI. Will admit for continued hydration, treatment of pyelonephritis given elevated lactic acid, age, ill appearance.   Gareth Morgan, MD 07/20/15 Einar Crow

## 2015-07-20 NOTE — ED Notes (Addendum)
Pt arrives via [facility] from [facility], pt had onset of n/v around 0500 this am, ems reports pt was pale and diaphoretic upon their arrival. Pt denies diarrhea, but reports right shoulder pain. received 4mg  zofran pta. Pt is in a paced rhythm at 80 bpm. Pt a/o, nad. resp e/u.

## 2015-07-20 NOTE — ED Notes (Signed)
Dr. Billy Fischer aware of dose of zofran being given to patient pta by ems, advised still okay to administer second dose of zofran at this time.

## 2015-07-20 NOTE — ED Notes (Signed)
I stat Trop Results shown to Dr. Billy Fischer

## 2015-07-20 NOTE — ED Notes (Signed)
I stat Trop results shown to Dr.

## 2015-07-20 NOTE — H&P (Signed)
[facility] History and Physical                                                                                    Shelly Ross, is a 82 y.o. female  MRN: [id number]   DOB - [birth year]  Admit Date - 07/20/2015  Outpatient Primary MD for the patient is No primary care provider on file.  Referring MD: Billy Fischer / ER  PMH: Past Medical History  Diagnosis Date  . Chronic low back pain   . Dizziness     some  . Hypertension   . Neuralgia, neuritis, and radiculitis, unspecified   . Degenerative disc disease     CERVICAL SPINE,W/RADICULOPATHY  . GERD (gastroesophageal reflux disease)   . Hyperlipidemia   . Thyroid disease     HYPOTHYROIDISM  . Osteoporosis   . Rib fractures   . Stricture and stenosis of esophagus 2007  . Adenomatous colon polyp 2003  . Premature ventricular contractions 2006  . Hypothyroidism   . Headache(784.0)     not migraines, but a lot of headaches  . Hepatitis     jaundice as child  . SVT (supraventricular tachycardia) ([place])     S/P ablation 2005      PSH: Past Surgical History  Procedure Laterality Date  . Abdominal hysterectomy      WITH OOPHORECTOMY,? BILATERAL FOR ENDOMETRIOSIS  . Breast enhancement surgery    . G2 p2    . Bladder tacking    . Endovenous ablation saphenous vein w/ laser  07-13-2011    right greater saphenous vein, Dr Mamie Nick  . Endovenous ablation saphenous vein w/ laser  08/2011    L ; Dr Kellie Simmering  . Cataract extraction Bilateral   . Esophageal dilation       X 1  . Colonoscopy    . Cardiac electrophysiology mapping and ablation    . Total knee arthroplasty Right 04/17/2013    Procedure: RIGHT TOTAL KNEE ARTHROPLASTY, CORTISONE INJECTION LEFT KNEE;  Surgeon: Gearlean Alf, MD;  Location: [facility];  Service: Orthopedics;  Laterality: Right;  . Pacemaker insertion  04/19/13    MDT Adapta L iimplanted by Dr Rayann Heman for mobitz II second degree AV block  . Permanent pacemaker insertion N/A 04/19/2013   Procedure: PERMANENT PACEMAKER INSERTION;  Surgeon: Coralyn Mark, MD;  Location: [facility];  Service: Cardiovascular;  Laterality: N/A;     CC:  Chief Complaint  Patient presents with  . Emesis     HPI: This is an 82 year old female patient who is a resident of an independent living facility who awakened this morning around 5 AM with nausea and vomiting. EMS was called to the facility and reported the patient to be pale and diaphoretic in appearance. Additional history obtained from daughter and patient after arrival to ER patient had been having dysuria and foul-smelling urine as well as some sheet for pubic discomfort for about 2-3 days. She's not had any diarrhea. Of note 2 weeks ago patient had headache and was having labile blood pressures and apparent orthostatic hypotension with rebound hypertension according to the daughter. The daughter read that Lenox Ponds can cause  the symptoms so she took the patient off this medication and notified the PCP. Daughter reports apparently at the facility the patient resides there have been cases of gastroenteritis.  ER Evaluation and treatment: Afebrile, BP 177/94, pulse 78 and regular with a percent paced rhythm, respirations 18, room air saturations 98%. EKG: 100% ventricular paced rhythm rate 78 bpm, QTC 516 ms in setting of wide QRS with pattern consistent with right bundle-branch block 2 View CXR: Mild scarring versus atelectasis left lung base otherwise no acute process Laboratory data: Na 138, K 3.7, BUN 13, Cr 0.81, glucose 158, point of care troponin 0.002 collections, lactic acid 2.77, WBC 13,700, hemoglobin 16.4, neutrophils 88% with absolute neutrophils 12.1%, urinalysis cloudy in appearance with many bacteria, 15 ketones, trace leukocytes but positive for nitrite, WBC 0-5, urine culture and blood cultures obtained in the ER pending results Normal saline IV bolus 2 L Zofran 4 mg IV 1 Rocephin 1 g IV 1 Compazine 10 mg IV 1  Review of  Systems   In addition to the HPI above,  No Fever-chills, myalgias or other constitutional symptoms No Headache, changes with Vision or hearing, new weakness, tingling, numbness in any extremity, No problems swallowing food or Liquids, indigestion/reflux No Chest pain, Cough or Shortness of Breath, palpitations, orthopnea or DOE No Abdominal pain, N/V; no melena or hematochezia, no dark tarry stools, Bowel movements are regular, No dysuria, hematuria or flank pain No new skin rashes, lesions, masses or bruises, No new joints pains-aches No recent weight gain or loss No polyuria, polydypsia or polyphagia,  *A full 10 point Review of Systems was done, except as stated above, all other Review of Systems were negative.  Social History Social History  Substance Use Topics  . Smoking status: Never Smoker   . Smokeless tobacco: Never Used  . Alcohol Use: Yes     Comment: wine, seldom     Resides at: Independent living facility  Lives with: N/A  Ambulatory status: Without assistive devices   Family History Family History  Problem Relation Age of Onset  . Stroke Mother 11  . Heart attack Father     in 48s  . Deep vein thrombosis Brother     AND PTE  . Diabetes Brother   . Cancer Paternal Aunt      INTESTINAL   . Alzheimer's disease Sister   . Alzheimer's disease Brother   . Tuberculosis Brother   . Aneurysm Brother     cns  . Heart Problems    . COPD Sister     smoking  . Heart attack Sister 31     Prior to Admission medications   Medication Sig Start Date End Date Taking? Authorizing Provider  CRANBERRY PO Take 1 tablet by mouth daily at 12 noon.   Yes Historical Provider, MD  traMADol (ULTRAM) 50 MG tablet Take 1-2 tablets (50-100 mg total) by mouth every 6 (six) hours as needed (50mg  for mild pain, 75mg  for moderate pain, 100mg  for severe pain). Patient taking differently: Take 50 mg by mouth 2 (two) times daily.  05/28/14  Yes Lisette Abu, PA-C    acetaminophen (TYLENOL) 325 MG tablet Take 325-650 mg by mouth every 4 (four) hours as needed for mild pain. 04/21/13   Arlee Muslim, PA-C  atorvastatin (LIPITOR) 10 MG tablet Take 10 mg by mouth. 07/18/15   Historical Provider, MD  atorvastatin (LIPITOR) 20 MG tablet Take 10 mg by mouth daily.    Historical Provider, MD  levothyroxine (  SYNTHROID, LEVOTHROID) 75 MCG tablet Take 37.5-75 mcg by mouth daily before breakfast. Take 1/2 tablet (37.5 mcg) every morning except Wednesday, take 1 tablet (75 mcg) on Wednesday 03/15/14   Historical Provider, MD  MYRBETRIQ 25 MG TB24 tablet Take 25 mg by mouth daily. 06/02/15   Historical Provider, MD  ondansetron (ZOFRAN ODT) 4 MG disintegrating tablet Take 1 tablet (4 mg total) by mouth every 8 (eight) hours as needed for nausea. 02/28/15   Marella Chimes, PA-C  Polyethyl Glycol-Propyl Glycol (SYSTANE OP) Place 1 drop into both eyes 2 (two) times daily.    Historical Provider, MD  rivastigmine (EXELON) 9.5 mg/24hr Place 9.5 mg onto the skin daily.  05/06/14   Historical Provider, MD  tolterodine (DETROL LA) 4 MG 24 hr capsule Take 4 mg by mouth daily. 07/09/15   Historical Provider, MD    Allergies  Allergen Reactions  . Percocet [Oxycodone-Acetaminophen] Nausea And Vomiting  . Lansoprazole Other (See Comments)    Induced nausea and possibly headaches    Physical Exam  Vitals  Blood pressure 156/87, pulse 86, temperature 97.4 F (36.3 C), temperature source Oral, resp. rate 17, SpO2 96 %.   General:  In no acute distress, appears somewhat drowsy post anti-E medic medication and actually appears younger than stated age  Psych: Flat and drowsy affect, oriented times name and place and able to assist somewhat with history although having difficulty participating secondary to hearing loss  Neuro:   No focal neurological deficits, CN II through XII intact except for known bilateral hearing loss, Strength 5/5 all 4 extremities, Sensation intact all 4  extremities.  ENT:  Ears and Eyes appear Normal, Conjunctivae clear, PER. Moist oral mucosa without erythema or exudates.  Neck:  Supple, No lymphadenopathy appreciated  Respiratory:  Symmetrical chest wall movement, Good air movement bilaterally, CTAB. Room Air  Cardiac:  RRR, No Murmurs, no LE edema noted, no JVD, No carotid bruits, peripheral pulses palpable at 2+  Abdomen:  Positive bowel sounds, Soft, somewhat tender over suprapubic region, Non distended,  No masses appreciated, no obvious hepatosplenomegaly  Skin:  No Cyanosis, Normal Skin Turgor, No Skin Rash or Bruise.  Extremities: Symmetrical without obvious trauma or injury,  no effusions.  Data Review  CBC  Recent Labs Lab 07/20/15 0905  WBC 13.7*  HGB 16.4*  HCT 48.5*  PLT 248  MCV 92.7  MCH 31.4  MCHC 33.8  RDW 13.5  LYMPHSABS 1.1  MONOABS 0.4  EOSABS 0.1  BASOSABS 0.0    Chemistries   Recent Labs Lab 07/20/15 0905  NA 138  K 3.7  CL 102  CO2 24  GLUCOSE 158*  BUN 13  CREATININE 0.81  CALCIUM 10.0  AST 23  ALT 21  ALKPHOS 64  BILITOT 1.0    CrCl cannot be calculated (Unknown ideal weight.).  No results for input(s): TSH, T4TOTAL, T3FREE, THYROIDAB in the last 72 hours.  Invalid input(s): FREET3  Coagulation profile No results for input(s): INR, PROTIME in the last 168 hours.  No results for input(s): DDIMER in the last 72 hours.  Cardiac Enzymes No results for input(s): CKMB, TROPONINI, MYOGLOBIN in the last 168 hours.  Invalid input(s): CK  Invalid input(s): POCBNP  Urinalysis    Component Value Date/Time   COLORURINE YELLOW 07/20/2015 0922   APPEARANCEUR CLOUDY* 07/20/2015 0922   LABSPEC 1.010 07/20/2015 0922   PHURINE 7.0 07/20/2015 0922   GLUCOSEU NEGATIVE 07/20/2015 0922   GLUCOSEU NEGATIVE 02/02/2013 1500   HGBUR  NEGATIVE 07/20/2015 0922   HGBUR negative 07/09/2009 1101   BILIRUBINUR NEGATIVE 07/20/2015 0922   BILIRUBINUR Neg 10/06/2012 0927   KETONESUR 15*  07/20/2015 0922   PROTEINUR NEGATIVE 07/20/2015 0922   PROTEINUR Neg 10/06/2012 0927   UROBILINOGEN 0.2 02/28/2015 2038   UROBILINOGEN 0.2 10/06/2012 0927   NITRITE POSITIVE* 07/20/2015 0922   NITRITE Neg 10/06/2012 0927   LEUKOCYTESUR TRACE* 07/20/2015 0922    Imaging results:   Dg Chest 2 View  07/20/2015  CLINICAL DATA:  Chills, emesis. EXAM: CHEST  2 VIEW COMPARISON:  05/26/2014 chest radiograph. FINDINGS: Two lead left subclavian pacemaker is stable in configuration with lead tips overlying the right atrium and right ventricle. Stable cardiomediastinal silhouette with normal heart size. No pneumothorax. No pleural effusion. Mild scarring versus atelectasis at the left lung base. No pulmonary edema. No acute consolidative airspace disease. Healed deformities are seen in several lateral left ribs. IMPRESSION: Mild scarring versus atelectasis at the left lung base. Otherwise no active disease in the chest. Electronically Signed   By: Ilona Sorrel M.D.   On: 07/20/2015 11:35     EKG: (Independently reviewed)  100% ventricular paced rhythm rate 78 bpm, QTC 516 ms in setting of wide QRS with pattern consistent with right bundle-branch block   Assessment & Plan  Principal Problem:   Elevated lactic acid level [name] and vomiting -Elevated lactate likely from recurrent nausea and vomiting and gastric losses and dehydration -Nausea and vomiting could be from suspected UTI but given the fact there are other residents at patient's facility with similar symptoms it is possible patient could have acute viral gastroenteritis -Continue IV fluids and supportive care    Acute UTI ?? -Urinalysis appears to be somewhat positive for UTI   -Admit to floor/observation -Empiric Rocephin -Follow up on blood and urine cultures    Hypothyroidism -Continue Synthroid    Essential hypertension  -Currently blood pressure moderately controlled and notably not on antihypertensives prior to  admission -Continue to follow during the hospitalization    HYPERLIPIDEMIA -On statin prior to admission    Memory loss -Mild and resides at facility -Because of issues with apparent headache and orthostatic hypotension with resultant rebound hypertension patient's daughter stopped the patient's Namenda and notify the PCP -Follow up with PCP regarding potential future pharmacological treatments    DVT Prophylaxis: Lovenox  Family Communication:   Daughter at bedside  Code Status:  Full code-daughter unclear about CODE STATUS and she spoke with her brother and he too was unclear-he will go to the patient's home and look for a DO NOT RESUSCITATE sheet he thinks was completed during previous hospitalization  Condition: Stable   Discharge disposition: Once medically stable anticipate return to previous nursing facility  Time spent in minutes : 60      ELLIS,ALLISON L. ANP on 07/20/2015 at 3:08 PM  You may contact me by going to [email or web address] - password [email or web address]  I am available from 7a-7p but please confirm I am on the schedule by going to Amion as above.   After 7p please contact night coverage person covering me after hours  [facility]  I have examined the patient, reviewed the chart and modified the above note which I agree with. Her main admission symptom is vomiting. No diarrhea and no abdominal pain. Suspect Gastroenteritis. Clear liquids only. PRN antiemetics. Daughter states she often has dysuria and "always" has a UTI. Has been started on Rocephin for possible UTI. My suspicion is low. D/c Rocephin if  culture does not grow in next 24 hrs.   Dashae Wilcher,MD Pager # [email or web address] 07/20/2015, 3:45 PM

## 2015-07-20 NOTE — ED Notes (Signed)
DR. Billy Fischer made aware patient is vomiting again

## 2015-07-20 NOTE — Progress Notes (Signed)
Pt arrived to room 6N23 accomp by daughter.  Pt lives at [facility], independent apt and has assistance in the am and pm for about 2 hours each.  Pt assisted up to the bathroom and urinated, used Zaldivar for balance.  Pt has bruises on her bottom and hip area from a previous fall.

## 2015-07-20 NOTE — ED Notes (Signed)
Patient transported to X-ray 

## 2015-07-21 DIAGNOSIS — Z79899 Other long term (current) drug therapy: Secondary | ICD-10-CM | POA: Diagnosis not present

## 2015-07-21 DIAGNOSIS — E860 Dehydration: Secondary | ICD-10-CM | POA: Diagnosis present

## 2015-07-21 DIAGNOSIS — K529 Noninfective gastroenteritis and colitis, unspecified: Secondary | ICD-10-CM | POA: Diagnosis present

## 2015-07-21 DIAGNOSIS — I272 Other secondary pulmonary hypertension: Secondary | ICD-10-CM | POA: Diagnosis present

## 2015-07-21 DIAGNOSIS — Z96651 Presence of right artificial knee joint: Secondary | ICD-10-CM | POA: Diagnosis present

## 2015-07-21 DIAGNOSIS — E039 Hypothyroidism, unspecified: Secondary | ICD-10-CM | POA: Diagnosis present

## 2015-07-21 DIAGNOSIS — M5010 Cervical disc disorder with radiculopathy, unspecified cervical region: Secondary | ICD-10-CM | POA: Diagnosis present

## 2015-07-21 DIAGNOSIS — I441 Atrioventricular block, second degree: Secondary | ICD-10-CM | POA: Diagnosis present

## 2015-07-21 DIAGNOSIS — R413 Other amnesia: Secondary | ICD-10-CM

## 2015-07-21 DIAGNOSIS — A419 Sepsis, unspecified organism: Secondary | ICD-10-CM | POA: Diagnosis present

## 2015-07-21 DIAGNOSIS — R55 Syncope and collapse: Secondary | ICD-10-CM | POA: Diagnosis not present

## 2015-07-21 DIAGNOSIS — N12 Tubulo-interstitial nephritis, not specified as acute or chronic: Secondary | ICD-10-CM | POA: Diagnosis present

## 2015-07-21 DIAGNOSIS — Z950 Presence of cardiac pacemaker: Secondary | ICD-10-CM | POA: Diagnosis not present

## 2015-07-21 DIAGNOSIS — N390 Urinary tract infection, site not specified: Secondary | ICD-10-CM | POA: Diagnosis present

## 2015-07-21 DIAGNOSIS — M810 Age-related osteoporosis without current pathological fracture: Secondary | ICD-10-CM | POA: Diagnosis present

## 2015-07-21 DIAGNOSIS — E872 Acidosis: Secondary | ICD-10-CM | POA: Diagnosis present

## 2015-07-21 DIAGNOSIS — E785 Hyperlipidemia, unspecified: Secondary | ICD-10-CM | POA: Diagnosis present

## 2015-07-21 DIAGNOSIS — I10 Essential (primary) hypertension: Secondary | ICD-10-CM | POA: Diagnosis present

## 2015-07-21 DIAGNOSIS — Z885 Allergy status to narcotic agent status: Secondary | ICD-10-CM | POA: Diagnosis not present

## 2015-07-21 DIAGNOSIS — K219 Gastro-esophageal reflux disease without esophagitis: Secondary | ICD-10-CM | POA: Diagnosis present

## 2015-07-21 DIAGNOSIS — E876 Hypokalemia: Secondary | ICD-10-CM

## 2015-07-21 DIAGNOSIS — G8929 Other chronic pain: Secondary | ICD-10-CM | POA: Diagnosis present

## 2015-07-21 DIAGNOSIS — M545 Low back pain: Secondary | ICD-10-CM | POA: Diagnosis present

## 2015-07-21 DIAGNOSIS — Z888 Allergy status to other drugs, medicaments and biological substances status: Secondary | ICD-10-CM | POA: Diagnosis not present

## 2015-07-21 DIAGNOSIS — R740 Nonspecific elevation of levels of transaminase and lactic acid dehydrogenase [LDH]: Secondary | ICD-10-CM | POA: Diagnosis present

## 2015-07-21 LAB — BASIC METABOLIC PANEL
ANION GAP: 7 (ref 5–15)
BUN: 18 mg/dL (ref 6–20)
CHLORIDE: 113 mmol/L — AB (ref 101–111)
CO2: 23 mmol/L (ref 22–32)
Calcium: 8.5 mg/dL — ABNORMAL LOW (ref 8.9–10.3)
Creatinine, Ser: 0.82 mg/dL (ref 0.44–1.00)
GFR calc non Af Amer: >60 mL/min (ref >60)
Glucose, Bld: 92 mg/dL (ref 65–99)
Potassium: 3.2 mmol/L — ABNORMAL LOW (ref 3.5–5.1)
SODIUM: 143 mmol/L (ref 135–145)

## 2015-07-21 LAB — CBC
HCT: 42.6 % (ref 36.0–46.0)
HEMOGLOBIN: 13.5 g/dL (ref 12.0–15.0)
MCH: 29.5 pg (ref 26.0–34.0)
MCHC: 31.7 g/dL (ref 30.0–36.0)
MCV: 93.2 fL (ref 78.0–100.0)
PLATELETS: 227 10*3/uL (ref 150–400)
RBC: 4.57 MIL/uL (ref 3.87–5.11)
RDW: 14 % (ref 11.5–15.5)
WBC: 12.2 10*3/uL — AB (ref 4.0–10.5)

## 2015-07-21 LAB — URINE CULTURE

## 2015-07-21 MED ORDER — WHITE PETROLATUM GEL
Status: AC
  Filled 2015-07-21: qty 1

## 2015-07-21 MED ORDER — POTASSIUM CHLORIDE CRYS ER 20 MEQ PO TBCR
50.0000 meq | EXTENDED_RELEASE_TABLET | Freq: Once | ORAL | Status: AC
  Administered 2015-07-21: 50 meq via ORAL
  Filled 2015-07-21: qty 1

## 2015-07-21 NOTE — Progress Notes (Signed)
[facility] PROGRESS NOTE  Shelly Ross [id number] DOB: March 10, 1925 DOA: 07/20/2015 PCP: No primary care provider on file. HPI/Subjective: 82 year old Shelly Ross PMHx Resident [facility], Chronic Pain Syndrome, HTN, Pulmonary Hypertension, Second-degree AV block/Bradycardia S/P Pacer Placement PVC, HLD, hypothyroidism, Stricture or Stenosis of esophagus,  Awakened this morning around 5 AM with nausea and vomiting. EMS was called to the facility and reported the patient to be pale and diaphoretic in appearance. Additional history obtained from daughter and patient after arrival to ER patient had been having dysuria and foul-smelling urine as well as some sheet for pubic discomfort for about 2-3 days. She's not had any diarrhea. Of note 2 weeks ago patient had headache and was having labile blood pressures and apparent orthostatic hypotension with rebound hypertension according to the daughter. The daughter read that Lenox Ponds can cause the symptoms so she took the patient off this medication and notified the PCP. Daughter reports apparently at the facility the patient resides there have been cases of gastroenteritis.  HPI/Subjective: 2/19 MAXIMUM TEMPERATURE overnight 38.2 C A/O 2 (does not where, why). Patient does know she is in a hospital  Assessment/Plan: Elevated lactic acid level Shelly Ross and vomiting -Resolved  -Continue normal saline 75 ml/hr   Sepsis/Acute UTI ??   -Upon admission patient met criteria for sepsis WBC> 12, RR>, temp> 38C -Urinalysis appears to be somewhat positive for UTI  -Continue Empiric Rocephin -Urine culture pending   Hypothyroidism -Continue Synthroid 37.5 gSu/M/T/Th/F/Sat;  75 g is day  Essential hypertension  -Currently hypotensive for patient of this age.  -Patient not on BP medication prior to admission, would not start any at this point.  -Continue normal saline 28m/hr -Strict in and out -Daily weight -Orthostatic vitals  daily -Echocardiogram pending  Second-degree AV block S/P pacer placement -Per notes patient seen by cardiology 1/18 for interrogation by Dr. JThompson Grayer Pulmonary hypertension/Tricuspid valve Regurgitation  -See hypertension  Hypokalemia  -Potassium goal > 4  -K Dur 50 mEq   HYPERLIPIDEMIA -Continue Lipitor 10 mg daily   Memory loss -Mild and resides at facility -Because of issues with apparent headache and orthostatic hypotension with resultant rebound hypertension patient's daughter stopped the patient's Namenda and notify the PCP -Follow up with PCP regarding potential future pharmacological treatments     Code Status: Full Family Communication: Shelly Ross present Disposition Plan: Resolution sepsis   Consultants: NA  Procedures: 04/18/2013 echocardiogram; Left ventricle: mild LVH. -LVEF= 65% to 70%. -(grade 1 diastolic dysfunction). - Tricuspid valve: Moderate regurgitation. - Pulmonary arteries: PA peak pressure: 446mHg (S).  Cultures 2/18 urine pending 2/18 blood pending   Antibiotics: Ceftriaxone 2/18>>   DVT prophylaxis Lovenox    Objective: Filed Vitals:   07/20/15 1723 07/20/15 2103 07/21/15 0454 07/21/15 1449  BP: 129/61 114/72 119/66 109/50  Pulse: 75 72 76 75  Temp: 98.1 F (36.7 C)  98.2 F (36.8 C) 98.1 F (36.7 C)  TempSrc: Oral Oral Oral Oral  Resp: 18 18 19 19   Weight: 56.246 kg (124 lb)  56.6 kg (124 lb 12.5 oz)   SpO2: 97% 98% 97% 100%    Intake/Output Summary (Last 24 hours) at 07/21/15 2118 Last data filed at 07/21/15 1423  Gross per 24 hour  Intake 2138.75 ml  Output    200 ml  Net 1938.75 ml   Filed Weights   07/20/15 1723 07/21/15 0454  Weight: 56.246 kg (124 lb) 56.6 kg (124 lb 12.5 oz)     Exam: General: A/O 2 (does not  where, why). Patient does know she is in a hospital, No acute respiratory distress Eyes: Negative headache, eye pain, double vision,negative scleral hemorrhage ENT: Negative Runny nose,  negative ear pain, negative tinnitus, negative gingival bleeding, Neck:  Negative scars, masses, torticollis, lymphadenopathy, JVD Lungs: Clear to auscultation bilaterally without wheezes or crackles Cardiovascular: Distant heart sounds, Regular rate and rhythm without murmur gallop or rub normal S1 and S2 Abdomen:negative abdominal pain, negative dysphagia, nondistended, positive soft, bowel sounds, no rebound, no ascites, no appreciable mass Extremities: No significant cyanosis, clubbing, or edema bilateral lower extremities Psychiatric:  Negative depression, negative anxiety, negative fatigue, negative mania  Neurologic:  Cranial nerves II through XII intact, tongue/uvula midline, all extremities muscle strength 5/5, sensation intact throughout, negative dysarthria, negative expressive aphasia, negative receptive aphasia.     Data Reviewed: Basic Metabolic Panel:  Recent Labs Lab 07/20/15 0905 07/21/15 0515  NA 138 143  K 3.7 3.2*  CL 102 113*  CO2 24 23  GLUCOSE 158* 92  BUN 13 18  CREATININE 0.81 0.82  CALCIUM 10.0 8.5*   Liver Function Tests:  Recent Labs Lab 07/20/15 0905  AST 23  ALT 21  ALKPHOS 64  BILITOT 1.0  PROT 6.8  ALBUMIN 3.6   No results for input(s): LIPASE, AMYLASE in the last 168 hours. No results for input(s): AMMONIA in the last 168 hours. CBC:  Recent Labs Lab 07/20/15 0905 07/21/15 0515  WBC 13.7* 12.2*  NEUTROABS 12.1*  --   HGB 16.4* 13.5  HCT 48.5* 42.6  MCV 92.7 93.2  PLT 248 227   Cardiac Enzymes: No results for input(s): CKTOTAL, CKMB, CKMBINDEX, TROPONINI in the last 168 hours. BNP (last 3 results) No results for input(s): BNP in the last 8760 hours.  ProBNP (last 3 results) No results for input(s): PROBNP in the last 8760 hours.  CBG: No results for input(s): GLUCAP in the last 168 hours.  Recent Results (from the past 240 hour(s))  Urine culture     Status: None   Collection Time: 07/20/15  9:22 AM  Result Value Ref  Range Status   Specimen Description URINE, CLEAN CATCH  Final   Special Requests NONE  Final   Culture MULTIPLE SPECIES PRESENT, SUGGEST RECOLLECTION  Final   Report Status 07/21/2015 FINAL  Final  Blood culture (routine x 2)     Status: None (Preliminary result)   Collection Time: 07/20/15 12:20 PM  Result Value Ref Range Status   Specimen Description BLOOD RIGHT HAND  Final   Special Requests PEDIATRICS 3CC  Final   Culture NO GROWTH 1 DAY  Final   Report Status PENDING  Incomplete  Blood culture (routine x 2)     Status: None (Preliminary result)   Collection Time: 07/20/15 12:24 PM  Result Value Ref Range Status   Specimen Description BLOOD LEFT HAND  Final   Special Requests BOTTLES DRAWN AEROBIC ONLY 5CC  Final   Culture NO GROWTH 1 DAY  Final   Report Status PENDING  Incomplete     Studies: Dg Chest 2 View  07/20/2015  CLINICAL DATA:  Chills, emesis. EXAM: CHEST  2 VIEW COMPARISON:  05/26/2014 chest radiograph. FINDINGS: Two lead left subclavian pacemaker is stable in configuration with lead tips overlying the right atrium and right ventricle. Stable cardiomediastinal silhouette with normal heart size. No pneumothorax. No pleural effusion. Mild scarring versus atelectasis at the left lung base. No pulmonary edema. No acute consolidative airspace disease. Healed deformities are seen in several lateral  left ribs. IMPRESSION: Mild scarring versus atelectasis at the left lung base. Otherwise no active disease in the chest. Electronically Signed   By: Ilona Sorrel M.D.   On: 07/20/2015 11:35    Scheduled Meds: . atorvastatin  10 mg Oral Daily  . cefTRIAXone (ROCEPHIN)  IV  1 g Intravenous Q24H  . enoxaparin (LOVENOX) injection  40 mg Subcutaneous Q24H  . fesoterodine  8 mg Oral Daily  . levothyroxine  37.5 mcg Oral Once per day on Sun Mon Tue Thu Fri Sat  . [START ON 07/24/2015] levothyroxine  75 mcg Oral Once per day on Wed   Continuous Infusions: . sodium chloride 75 mL/hr at  07/21/15 1805    Principal Problem:   Acute UTI Active Problems:   Hypothyroidism   HYPERLIPIDEMIA   Essential hypertension   Memory loss   Elevated lactic acid level   Pyelonephritis   Nausea and vomiting   UTI (urinary tract infection)   Sepsis secondary to UTI (HCC)   Second degree AV block   HLD (hyperlipidemia)    Time spent: 35 minutes    [facility], [place] [facility] Pager [phone]. If 7PM-7AM, please contact night-coverage at [email or web address], password [email or web address] 07/21/2015, 9:18 PM  LOS: 1 day    Care during the described time interval was provided by me .  I have reviewed this patient's available data, including medical history, events of note, physical examination, and all test results as part of my evaluation. I have personally reviewed and interpreted all radiology studies.   Dia Crawford, MD [phone] Pager

## 2015-07-21 NOTE — Progress Notes (Signed)
S/W pts dtr, Peter Congo, and she states that the pt has had some diarrhea at home prior to coming into the hospital.  She is wondering if it is her gall bladder.

## 2015-07-21 NOTE — Progress Notes (Signed)
Called person who does 2D Echos and he states that it will be done tomorrow.

## 2015-07-21 NOTE — Evaluation (Signed)
Physical Therapy Evaluation Patient Details Name: Shelly Ross MRN: [id number] DOB: 13-Nov-1924 Today's Date: 07/21/2015   History of Present Illness  This is an 82 year old female patient who is a resident of an independent living facility who awakened this morning around 5 AM with nausea and vomiting. EMS was called to the facility and reported the patient to be pale and diaphoretic in appearance. Additional history obtained from daughter and patient after arrival to ER patient had been having dysuria and foul-smelling urine as well as some sheet for pubic discomfort for about 2-3 days. She's not had any diarrhea. Of note 2 weeks ago patient had headache and was having labile blood pressures and apparent orthostatic hypotension with rebound hypertension according to the daughter  Clinical Impression   Pt admitted with above diagnosis. Pt currently with functional limitations due to the deficits listed below (see PT Problem List). Pt reports feeling much better, and I anticipate good progress back to her baseline as her medical course continues;  Pt will benefit from skilled PT to increase their independence and safety with mobility to allow discharge to the venue listed below.       Follow Up Recommendations No PT follow up    Equipment Recommendations  None recommended by PT    Recommendations for Other Services       Precautions / Restrictions Precautions Precautions: None      Mobility  Bed Mobility Overal bed mobility: Needs Assistance Bed Mobility: Supine to Sit     Supine to sit: Supervision     General bed mobility comments: used rails  Transfers Overall transfer level: Needs assistance Equipment used: Rolling Kretsch (2 wheeled) Transfers: Sit to/from Stand Sit to Stand: Supervision         General transfer comment: Supervision for safety  Ambulation/Gait Ambulation/Gait assistance: Supervision Ambulation Distance (Feet): 120 Feet Assistive device: Rolling  [place] (2 wheeled) Gait Pattern/deviations: Step-through pattern     General Gait Details: Cues to self-monitor for activity tolernace  Stairs            Wheelchair Mobility    Modified Rankin (Stroke Patients Only)       Balance Overall balance assessment: Needs assistance           Standing balance-Leahy Scale: Fair                               Pertinent Vitals/Pain Pain Assessment: Faces Pain Score: 0-No pain Faces Pain Scale: Hurts little more Pain Location: bil feet with feeling of "hotness"; she does not describe it as pain, but the feeling of discomfort seems to increase with more amb Pain Descriptors / Indicators: Grimacing (Heat) Pain Intervention(s): Monitored during session    Home Living Family/patient expects to be discharged to:: Private residence Living Arrangements: Alone Available Help at Discharge: Family;Available PRN/intermittently Type of Home: Independent living facility [facility]) Home Access: Level entry (need to confirm this)     Home Layout: One level Home Equipment: [place] - 2 wheels;Cane - single point      Prior Function Level of Independence: Independent with assistive device(s)         Comments: uses a cane mostly fro walking outside; RW prn     Hand Dominance        Extremity/Trunk Assessment   Upper Extremity Assessment: Overall WFL for tasks assessed           Lower Extremity Assessment: Generalized weakness  Communication   Communication: HOH  Cognition Arousal/Alertness: Awake/alert Behavior During Therapy: WFL for tasks assessed/performed Overall Cognitive Status: Within Functional Limits for tasks assessed                      General Comments      Exercises        Assessment/Plan    PT Assessment Patient needs continued PT services  PT Diagnosis Generalized weakness   PT Problem List Decreased strength;Decreased activity tolerance;Decreased  balance;Decreased mobility;Decreased knowledge of use of DME  PT Treatment Interventions DME instruction;Gait training;Stair training;Functional mobility training;Therapeutic activities;Therapeutic exercise;Balance training;Patient/family education   PT Goals (Current goals can be found in the Care Plan section) Acute Rehab PT Goals Patient Stated Goal: would like to get home PT Goal Formulation: With patient Time For Goal Achievement: 07/28/15 Potential to Achieve Goals: Good    Frequency Min 3X/week   Barriers to discharge        Co-evaluation               End of Session   Activity Tolerance: Patient tolerated treatment well Patient left: in chair;with call bell/phone within reach;with family/visitor present Nurse Communication: Mobility status    Functional Assessment Tool Used: Clinical Judgement Functional Limitation: Mobility: Walking and moving around Mobility: Walking and Moving Around Current Status [id number]): At least 1 percent but less than 20 percent impaired, limited or restricted Mobility: Walking and Moving Around Goal Status [phone]): 0 percent impaired, limited or restricted    Time: 1218-1242 PT Time Calculation (min) (ACUTE ONLY): 24 min   Charges:   PT Evaluation $PT Eval Low Complexity: 1 Procedure PT Treatments $Gait Training: 8-22 mins   PT G Codes:   PT G-Codes **NOT FOR INPATIENT CLASS** Functional Assessment Tool Used: Clinical Judgement Functional Limitation: Mobility: Walking and moving around Mobility: Walking and Moving Around Current Status [id number]): At least 1 percent but less than 20 percent impaired, limited or restricted Mobility: Walking and Moving Around Goal Status [phone]): 0 percent impaired, limited or restricted    Roney Marion Hamff 07/21/2015, 2:04 PM  Roney Marion, [place] Pager [phone] Office [phone]

## 2015-07-22 ENCOUNTER — Ambulatory Visit (HOSPITAL_COMMUNITY): Payer: Medicare Other

## 2015-07-22 DIAGNOSIS — R55 Syncope and collapse: Secondary | ICD-10-CM

## 2015-07-22 DIAGNOSIS — N390 Urinary tract infection, site not specified: Secondary | ICD-10-CM

## 2015-07-22 DIAGNOSIS — R1110 Vomiting, unspecified: Secondary | ICD-10-CM

## 2015-07-22 DIAGNOSIS — E872 Acidosis: Secondary | ICD-10-CM

## 2015-07-22 DIAGNOSIS — E785 Hyperlipidemia, unspecified: Secondary | ICD-10-CM

## 2015-07-22 DIAGNOSIS — I10 Essential (primary) hypertension: Secondary | ICD-10-CM

## 2015-07-22 LAB — CBC WITH DIFFERENTIAL/PLATELET
BASOS ABS: 0 10*3/uL (ref 0.0–0.1)
BASOS PCT: 0 %
EOS ABS: 0.2 10*3/uL (ref 0.0–0.7)
Eosinophils Relative: 3 %
HCT: 38 % (ref 36.0–46.0)
HEMOGLOBIN: 12.2 g/dL (ref 12.0–15.0)
Lymphocytes Relative: 21 %
Lymphs Abs: 1.5 10*3/uL (ref 0.7–4.0)
MCH: 30 pg (ref 26.0–34.0)
MCHC: 32.1 g/dL (ref 30.0–36.0)
MCV: 93.4 fL (ref 78.0–100.0)
MONOS PCT: 10 %
Monocytes Absolute: 0.7 10*3/uL (ref 0.1–1.0)
NEUTROS PCT: 66 %
Neutro Abs: 4.8 10*3/uL (ref 1.7–7.7)
Platelets: 189 10*3/uL (ref 150–400)
RBC: 4.07 MIL/uL (ref 3.87–5.11)
RDW: 13.8 % (ref 11.5–15.5)
WBC: 7.3 10*3/uL (ref 4.0–10.5)

## 2015-07-22 LAB — MAGNESIUM: MAGNESIUM: 2 mg/dL (ref 1.7–2.4)

## 2015-07-22 LAB — BASIC METABOLIC PANEL
ANION GAP: 6 (ref 5–15)
BUN: 7 mg/dL (ref 6–20)
CALCIUM: 8.6 mg/dL — AB (ref 8.9–10.3)
CO2: 19 mmol/L — ABNORMAL LOW (ref 22–32)
Chloride: 117 mmol/L — ABNORMAL HIGH (ref 101–111)
Creatinine, Ser: 0.62 mg/dL (ref 0.44–1.00)
GLUCOSE: 97 mg/dL (ref 65–99)
Potassium: 3.4 mmol/L — ABNORMAL LOW (ref 3.5–5.1)
SODIUM: 142 mmol/L (ref 135–145)

## 2015-07-22 MED ORDER — POTASSIUM CHLORIDE CRYS ER 20 MEQ PO TBCR
40.0000 meq | EXTENDED_RELEASE_TABLET | Freq: Once | ORAL | Status: AC
  Administered 2015-07-22: 40 meq via ORAL
  Filled 2015-07-22: qty 2

## 2015-07-22 NOTE — Progress Notes (Signed)
[facility] PROGRESS NOTE  Iya Hamed ELF:[id number] DOB: 1924-12-01 DOA: 07/20/2015 PCP: No primary care provider on file.  Brief Summary  82 year old WF PMHx Resident [place], Chronic Pain Syndrome, HTN, Pulmonary Hypertension, Second-degree AV block/Bradycardia S/P Pacer Placement PVC, HLD, hypothyroidism, Stricture or Stenosis of esophagus,  Awakened this morning around 5 AM with nausea and vomiting. EMS was called to the facility and reported the patient to be pale and diaphoretic in appearance. Additional history obtained from daughter and patient after arrival to ER patient had been having dysuria and foul-smelling urine as well as some sheet for pubic discomfort for about 2-3 days. She's not had any diarrhea. Of note 2 weeks ago patient had headache and was having labile blood pressures and apparent orthostatic hypotension with rebound hypertension according to the daughter. The daughter read that Len[place] Ponds can cause the symptoms so she took the patient off this medication and notified the PCP. Daughter reports apparently at the facility the patient resides there have been cases of gastroenteritis.  Assessment/Plan  Elevated lactic acid level [name] and vomiting -Resolved  -d/c normal saline 75 ml/hr   Sepsis due to probable UTI -Upon admission patient met criteria for sepsis WBC> 12, RR>, temp> 38C -Urinalysis appears to be somewhat positive for UTI  -Continue Empiric Rocephin -Urine culture grew mixed flora, not redrawn since clinically improved with antibiotics  Hypothyroidism -Continue Synthroid 37.5 gSu/M/T/Th/F/Sat; 75 g is day  Essential hypertension  - d/c IVF -Echocardiogram pending  Second-degree AV block S/P pacer placement -Per notes patient seen by cardiology 1/18 for interrogation by Dr. Thompson Grayer  Pulmonary hypertension/Tricuspid valve Regurgitation  -See hypertension  Hypokalemia  -Potassium goal > 4  -K Dur 50 mEq    HYPERLIPIDEMIA -Continue Lipitor 10 mg daily   Memory loss -Mild and resides at facility -Because of issues with apparent headache and orthostatic hypotension with resultant rebound hypertension patient's daughter stopped the patient's Namenda and notify the PCP -Follow up with PCP regarding potential future pharmacological treatments  Mild nongap metabolic acidosis likely related to IVF -  D/c IVF and advance diet  Diet:  Advance to regular Access:  PIV IVF:  off Proph:  lovenox  Code Status: full Family Communication: patient and her daughter Disposition Plan:  Likely discharge tomorrow to facility   Consultants:  None  Procedures:  ECHO  Antibiotics:  Ceftriaxone 2/18  HPI/Subjective:  Patient states that she feels fine and wants to go home.  She has talked to her daughter who is coming to pick her up and take her home.  Denies chest pain, nausea, vomiting, diarrhea, shortness of breath, dysuria.  Not sure in what way she feels better, but she feels better since coming to hospital.    Objective: Filed Vitals:   07/21/15 1449 07/21/15 2145 07/22/15 0500 07/22/15 1400  BP: 109/50 137/71 145/69 155/75  Pulse: 75 75 77 67  Temp: 98.1 F (36.7 C) 98.2 F (36.8 C) 98.1 F (36.7 C) 98.4 F (36.9 C)  TempSrc: Oral Oral Oral Oral  Resp: 19 18 16 16   Weight:   58.741 kg (129 lb 8 oz)   SpO2: 100% 97% 98% 100%    Intake/Output Summary (Last 24 hours) at 07/22/15 1920 Last data filed at 07/22/15 1300  Gross per 24 hour  Intake    600 ml  Output    200 ml  Net    400 ml   Filed Weights   07/20/15 1723 07/21/15 0454 07/22/15 0500  Weight: 56.246  kg (124 lb) 56.6 kg (124 lb 12.5 oz) 58.741 kg (129 lb 8 oz)   Body mass index is 24.08 kg/(m^2).  Exam:   General:  Adult female, No acute distress  HEENT:  NCAT, MMM  Cardiovascular:  RRR, nl S1, S2 no mrg, 2+ pulses, warm extremities  Respiratory:  CTAB, no increased WOB  Abdomen:   NABS, soft,  NT/ND  MSK:   Normal tone and bulk, no LEE  Neuro:  Grossly intact  Data Reviewed: Basic Metabolic Panel:  Recent Labs Lab 07/20/15 0905 07/21/15 0515 07/22/15 0643  NA 138 143 142  K 3.7 3.2* 3.4*  CL 102 113* 117*  CO2 24 23 19*  GLUCOSE 158* 92 97  BUN 13 18 7   CREATININE 0.81 0.82 0.62  CALCIUM 10.0 8.5* 8.6*  MG  --   --  2.0   Liver Function Tests:  Recent Labs Lab 07/20/15 0905  AST 23  ALT 21  ALKPHOS 64  BILITOT 1.0  PROT 6.8  ALBUMIN 3.6   No results for input(s): LIPASE, AMYLASE in the last 168 hours. No results for input(s): AMMONIA in the last 168 hours. CBC:  Recent Labs Lab 07/20/15 0905 07/21/15 0515 07/22/15 0643  WBC 13.7* 12.2* 7.3  NEUTROABS 12.1*  --  4.8  HGB 16.4* 13.5 12.2  HCT 48.5* 42.6 38.0  MCV 92.7 93.2 93.4  PLT 248 227 189    Recent Results (from the past 240 hour(s))  Urine culture     Status: None   Collection Time: 07/20/15  9:22 AM  Result Value Ref Range Status   Specimen Description URINE, CLEAN CATCH  Final   Special Requests NONE  Final   Culture MULTIPLE SPECIES PRESENT, SUGGEST RECOLLECTION  Final   Report Status 07/21/2015 FINAL  Final  Blood culture (routine x 2)     Status: None (Preliminary result)   Collection Time: 07/20/15 12:20 PM  Result Value Ref Range Status   Specimen Description BLOOD RIGHT HAND  Final   Special Requests PEDIATRICS 3CC  Final   Culture NO GROWTH 2 DAYS  Final   Report Status PENDING  Incomplete  Blood culture (routine x 2)     Status: None (Preliminary result)   Collection Time: 07/20/15 12:24 PM  Result Value Ref Range Status   Specimen Description BLOOD LEFT HAND  Final   Special Requests BOTTLES DRAWN AEROBIC ONLY 5CC  Final   Culture NO GROWTH 2 DAYS  Final   Report Status PENDING  Incomplete     Studies: No results found.  Scheduled Meds: . atorvastatin  10 mg Oral Daily  . cefTRIAXone (ROCEPHIN)  IV  1 g Intravenous Q24H  . enoxaparin (LOVENOX) injection  40  mg Subcutaneous Q24H  . fesoterodine  8 mg Oral Daily  . levothyroxine  37.5 mcg Oral Once per day on Sun Mon Tue Thu Fri Sat  . [START ON 07/24/2015] levothyroxine  75 mcg Oral Once per day on Wed   Continuous Infusions:   Principal Problem:   Acute UTI Active Problems:   Hypothyroidism   HYPERLIPIDEMIA   Essential hypertension   Memory loss   Elevated lactic acid level   Pyelonephritis   Nausea and vomiting   UTI (urinary tract infection)   Sepsis secondary to UTI (HCC)   Second degree AV block   HLD (hyperlipidemia)    Time spent: 30 min    Valina Maes, [facility] [facility] Pager [phone]. If 7PM-7AM, please contact night-coverage at  [email or web address], password [facility] 07/22/2015, 7:20 PM  LOS: 2 days

## 2015-07-22 NOTE — Progress Notes (Signed)
Echocardiogram 2D Echocardiogram has been performed.  Shelly Ross 07/22/2015, 4:08 PM

## 2015-07-23 DIAGNOSIS — E039 Hypothyroidism, unspecified: Secondary | ICD-10-CM

## 2015-07-23 DIAGNOSIS — A419 Sepsis, unspecified organism: Principal | ICD-10-CM

## 2015-07-23 LAB — BASIC METABOLIC PANEL
Anion gap: 7 (ref 5–15)
BUN: <5 mg/dL — ABNORMAL LOW (ref 6–20)
CALCIUM: 9.3 mg/dL (ref 8.9–10.3)
CO2: 23 mmol/L (ref 22–32)
CREATININE: 0.64 mg/dL (ref 0.44–1.00)
Chloride: 113 mmol/L — ABNORMAL HIGH (ref 101–111)
GFR calc Af Amer: >60 mL/min (ref >60)
GFR calc non Af Amer: >60 mL/min (ref >60)
GLUCOSE: 94 mg/dL (ref 65–99)
Potassium: 3.4 mmol/L — ABNORMAL LOW (ref 3.5–5.1)
Sodium: 143 mmol/L (ref 135–145)

## 2015-07-23 LAB — CBC WITH DIFFERENTIAL/PLATELET
Basophils Absolute: 0 10*3/uL (ref 0.0–0.1)
Basophils Relative: 0 %
EOS ABS: 0.2 10*3/uL (ref 0.0–0.7)
EOS PCT: 4 %
HCT: 43.2 % (ref 36.0–46.0)
Hemoglobin: 13.7 g/dL (ref 12.0–15.0)
LYMPHS ABS: 2 10*3/uL (ref 0.7–4.0)
Lymphocytes Relative: 30 %
MCH: 29.1 pg (ref 26.0–34.0)
MCHC: 31.7 g/dL (ref 30.0–36.0)
MCV: 91.7 fL (ref 78.0–100.0)
MONO ABS: 0.5 10*3/uL (ref 0.1–1.0)
MONOS PCT: 8 %
Neutro Abs: 3.8 10*3/uL (ref 1.7–7.7)
Neutrophils Relative %: 58 %
PLATELETS: 274 10*3/uL (ref 150–400)
RBC: 4.71 MIL/uL (ref 3.87–5.11)
RDW: 13.5 % (ref 11.5–15.5)
WBC: 6.6 10*3/uL (ref 4.0–10.5)

## 2015-07-23 LAB — MAGNESIUM: Magnesium: 1.9 mg/dL (ref 1.7–2.4)

## 2015-07-23 MED ORDER — SACCHAROMYCES BOULARDII 250 MG PO CAPS: 250.0000 mg | ORAL_CAPSULE | Freq: Two times a day (BID) | ORAL | Status: DC

## 2015-07-23 MED ORDER — CEPHALEXIN 500 MG PO CAPS: 500.0000 mg | ORAL_CAPSULE | Freq: Two times a day (BID) | ORAL | Status: DC

## 2015-07-23 NOTE — Progress Notes (Signed)
Report called to [facility], to RN. Understanding verbalized.  Husband at the bedside. Patient aware of pending transfer by [facility] back to [facility].

## 2015-07-23 NOTE — Progress Notes (Addendum)
Occupational Therapy Evaluation/Discharge Patient Details Name: Shelly Ross MRN: [id number] DOB: 10-23-1924 Today's Date: 07/23/2015    History of Present Illness This is an 82 year old female patient who is a resident of an independent living facility who awakened this morning around 5 AM with nausea and vomiting. EMS was called to the facility and reported the patient to be pale and diaphoretic in appearance. Additional history obtained from daughter and patient after arrival to ER patient had been having dysuria and foul-smelling urine as well as some sheet for pubic discomfort for about 2-3 days. She's not had any diarrhea. Of note 2 weeks ago patient had headache and was having labile blood pressures and apparent orthostatic hypotension with rebound hypertension according to the daughter   Clinical Impression   Patient evaluated by Occupational Therapy with no acute OT needs identified. Pt performed all ADLs and mobility at supervision level. Pt's presents with generalized weakness, increased confusion, and chronic L knee and R shoulder pain. Pt's daughter expressed concern about pt being discharged home alone today with her increased confused and hx of frequent falls with injury. Spoke at length with daughter about UTI effects on cognition, fall prevention in the home, and encouraged pt to use RW at all times. Also advised pt's daughter to talk with pt and other family members to determine if pt requires an increased level of care and assistance on a daily basis. All education completed and pt/family have no further questions. Pt with no further acute OT needs. OT signing off.    Follow Up Recommendations  No OT follow up;Supervision - Intermittent    Equipment Recommendations  3 in 1 bedside comode    Recommendations for Other Services       Precautions / Restrictions Precautions Precautions: Fall Restrictions Weight Bearing Restrictions: No      Mobility Bed Mobility                General bed mobility comments: Pt up in chair on OT arrival  Transfers Overall transfer level: Needs assistance Equipment used: Rolling Leider (2 wheeled) Transfers: Sit to/from Stand Sit to Stand: Supervision         General transfer comment: Supervision for safety    Balance Overall balance assessment: Needs assistance Sitting-balance support: No upper extremity supported;Feet supported Sitting balance-Leahy Scale: Good     Standing balance support: No upper extremity supported;During functional activity Standing balance-Leahy Scale: Good Standing balance comment: able to ambulate short distance without RW and complete pericare without UE support                            ADL Overall ADL's : Needs assistance/impaired     Grooming: Wash/dry hands;Supervision/safety;Standing           Upper Body Dressing : Supervision/safety;Sitting   Lower Body Dressing: Supervision/safety;Sit to/from stand   Toilet Transfer: Supervision/safety;Ambulation;BSC;RW   Toileting- Clothing Manipulation and Hygiene: Supervision/safety;Sit to/from stand   Tub/ Shower Transfer: Walk-in shower;Supervision/safety;Ambulation;Rolling Wyne   Functional mobility during ADLs: Supervision/safety;Rolling Schissler General ADL Comments: Completed all ADLs safely with AD - encouraged pt to use RW at all times at home, but daughter reported that pt will not and family has been encouraging this for years.      Vision Vision Assessment?: No apparent visual deficits   Perception     Praxis      Pertinent Vitals/Pain Pain Assessment: Faces Faces Pain Scale: Hurts little more Pain Location: L knee and  R shoulder with movement Pain Descriptors / Indicators: Aching;Grimacing Pain Intervention(s): Limited activity within patient's tolerance;Monitored during session;Repositioned     Hand Dominance Right   Extremity/Trunk Assessment Upper Extremity Assessment Upper  Extremity Assessment: RUE deficits/detail;Generalized weakness RUE Deficits / Details: STRENGTH: shoulder 2/5, all others 3/5; AROM: shoulder flexion 90 degrees, shoulder abduction 70 degrees, others wfl - pt with hx of rotator cuff injury RUE Coordination: decreased gross motor   Lower Extremity Assessment Lower Extremity Assessment: Generalized weakness   Cervical / Trunk Assessment Cervical / Trunk Assessment: Kyphotic   Communication Communication Communication: HOH   Cognition Arousal/Alertness: Awake/alert Behavior During Therapy: WFL for tasks assessed/performed Overall Cognitive Status: Impaired/Different from baseline (per daughter's report) Area of Impairment: Memory;Safety/judgement;Awareness     Memory: Decreased short-term memory   Safety/Judgement: Decreased awareness of safety;Decreased awareness of deficits Awareness: Emergent   General Comments: Pt daughter's reports that she has cognitive deficits at baseline including confusion and forgetfulness. Pt's daughter reports she is even more confused currently - explained to daughter that it is likely due to pt's diagnosed urinary infection and may clear up.   General Comments   Lengthy discussion with pt's daughter about pt's increased confusion and her concerns about pt returning home and being alone during the day. Encouraged daughter to continue this conversation with pt and other family members if they feel pt needs increased level of care. Educated pt on effects of UTI on cognition and advised her to continue checking in on pt daily if not multiple times per day if she is concerned about pt being alone.     Exercises       Shoulder Instructions      Home Living Family/patient expects to be discharged to:: Private residence Living Arrangements: Alone Available Help at Discharge: Family;Available PRN/intermittently;Personal care attendant Type of Home: Independent living facility [facility]) Home Access: Level  entry     Home Layout: One level     Bathroom Shower/Tub: Occupational psychologist: Standard Bathroom Accessibility: Yes How Accessible: Accessible via [place] Home Equipment: Bend - 2 wheels;Cane - single point;Grab bars - toilet;Grab bars - tub/shower;Shower seat - built Hotel manager: [place] Additional Comments: Son and daughter stop by daily; PCA/HH RN stops by daily (9-11 and 7-9) to assist with medications.      Prior Functioning/Environment Level of Independence: Independent with assistive device(s)        Comments: Typically does not use AD despite family encouragement, uses SPC mostly and ocassionally RW. Has hx of frequent falls with injury (x3 in bathroom and some attempting to pick up objects from floor).      OT Diagnosis: Generalized weakness;Acute pain;Cognitive deficits   OT Problem List: Decreased strength;Decreased range of motion;Decreased activity tolerance;Impaired balance (sitting and/or standing);Decreased coordination;Decreased safety awareness;Decreased knowledge of use of DME or AE;Pain   OT Treatment/Interventions:      OT Goals(Current goals can be found in the care plan section) Acute Rehab OT Goals Patient Stated Goal: to go home today OT Goal Formulation: With patient Time For Goal Achievement: 08/06/15 Potential to Achieve Goals: Good  OT Frequency:     Barriers to D/C:            Co-evaluation              End of Session Equipment Utilized During Treatment: Gait belt;Rolling [place] Nurse Communication: Mobility status  Activity Tolerance: Patient tolerated treatment well Patient left: in chair;with call bell/phone within reach;with chair alarm set;with family/visitor  present   Time: [id number] OT Time Calculation (min): 29 min Charges:  OT General Charges $OT Visit: 1 Procedure OT Evaluation $OT Eval Moderate Complexity: 1 Procedure OT Treatments $Self Care/Home Management : 8-22  mins G-Codes:    Redmond Baseman, OTR/L Pager: [phone] 07/23/2015, 2:59 PM

## 2015-07-23 NOTE — Care Management Important Message (Signed)
Important Message  Patient Details  Name: Shelly Ross MRN: [id number] Date of Birth: [birth year]   Medicare Important Message Given:  Yes    Shelly Ross 07/23/2015, 4:31 PM

## 2015-07-23 NOTE — Progress Notes (Signed)
Per Claiborne Billings at [place], patient has been accepted into their [facility]. Prior to admission, patient was in the independent living facility at [facility]. Facility is requesting FL2 and Discharge Summary to be sent via Story system. Patient is able to arrive at the facility at anytime.  CSW informed patient and family of arrangements. Family is appreciative of the CSW services provided.   [facility] to transport patient to facility at anytime now. RN is aware. No further CSW needs were requested at this time. CSW to sign off.   Lucius Conn, [place] Worker [facility] Ph: [phone]

## 2015-07-23 NOTE — Progress Notes (Signed)
PT Cancellation Note  Patient Details Name: Shelly Ross MRN: [id number] DOB: 08/09/1924   Cancelled Treatment:    Reason Eval/Treat Not Completed: Patient declined, no reason specifiedPatient reported that she would like to finish her lunch that came late. PT will check on pt later as time allows.    Salina April, PTA Pager: [phone]   07/23/2015, 3:24 PM

## 2015-07-23 NOTE — NC FL2 (Signed)
[place] LEVEL OF CARE SCREENING TOOL     IDENTIFICATION  Patient Name: Shelly Ross Birthdate: May 05, 1925 Sex: female Admission Date (Current Location): 07/20/2015  [facility]: [place] W and [place] Number:  Herbalist and Address:  [facility] [place]. [facility], [place] [address], [place], [place] [address]      Provider Number: [id number]  Attending Physician Name and Address:  Janece Canterbury, MD  Relative Name and Phone Number:       Current Level of Care:   Recommended Level of Care:   Prior Approval Number:    Date Approved/Denied: 04/18/13 PASRR Number: [id number]  Discharge Plan: SNF    Current Diagnoses: Patient Active Problem List   Diagnosis Date Noted  . UTI (urinary tract infection) 07/21/2015  . Sepsis secondary to UTI ([place])   . Second degree AV block   . HLD (hyperlipidemia)   . Acute UTI 07/20/2015  . Elevated lactic acid level 07/20/2015  . Pyelonephritis 07/20/2015  . Nausea and vomiting 07/20/2015  . Fall 05/28/2014  . Multiple rib fractures 05/24/2014  . Hyponatremia 04/21/2013  . Postoperative anemia due to acute blood loss 04/21/2013  . Hypokalemia 04/21/2013  . AV block, 2nd degree 04/18/2013  . OA (osteoarthritis) of knee 04/17/2013  . DJD (degenerative joint disease) of knee 02/15/2013  . Diarrhea 02/10/2013  . Hypercalcemia 01/17/2013  . Memory loss 09/09/2012  . Varicose veins of lower extremities with other complications [id number]  . ANXIETY 07/23/2010  . DEGENERATIVE DISC DISEASE, LUMBAR SPINE 07/09/2009  . EPISTAXIS 07/09/2009  . OSTEOPENIA 02/08/2009  . PALPITATIONS, RECURRENT 10/22/2008  . Essential hypertension 01/12/2008  . PREMATURE VENTRICULAR CONTRACTIONS 01/12/2008  . DIZZINESS 01/12/2008  . Hypothyroidism 07/18/2007  . HYPERLIPIDEMIA 07/18/2007  . GERD 07/18/2007  . DEGENERATIVE DISC DISEASE, CERVICAL SPINE, W/RADICULOPATHY 07/18/2007    Orientation RESPIRATION BLADDER Height & Weight      Self, Time, Situation, Place  Normal Continent Weight: 129 lb 8 oz (58.741 kg) Height:     BEHAVIORAL SYMPTOMS/MOOD NEUROLOGICAL BOWEL NUTRITION STATUS      Continent    AMBULATORY STATUS COMMUNICATION OF NEEDS Skin   Limited Assist Verbally Normal                       Personal Care Assistance Level of Assistance  Bathing, Feeding, Dressing Bathing Assistance: Limited assistance Feeding assistance: Independent Dressing Assistance: Limited assistance     Functional Limitations Info  Sight, Hearing, Speech Sight Info: Adequate Hearing Info: Impaired Speech Info: Adequate    SPECIAL CARE FACTORS FREQUENCY  OT (By licensed OT), PT (By licensed PT)                    Contractures      Additional Factors Info  Code Status, Allergies Code Status Info: FULL Allergies Info: Lansoprazole, Percocet, Other           Current Medications (07/23/2015):  This is the current hospital active medication list Current Facility-Administered Medications  Medication Dose Route Frequency Provider Last Rate Last Dose  . acetaminophen (TYLENOL) tablet 650 mg  650 mg Oral Q6H PRN Samella Parr, NP   650 mg at 07/22/15 2056   Or  . acetaminophen (TYLENOL) suppository 650 mg  650 mg Rectal Q6H PRN Samella Parr, NP      . atorvastatin (LIPITOR) tablet 10 mg  10 mg Oral Daily Samella Parr, NP   10 mg at 07/23/15 0840  . cefTRIAXone (  ROCEPHIN) 1 g in dextrose 5 % 50 mL IVPB  1 g Intravenous Q24H Debbe Odea, MD   1 g at 07/22/15 1141  . enoxaparin (LOVENOX) injection 40 mg  40 mg Subcutaneous Q24H Samella Parr, NP   40 mg at 07/22/15 2100  . fesoterodine (TOVIAZ) tablet 8 mg  8 mg Oral Daily Samella Parr, NP   8 mg at 07/23/15 0840  . levothyroxine (SYNTHROID, LEVOTHROID) tablet 37.5 mcg  37.5 mcg Oral Once per day on Sun Mon Tue Thu Fri Sat Samella Parr, NP   37.5 mcg at 07/23/15 [id number]  . [START ON 07/24/2015] levothyroxine (SYNTHROID, LEVOTHROID) tablet 75 mcg  75 mcg  Oral Once per day on Wed Samella Parr, NP      . ondansetron [facility]) injection 4 mg  4 mg Intravenous Q6H PRN Samella Parr, NP   4 mg at 07/20/15 2052  . prochlorperazine (COMPAZINE) injection 10 mg  10 mg Intravenous Q6H PRN Gareth Morgan, MD   10 mg at 07/21/15 1217     Discharge Medications: Please see discharge summary for a list of discharge medications.  Relevant Imaging Results:  Relevant Lab Results:   Additional Information SS#: [id number]  Raymondo Band, LCSW

## 2015-07-23 NOTE — Discharge Summary (Signed)
Physician Discharge Summary  Shelly Ross FTD:[id number] DOB: September 08, 1924 DOA: 07/20/2015  PCP: No primary care provider on file.  Admit date: 07/20/2015 Discharge date: 07/23/2015  Recommendations for Outpatient Follow-up:  1. No PT follow up, back to ILF 2. Continue keflex through 2/24, then stop 3. Start florastor probiotic  Discharge Diagnoses:  Principal Problem:   Acute UTI Active Problems:   Hypothyroidism   HYPERLIPIDEMIA   Essential hypertension   Memory loss   Elevated lactic acid level   Pyelonephritis   Nausea and vomiting   UTI (urinary tract infection)   Sepsis secondary to UTI (HCC)   Second degree AV block   HLD (hyperlipidemia)   Discharge Condition: stable, improved  Diet recommendation: regular  Wt Readings from Last 3 Encounters:  07/22/15 58.741 kg (129 lb 8 oz)  07/26/14 57.153 kg (126 lb)  05/24/14 57.8 kg (127 lb 6.8 oz)    History of present illness:   82 year old WF PMHx Resident [place], Chronic Pain Syndrome, HTN, Pulmonary Hypertension, Second-degree AV block/Bradycardia S/P Pacer Placement PVC, HLD, hypothyroidism, Stricture or Stenosis of esophagus who awoke the morning of admission with nausea and vomiting.  She was pale and diaphoretic and staff transported her to the ER.  Patient had been having dysuria and foul-smelling urine as well as some suprapubic discomfort for about 2-3 days.  She had also recently been having headache and labile blood pressures and so her family stopped her namenda.    Hospital Course:   Sepsis due to probable UTI.  Upon admission patient met criteria for sepsis WBC> 12, RR>, temp> 38C.  Her story and urinalysis were concerning for UTI.  Her lactic acid was elevated.  She was started on ceftriaxone and IVF and her symptoms gradually improved.  Her fevers resolved and her WBC trended down.  Her urine culture grew mixed flora, however, I feel that ultimately UTI was the source of her infection.     Nausea and vomiting, likely related to sepsis and resolved with IVF and antibiotics.    Hypothyroidism, stable, continued levothyroxine.  Essential hypertension, blood pressure was mildly elevated.  Her ECHO demonstrated preserved EF, grade 1 DD, suggestion of intraventricular conduction delay c/w paced rhythm and heart block, mild MR,and mildly elevated PA pressure 62mHg.  Given age and memory loss and acute illness, defer further management to PCP.    Second-degree AV block S/P pacer placement, Per notes patient seen by cardiology 1/18 for interrogation by Dr. JThompson Grayer   Pulmonary hypertension/ mild Tricuspid valve Regurgitation  Hypokalemia, given oral potassium repletion.    HYPERLIPIDEMIA, stable, continued Lipitor 10 mg daily   Memory loss, mild and resides at facility, taken off namenda recently by family due to labile blood pressures/orthostatic hypotension.    Mild nongap metabolic acidosis likely related to IVF and resolved.     Consultants:  None  Procedures:  ECHO  Antibiotics:  Ceftriaxone 2/18  Discharge Exam: Filed Vitals:   07/22/15 2149 07/23/15 0543  BP: 158/72 155/85  Pulse: 74 84  Temp: 98 F (36.7 C) 98.1 F (36.7 C)  Resp: 17 16   Filed Vitals:   07/22/15 0500 07/22/15 1400 07/22/15 2149 07/23/15 0543  BP: 145/69 155/75 158/72 155/85  Pulse: 77 67 74 84  Temp: 98.1 F (36.7 C) 98.4 F (36.9 C) 98 F (36.7 C) 98.1 F (36.7 C)  TempSrc: Oral Oral Oral Oral  Resp: 16 16 17 16   Weight: 58.741 kg (129 lb 8 oz)  SpO2: 98% 100% 100% 96%     General: Adult female, No acute distress  HEENT: NCAT, MMM  Cardiovascular: RRR, nl S1, S2 no mrg, 2+ pulses, warm extremities  Respiratory: CTAB, no increased WOB  Abdomen: NABS, soft, NT/ND  MSK: Normal tone and bulk, no LEE  Neuro: Grossly intact  Discharge Instructions      Discharge Instructions    Call MD for:  difficulty breathing, headache or visual  disturbances    Complete by:  As directed      Call MD for:  extreme fatigue    Complete by:  As directed      Call MD for:  hives    Complete by:  As directed      Call MD for:  persistant dizziness or light-headedness    Complete by:  As directed      Call MD for:  persistant nausea and vomiting    Complete by:  As directed      Call MD for:  severe uncontrolled pain    Complete by:  As directed      Call MD for:  temperature >100.4    Complete by:  As directed      Diet general    Complete by:  As directed      Increase activity slowly    Complete by:  As directed             Medication List    STOP taking these medications        traMADol 50 MG tablet  Commonly known as:  ULTRAM      TAKE these medications        acetaminophen 500 MG tablet  Commonly known as:  TYLENOL  Take 500 mg by mouth every 6 (six) hours as needed for moderate pain.     atorvastatin 20 MG tablet  Commonly known as:  LIPITOR  Take 10 mg by mouth daily.     cephALEXin 500 MG capsule  Commonly known as:  KEFLEX  Take 1 capsule (500 mg total) by mouth 2 (two) times daily.     CRANBERRY PO  Take 1 tablet by mouth every morning.     levothyroxine 75 MCG tablet  Commonly known as:  SYNTHROID, LEVOTHROID  Take 37.5-75 mcg by mouth daily before breakfast. Take 1/2 tablet (37.5 mcg) every morning except Wednesday, take 1 tablet (75 mcg) on Wednesday     saccharomyces boulardii 250 MG capsule  Commonly known as:  FLORASTOR  Take 1 capsule (250 mg total) by mouth 2 (two) times daily.     tolterodine 4 MG 24 hr capsule  Commonly known as:  DETROL LA  Take 4 mg by mouth daily.          The results of significant diagnostics from this hospitalization (including imaging, microbiology, ancillary and laboratory) are listed below for reference.    Significant Diagnostic Studies: Dg Chest 2 View  07/20/2015  CLINICAL DATA:  Chills, emesis. EXAM: CHEST  2 VIEW COMPARISON:  05/26/2014 chest  radiograph. FINDINGS: Two lead left subclavian pacemaker is stable in configuration with lead tips overlying the right atrium and right ventricle. Stable cardiomediastinal silhouette with normal heart size. No pneumothorax. No pleural effusion. Mild scarring versus atelectasis at the left lung base. No pulmonary edema. No acute consolidative airspace disease. Healed deformities are seen in several lateral left ribs. IMPRESSION: Mild scarring versus atelectasis at the left lung base. Otherwise no active disease in the chest. Electronically  Signed   By: Ilona Sorrel M.D.   On: 07/20/2015 11:35    Microbiology: Recent Results (from the past 240 hour(s))  Urine culture     Status: None   Collection Time: 07/20/15  9:22 AM  Result Value Ref Range Status   Specimen Description URINE, CLEAN CATCH  Final   Special Requests NONE  Final   Culture MULTIPLE SPECIES PRESENT, SUGGEST RECOLLECTION  Final   Report Status 07/21/2015 FINAL  Final  Blood culture (routine x 2)     Status: None (Preliminary result)   Collection Time: 07/20/15 12:20 PM  Result Value Ref Range Status   Specimen Description BLOOD RIGHT HAND  Final   Special Requests PEDIATRICS 3CC  Final   Culture NO GROWTH 2 DAYS  Final   Report Status PENDING  Incomplete  Blood culture (routine x 2)     Status: None (Preliminary result)   Collection Time: 07/20/15 12:24 PM  Result Value Ref Range Status   Specimen Description BLOOD LEFT HAND  Final   Special Requests BOTTLES DRAWN AEROBIC ONLY 5CC  Final   Culture NO GROWTH 2 DAYS  Final   Report Status PENDING  Incomplete     Labs: Basic Metabolic Panel:  Recent Labs Lab 07/20/15 0905 07/21/15 0515 07/22/15 0643 07/23/15 0648  NA 138 143 142 143  K 3.7 3.2* 3.4* 3.4*  CL 102 113* 117* 113*  CO2 24 23 19* 23  GLUCOSE 158* 92 97 94  BUN 13 18 7  <5*  CREATININE 0.81 0.82 0.62 0.64  CALCIUM 10.0 8.5* 8.6* 9.3  MG  --   --  2.0 1.9   Liver Function Tests:  Recent Labs Lab  07/20/15 0905  AST 23  ALT 21  ALKPHOS 64  BILITOT 1.0  PROT 6.8  ALBUMIN 3.6   No results for input(s): LIPASE, AMYLASE in the last 168 hours. No results for input(s): AMMONIA in the last 168 hours. CBC:  Recent Labs Lab 07/20/15 0905 07/21/15 0515 07/22/15 0643 07/23/15 0648  WBC 13.7* 12.2* 7.3 6.6  NEUTROABS 12.1*  --  4.8 3.8  HGB 16.4* 13.5 12.2 13.7  HCT 48.5* 42.6 38.0 43.2  MCV 92.7 93.2 93.4 91.7  PLT 248 227 189 274   Cardiac Enzymes: No results for input(s): CKTOTAL, CKMB, CKMBINDEX, TROPONINI in the last 168 hours. BNP: BNP (last 3 results) No results for input(s): BNP in the last 8760 hours.  ProBNP (last 3 results) No results for input(s): PROBNP in the last 8760 hours.  CBG: No results for input(s): GLUCAP in the last 168 hours.  Time coordinating discharge: 35 minutes  Signed:  Frady Taddeo  [facility] 07/23/2015, 12:03 PM

## 2015-07-25 LAB — CULTURE, BLOOD (ROUTINE X 2)
CULTURE: NO GROWTH
CULTURE: NO GROWTH

## 2015-08-12 ENCOUNTER — Ambulatory Visit (INDEPENDENT_AMBULATORY_CARE_PROVIDER_SITE_OTHER): Payer: Medicare Other | Admitting: Internal Medicine

## 2015-08-12 ENCOUNTER — Encounter: Payer: Self-pay | Admitting: Internal Medicine

## 2015-08-12 VITALS — BP 142/94 | HR 83 | Ht 61.5 in | Wt 124.2 lb

## 2015-08-12 DIAGNOSIS — I441 Atrioventricular block, second degree: Secondary | ICD-10-CM | POA: Diagnosis not present

## 2015-08-12 DIAGNOSIS — I10 Essential (primary) hypertension: Secondary | ICD-10-CM

## 2015-08-12 NOTE — Patient Instructions (Signed)
Medication Instructions:  Your physician recommends that you continue on your current medications as directed. Please refer to the Current Medication list given to you today.    Labwork: None ordered   Testing/Procedures: None ordered   Follow-Up: Your physician wants you to follow-up in: 12 months with Chanetta Marshall, NP You will receive a reminder letter in the mail two months in advance. If you don't receive a letter, please call our office to schedule the follow-up appointment.   Remote monitoring is used to monitor your Pacemaker from home. This monitoring reduces the number of office visits required to check your device to one time per year. It allows [place] to keep an eye on the functioning of your device to ensure it is working properly. You are scheduled for a device check from home on 11/16/15. You may send your transmission at any time that day. If you have a wireless device, the transmission will be sent automatically. After your physician reviews your transmission, you will receive a postcard with your next transmission date.    Any Other Special Instructions Will Be Listed Below (If Applicable).     If you need a refill on your cardiac medications before your next appointment, please call your pharmacy.

## 2015-08-12 NOTE — Progress Notes (Signed)
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  She is doing reasonably well.  Occasional waterbrash sensation of reflux.  Denies exertional chest pain.  Today, she denies symptoms of palpitations, shortness of breath,  lower extremity edema, dizziness, presyncope, or syncope.  The patient is otherwise without complaint today.   Past Medical History  Diagnosis Date  . Chronic low back pain   . Dizziness     some  . Hypertension   . Neuralgia, neuritis, and radiculitis, unspecified   . Degenerative disc disease     CERVICAL SPINE,W/RADICULOPATHY  . GERD (gastroesophageal reflux disease)   . Hyperlipidemia   . Thyroid disease     HYPOTHYROIDISM  . Osteoporosis   . Rib fractures   . Stricture and stenosis of esophagus 2007  . Adenomatous colon polyp 2003  . Premature ventricular contractions 2006  . Hypothyroidism   . Headache(784.0)     not migraines, but a lot of headaches  . SVT (supraventricular tachycardia) ([place])     S/P ablation 2005   Past Surgical History  Procedure Laterality Date  . Abdominal hysterectomy      WITH OOPHORECTOMY,? BILATERAL FOR ENDOMETRIOSIS  . Breast enhancement surgery    . G2 p2    . Bladder tacking    . Endovenous ablation saphenous vein w/ laser  07-13-2011    right greater saphenous vein, Dr Mamie Nick  . Endovenous ablation saphenous vein w/ laser  08/2011    L ; Dr Kellie Simmering  . Cataract extraction Bilateral   . Esophageal dilation       X 1  . Colonoscopy    . Cardiac electrophysiology mapping and ablation    . Total knee arthroplasty Right 04/17/2013    Procedure: RIGHT TOTAL KNEE ARTHROPLASTY, CORTISONE INJECTION LEFT KNEE;  Surgeon: Gearlean Alf, MD;  Location: [facility];  Service: Orthopedics;  Laterality: Right;  . Pacemaker insertion  04/19/13    MDT Adapta L iimplanted by Dr Rayann Heman for mobitz II second degree AV block  . Permanent pacemaker insertion N/A 04/19/2013   Procedure: PERMANENT PACEMAKER INSERTION;  Surgeon: Coralyn Mark, MD;  Location: [facility];  Service: Cardiovascular;  Laterality: N/A;  . Insert / replace / remove pacemaker      Current Outpatient Prescriptions  Medication Sig Dispense Refill  . atorvastatin (LIPITOR) 20 MG tablet Take 10 mg by mouth daily.    Marland Kitchen CRANBERRY PO Take 1 tablet by mouth every morning.     Marland Kitchen levothyroxine (SYNTHROID, LEVOTHROID) 75 MCG tablet Take 37.5-75 mcg by mouth daily before breakfast. Take 1/2 tablet (37.5 mcg) every morning except Wednesday, take 1 tablet (75 mcg) on Wednesday  2  . saccharomyces boulardii (FLORASTOR) 250 MG capsule Take 1 capsule (250 mg total) by mouth 2 (two) times daily. 60 capsule 3  . tolterodine (DETROL LA) 4 MG 24 hr capsule Take 4 mg by mouth daily.  0  . traMADol (ULTRAM) 50 MG tablet Take 1 tablet by mouth 2 (two) times daily.  0   No current facility-administered medications for this visit.    Physical Exam: Filed Vitals:   08/12/15 1117  BP: 142/94  Pulse: 83  Height: 5' 1.5" (1.562 m)  Weight: 124 lb 3.2 oz (56.337 kg)    GEN- The patient is well appearing, alert and oriented x 3 today.   Head- normocephalic, atraumatic Eyes-  Sclera clear, conjunctiva pink Ears- hearing intact Oropharynx-  clear Lungs- Clear to ausculation bilaterally, normal work of breathing Chest- pacemaker pocket is well healed Heart- Regular rate and rhythm, no murmurs, rubs or gallops, PMI not laterally displaced GI- soft, NT, ND, + BS Extremities- no clubbing, cyanosis, or edema  Pacemaker interrogation- reviewed in detail today,  See PACEART report  Assessment and Plan:  1. Mobitz II second degree AV block--> complete heart block Normal pacemaker function See Pace Art report MVP turned off today  2. HTN Stable No change required today  3. Fall stable  carelink Return to see EP NP in 12 months  Thompson Grayer MD, [place] 08/12/2015 11:33 AM

## 2015-08-15 ENCOUNTER — Telehealth: Payer: Self-pay | Admitting: *Deleted

## 2015-10-14 ENCOUNTER — Other Ambulatory Visit: Payer: Self-pay | Admitting: Internal Medicine

## 2015-10-14 DIAGNOSIS — R112 Nausea with vomiting, unspecified: Secondary | ICD-10-CM

## 2015-10-14 DIAGNOSIS — K219 Gastro-esophageal reflux disease without esophagitis: Secondary | ICD-10-CM

## 2015-10-16 ENCOUNTER — Other Ambulatory Visit: Payer: Self-pay | Admitting: Internal Medicine

## 2015-10-16 ENCOUNTER — Ambulatory Visit
Admission: RE | Admit: 2015-10-16 | Discharge: 2015-10-16 | Disposition: A | Discharge status: Home / Self Care | Payer: Medicare Other | Source: Ambulatory Visit | Attending: Internal Medicine | Admitting: Internal Medicine

## 2015-10-16 DIAGNOSIS — K219 Gastro-esophageal reflux disease without esophagitis: Secondary | ICD-10-CM

## 2015-10-16 DIAGNOSIS — R112 Nausea with vomiting, unspecified: Secondary | ICD-10-CM

## 2015-11-08 LAB — CUP PACEART REMOTE DEVICE CHECK
Battery Impedance: 132 Ohm
Battery Remaining Longevity: 132 mo
Battery Voltage: 2.79 V
Brady Statistic AP VS Percent: 0 %
Implantable Lead Implant Date: 20141119
Implantable Lead Location: 753859
Lead Channel Impedance Value: 786 Ohm
Lead Channel Setting Pacing Pulse Width: 0.4 ms
MDC IDC LEAD IMPLANT DT: 20141119
MDC IDC LEAD LOCATION: 753860
MDC IDC MSMT LEADCHNL RA IMPEDANCE VALUE: 655 Ohm
MDC IDC SESS DTM: 20170118145540
MDC IDC SET LEADCHNL RA PACING AMPLITUDE: 2 V
MDC IDC SET LEADCHNL RV PACING AMPLITUDE: 2.5 V
MDC IDC SET LEADCHNL RV SENSING SENSITIVITY: 5.6 mV
MDC IDC STAT BRADY AP VP PERCENT: 28 %
MDC IDC STAT BRADY AS VP PERCENT: 70 %
MDC IDC STAT BRADY AS VS PERCENT: 2 %

## 2015-11-08 LAB — CUP PACEART INCLINIC DEVICE CHECK
Date Time Interrogation Session: 20170609091818
Implantable Lead Implant Date: 20141119
Implantable Lead Model: 5092
Implantable Lead Model: 5592
MDC IDC LEAD IMPLANT DT: 20141119
MDC IDC LEAD LOCATION: 753859
MDC IDC LEAD LOCATION: 753860

## 2015-11-11 ENCOUNTER — Telehealth: Payer: Self-pay | Admitting: Cardiology

## 2015-11-11 ENCOUNTER — Ambulatory Visit (INDEPENDENT_AMBULATORY_CARE_PROVIDER_SITE_OTHER): Payer: Medicare Other | Admitting: *Deleted

## 2015-11-11 DIAGNOSIS — I441 Atrioventricular block, second degree: Secondary | ICD-10-CM

## 2015-11-11 NOTE — Progress Notes (Signed)
Remote pacemaker transmission.   

## 2015-11-11 NOTE — Telephone Encounter (Signed)
Spoke with pt and reminded pt of remote transmission that is due today. Pt verbalized understanding.   

## 2015-11-14 LAB — CUP PACEART REMOTE DEVICE CHECK
Battery Impedance: 156 Ohm
Battery Remaining Longevity: 127 mo
Brady Statistic AP VP Percent: 10 %
Brady Statistic AS VS Percent: 0 %
Implantable Lead Implant Date: 20141119
Implantable Lead Location: 753860
Implantable Lead Model: 5092
Lead Channel Impedance Value: 644 Ohm
Lead Channel Impedance Value: 744 Ohm
Lead Channel Pacing Threshold Pulse Width: 0.4 ms
Lead Channel Sensing Intrinsic Amplitude: 1.4 mV
Lead Channel Setting Pacing Pulse Width: 0.4 ms
MDC IDC LEAD IMPLANT DT: 20141119
MDC IDC LEAD LOCATION: 753859
MDC IDC MSMT BATTERY VOLTAGE: 2.79 V
MDC IDC MSMT LEADCHNL RA PACING THRESHOLD AMPLITUDE: 0.875 V
MDC IDC MSMT LEADCHNL RV PACING THRESHOLD AMPLITUDE: 0.5 V
MDC IDC MSMT LEADCHNL RV PACING THRESHOLD PULSEWIDTH: 0.4 ms
MDC IDC SESS DTM: 20170612145445
MDC IDC SET LEADCHNL RA PACING AMPLITUDE: 2 V
MDC IDC SET LEADCHNL RV PACING AMPLITUDE: 2.5 V
MDC IDC SET LEADCHNL RV SENSING SENSITIVITY: 5.6 mV
MDC IDC STAT BRADY AP VS PERCENT: 0 %
MDC IDC STAT BRADY AS VP PERCENT: 89 %

## 2015-11-21 ENCOUNTER — Encounter: Payer: Self-pay | Admitting: Cardiology

## 2016-01-20 NOTE — Telephone Encounter (Signed)
error 

## 2016-01-26 ENCOUNTER — Emergency Department (HOSPITAL_COMMUNITY): Payer: Medicare Other

## 2016-01-26 ENCOUNTER — Encounter (HOSPITAL_COMMUNITY): Payer: Self-pay | Admitting: Emergency Medicine

## 2016-01-26 ENCOUNTER — Emergency Department (HOSPITAL_COMMUNITY)
Admission: EM | Admit: 2016-01-26 | Discharge: 2016-01-27 | Disposition: A | Discharge status: Home / Self Care | Payer: Medicare Other | Attending: Emergency Medicine | Admitting: Emergency Medicine

## 2016-01-26 DIAGNOSIS — F0390 Unspecified dementia without behavioral disturbance: Secondary | ICD-10-CM | POA: Diagnosis not present

## 2016-01-26 DIAGNOSIS — S4992XA Unspecified injury of left shoulder and upper arm, initial encounter: Secondary | ICD-10-CM | POA: Diagnosis present

## 2016-01-26 DIAGNOSIS — I10 Essential (primary) hypertension: Secondary | ICD-10-CM | POA: Diagnosis not present

## 2016-01-26 DIAGNOSIS — E039 Hypothyroidism, unspecified: Secondary | ICD-10-CM | POA: Insufficient documentation

## 2016-01-26 DIAGNOSIS — W19XXXA Unspecified fall, initial encounter: Secondary | ICD-10-CM

## 2016-01-26 DIAGNOSIS — S40012A Contusion of left shoulder, initial encounter: Secondary | ICD-10-CM | POA: Diagnosis not present

## 2016-01-26 DIAGNOSIS — Y939 Activity, unspecified: Secondary | ICD-10-CM | POA: Diagnosis not present

## 2016-01-26 DIAGNOSIS — Z79899 Other long term (current) drug therapy: Secondary | ICD-10-CM | POA: Diagnosis not present

## 2016-01-26 DIAGNOSIS — S0003XA Contusion of scalp, initial encounter: Secondary | ICD-10-CM | POA: Insufficient documentation

## 2016-01-26 DIAGNOSIS — S0990XA Unspecified injury of head, initial encounter: Secondary | ICD-10-CM

## 2016-01-26 DIAGNOSIS — W1800XA Striking against unspecified object with subsequent fall, initial encounter: Secondary | ICD-10-CM | POA: Diagnosis not present

## 2016-01-26 DIAGNOSIS — Z950 Presence of cardiac pacemaker: Secondary | ICD-10-CM | POA: Diagnosis not present

## 2016-01-26 DIAGNOSIS — Y92009 Unspecified place in unspecified non-institutional (private) residence as the place of occurrence of the external cause: Secondary | ICD-10-CM | POA: Diagnosis not present

## 2016-01-26 DIAGNOSIS — Y999 Unspecified external cause status: Secondary | ICD-10-CM | POA: Insufficient documentation

## 2016-01-26 HISTORY — DX: Unspecified dementia, unspecified severity, without behavioral disturbance, psychotic disturbance, mood disturbance, and anxiety: F03.90

## 2016-01-26 NOTE — ED Provider Notes (Signed)
[facility] DEPT Provider Note   CSN: [id number] Arrival date & time: 01/26/16  1857  By signing my name below, I, Jasmyn B. Alexander, attest that this documentation has been prepared under the direction and in the presence of Chevette Fee, PA-C. Electronically Signed: Tedra Coupe. Sheppard Coil, ED Scribe. 01/26/16. 12:05 AM.  History   Chief Complaint Chief Complaint  Patient presents with  . Fall    HPI HPI Comments: Shelly Ross is a 82 y.o. female with PMHx of Dementia, HLD, HTN, and osteoporosis who presents to the Emergency Department complaining of sudden onset, constant, moderate bilateral shoulder pain s/p fall that occurred PTA. Per pt's son, he reports that pt was attempting to look under her bed to see if anything was under it when she fell onto the ground. Denies any LOC. He notes that she hit her head, shoulders, and sustained a small laceration to the bridge of her nose from her glasses. She notes that left shoulder is more painful than right. No alleviating factors noted. She is not on any anticoagulants. Denies any lightheadedness or dizziness.  The history is provided by the patient and a relative. No language interpreter was used.    Past Medical History:  Diagnosis Date  . Adenomatous colon polyp 2003  . Chronic low back pain   . Degenerative disc disease    CERVICAL SPINE,W/RADICULOPATHY  . Dementia   . Dizziness    some  . GERD (gastroesophageal reflux disease)   . Headache(784.0)    not migraines, but a lot of headaches  . Hyperlipidemia   . Hypertension   . Hypothyroidism   . Neuralgia, neuritis, and radiculitis, unspecified   . Osteoporosis   . Premature ventricular contractions 2006  . Rib fractures   . Stricture and stenosis of esophagus 2007  . SVT (supraventricular tachycardia) ([place])    S/P ablation 2005  . Thyroid disease    HYPOTHYROIDISM    Patient Active Problem List   Diagnosis Date Noted  . UTI (urinary tract infection)  07/21/2015  . Sepsis secondary to UTI ([place])   . Second degree AV block   . HLD (hyperlipidemia)   . Acute UTI 07/20/2015  . Elevated lactic acid level 07/20/2015  . Pyelonephritis 07/20/2015  . Nausea and vomiting 07/20/2015  . Fall 05/28/2014  . Multiple rib fractures 05/24/2014  . Hyponatremia 04/21/2013  . Postoperative anemia due to acute blood loss 04/21/2013  . Hypokalemia 04/21/2013  . AV block, 2nd degree 04/18/2013  . OA (osteoarthritis) of knee 04/17/2013  . DJD (degenerative joint disease) of knee 02/15/2013  . Diarrhea 02/10/2013  . Hypercalcemia 01/17/2013  . Memory loss 09/09/2012  . Varicose veins of lower extremities with other complications [id number]  . ANXIETY 07/23/2010  . DEGENERATIVE DISC DISEASE, LUMBAR SPINE 07/09/2009  . EPISTAXIS 07/09/2009  . OSTEOPENIA 02/08/2009  . PALPITATIONS, RECURRENT 10/22/2008  . Essential hypertension 01/12/2008  . PREMATURE VENTRICULAR CONTRACTIONS 01/12/2008  . DIZZINESS 01/12/2008  . Hypothyroidism 07/18/2007  . HYPERLIPIDEMIA 07/18/2007  . GERD 07/18/2007  . DEGENERATIVE DISC DISEASE, CERVICAL SPINE, W/RADICULOPATHY 07/18/2007    Past Surgical History:  Procedure Laterality Date  . ABDOMINAL HYSTERECTOMY     WITH OOPHORECTOMY,? BILATERAL FOR ENDOMETRIOSIS  . BLADDER TACKING    . BREAST ENHANCEMENT SURGERY    . CARDIAC ELECTROPHYSIOLOGY MAPPING AND ABLATION    . CATARACT EXTRACTION Bilateral   . COLONOSCOPY    . ENDOVENOUS ABLATION SAPHENOUS VEIN W/ LASER  07-13-2011   right greater saphenous vein, Dr Lenna Sciara  [facility]  . ENDOVENOUS ABLATION SAPHENOUS VEIN W/ LASER  08/2011   L ; Dr Kellie Simmering  . esophageal dilation      X 1  . G2 P2    . INSERT / REPLACE / REMOVE PACEMAKER    . PACEMAKER INSERTION  04/19/13   MDT Adapta L iimplanted by Dr Rayann Heman for mobitz II second degree AV block  . PERMANENT PACEMAKER INSERTION N/A 04/19/2013   Procedure: PERMANENT PACEMAKER INSERTION;  Surgeon: Coralyn Mark, MD;  Location: [facility]  CATH LAB;  Service: Cardiovascular;  Laterality: N/A;  . TOTAL KNEE ARTHROPLASTY Right 04/17/2013   Procedure: RIGHT TOTAL KNEE ARTHROPLASTY, CORTISONE INJECTION LEFT KNEE;  Surgeon: Gearlean Alf, MD;  Location: [facility];  Service: Orthopedics;  Laterality: Right;    OB History    No data available       Home Medications    Prior to Admission medications   Medication Sig Start Date End Date Taking? Authorizing Provider  atorvastatin (LIPITOR) 10 MG tablet Take 10 mg by mouth at bedtime.   Yes Historical Provider, MD  busPIRone (BUSPAR) 5 MG tablet Take 5 mg by mouth 2 (two) times daily.   Yes Historical Provider, MD  Cranberry 1000 MG CAPS Take 1,000 mg by mouth daily.   Yes Historical Provider, MD  levothyroxine (SYNTHROID, LEVOTHROID) 75 MCG tablet Take 37.5-75 mcg by mouth daily before breakfast. Pt takes one-half tablet all days, but Wednesday -- she takes one whole tablet this day.   Yes Historical Provider, MD  pantoprazole (PROTONIX) 40 MG tablet Take 40 mg by mouth daily.   Yes Historical Provider, MD  tolterodine (DETROL LA) 4 MG 24 hr capsule Take 4 mg by mouth daily.   Yes Historical Provider, MD  traMADol (ULTRAM) 50 MG tablet Take 50 mg by mouth 3 (three) times daily.    Yes Historical Provider, MD    Family History Family History  Problem Relation Age of Onset  . Stroke Mother 73  . Heart attack Father     in 92s  . Deep vein thrombosis Brother     AND PTE  . Diabetes Brother   . Cancer Paternal Aunt      INTESTINAL   . Alzheimer's disease Sister   . Alzheimer's disease Brother   . Tuberculosis Brother   . Aneurysm Brother     cns  . Heart Problems    . COPD Sister     smoking  . Heart attack Sister 21    Social History Social History  Substance Use Topics  . Smoking status: Never Smoker  . Smokeless tobacco: Never Used  . Alcohol use No     Comment: wine, seldom      Allergies   Lansoprazole; Percocet [oxycodone-acetaminophen]; and  Other   Review of Systems Review of Systems  Musculoskeletal: Positive for arthralgias.  Skin: Positive for wound.  Neurological: Negative for dizziness, syncope and light-headedness.  All other systems reviewed and are negative.  Physical Exam Updated Vital Signs BP 159/93 (BP Location: Left Arm)   Pulse 83   Temp 97.7 F (36.5 C) (Oral)   Resp 18   Ht 5\' 4"  (1.626 m)   Wt 120 lb (54.4 kg)   SpO2 99%   BMI 20.60 kg/m   Physical Exam  Constitutional: She appears well-developed and well-nourished.  HENT:  Head: Normocephalic.  Right Ear: External ear normal.  Left Ear: External ear normal.  Mouth/Throat: Oropharynx is clear and moist.  Large  hematoma to the posterior scalp. No hemotympanum bilaterally.  Eyes: EOM are normal. Pupils are equal, round, and reactive to light.  Neck: Normal range of motion. Neck supple.  No midline tenderness  Cardiovascular: Normal rate, regular rhythm and normal heart sounds.   Pulmonary/Chest: Effort normal and breath sounds normal. No respiratory distress. She has no wheezes. She has no rales.  Abdominal: Soft. Bowel sounds are normal. She exhibits no distension. There is no tenderness. There is no rebound and no guarding.  Musculoskeletal: Normal range of motion.  Contusion to the left anterior shoulder. Tender to palpation over anterior and posterior shoulder joints. Pain with range of motion. Full passive range of motion of the left shoulder. Pain with active range of motion. Distal radial pulses intact. Normal elbow and wrist.  Neurological: She is alert.  Oriented to self. Full range of motion of all extremities. Grip is 5 out of 5 and equal Bilaterally. 5 out of 5 and equal upper and lower extremities bilaterally.  Skin: Skin is warm and dry. No rash noted.  Psychiatric: She has a normal mood and affect. Judgment normal.  Nursing note and vitals reviewed.  ED Treatments / Results  DIAGNOSTIC STUDIES: Oxygen Saturation is 99% on RA,  normal by my interpretation.    COORDINATION OF CARE: 9:56 PM-Discussed treatment plan which includes CT of head and C-spine with pt at bedside and pt agreed to plan.   Radiology Ct Head Wo Contrast  Result Date: 01/26/2016 CLINICAL DATA:  Ground level fall tonight. EXAM: CT HEAD WITHOUT CONTRAST CT CERVICAL SPINE WITHOUT CONTRAST TECHNIQUE: Multidetector CT imaging of the head and cervical spine was performed following the standard protocol without intravenous contrast. Multiplanar CT image reconstructions of the cervical spine were also generated. COMPARISON:  05/24/2014 FINDINGS: CT HEAD FINDINGS There is no intracranial hemorrhage, mass or evidence of acute infarction. There is mild generalized atrophy which is unchanged. There is extensive periventricular hypodensity which may represent chronic small vessel ischemic disease. The calvarium and skullbase are intact. There is mild supraorbital left frontal scalp thickening. CT CERVICAL SPINE FINDINGS The vertebral column, pedicles and facet articulations are intact. There is no evidence of acute fracture. No acute soft tissue abnormalities are evident. Mild degenerative disc changes are present, greatest at C5-6 and C6-7. IMPRESSION: 1. Negative for acute intracranial traumatic injury. 2. Mild generalized atrophy and chronic small vessel ischemic changes. 3. Negative for acute cervical spine fracture. Electronically Signed   By: Andreas Newport M.D.   On: 01/26/2016 23:58   Ct Cervical Spine Wo Contrast  Result Date: 01/26/2016 CLINICAL DATA:  Ground level fall tonight. EXAM: CT HEAD WITHOUT CONTRAST CT CERVICAL SPINE WITHOUT CONTRAST TECHNIQUE: Multidetector CT imaging of the head and cervical spine was performed following the standard protocol without intravenous contrast. Multiplanar CT image reconstructions of the cervical spine were also generated. COMPARISON:  05/24/2014 FINDINGS: CT HEAD FINDINGS There is no intracranial hemorrhage, mass or  evidence of acute infarction. There is mild generalized atrophy which is unchanged. There is extensive periventricular hypodensity which may represent chronic small vessel ischemic disease. The calvarium and skullbase are intact. There is mild supraorbital left frontal scalp thickening. CT CERVICAL SPINE FINDINGS The vertebral column, pedicles and facet articulations are intact. There is no evidence of acute fracture. No acute soft tissue abnormalities are evident. Mild degenerative disc changes are present, greatest at C5-6 and C6-7. IMPRESSION: 1. Negative for acute intracranial traumatic injury. 2. Mild generalized atrophy and chronic small vessel ischemic changes. 3.  Negative for acute cervical spine fracture. Electronically Signed   By: Andreas Newport M.D.   On: 01/26/2016 23:58   Dg Shoulder Left  Result Date: 01/26/2016 CLINICAL DATA:  Fall onto left shoulder today. EXAM: LEFT SHOULDER - 2+ VIEW COMPARISON:  None. FINDINGS: There is no evidence of fracture or dislocation. There is no evidence of arthropathy or other focal bone abnormality. Soft tissues are unremarkable. IMPRESSION: Negative. Electronically Signed   By: Misty Stanley M.D.   On: 01/26/2016 20:37   Procedures Procedures (including critical care time)  Initial Impression / Assessment and Plan / ED Course  I have reviewed the triage vital signs and the nursing notes.  Pertinent labs & imaging results that were available during my care of the patient were reviewed by me and considered in my medical decision making (see chart for details).  Clinical Course   Patient emergency department after mechanical fall. She is mildly demented, but at baseline  according to the family. She is in no acute distress. Left shoulder xray negative. CT head and cervical spine negative. Pt ambulatory. No distress. Stable for dc home.   Vitals:   01/26/16 1905 01/26/16 1906  BP: 159/93   Pulse: 83   Resp: 18   Temp: 97.7 F (36.5 C)    TempSrc: Oral   SpO2: 99%   Weight:  54.4 kg  Height:  5\' 4"  (1.626 m)        Final Clinical I mpressions(s) / ED Diagnoses   Final diagnoses:  Minor head injury, initial encounter  Contusion of left shoulder, initial encounter  Fall, initial encounter    New Prescriptions New Prescriptions   No medications on file   I personally performed the services described in this documentation, which was scribed in my presence. The recorded information has been reviewed and is accurate.    Jeannett Senior, PA-C 01/27/16 [place], DO 01/27/16 [id number]

## 2016-01-26 NOTE — ED Triage Notes (Addendum)
Pt from home with her son following a fall. Pt was looking under the bed to see if anyone was under there. She states that she then fell. Pt's son heard her screaming and walked in and saw her laying on the ground. Pt has a small cut where her glasses were on her face. Pt states she also hit the front and back of her head, but no abrasions or areas of swelling are noted. Per pt's son, pt is at baseline. Pt does not take blood thinners Pt is alert to person, place, and situation, but not time. This is baseline Pt denies LOC  Pt also has complaints of left shoulder pain.

## 2016-01-27 NOTE — Discharge Instructions (Signed)
Apply  ice pack to the scalp and left shoulder several times a day. Tylenol for pain as needed. Follow up with family doctor for recheck.

## 2016-01-27 NOTE — ED Notes (Signed)
Pt ambulated to restroom. 

## 2016-02-10 ENCOUNTER — Telehealth: Payer: Self-pay | Admitting: Cardiology

## 2016-02-10 ENCOUNTER — Ambulatory Visit (INDEPENDENT_AMBULATORY_CARE_PROVIDER_SITE_OTHER): Payer: Medicare Other | Admitting: *Deleted

## 2016-02-10 DIAGNOSIS — I441 Atrioventricular block, second degree: Secondary | ICD-10-CM

## 2016-02-10 NOTE — Telephone Encounter (Signed)
Spoke with pt and reminded pt of remote transmission that is due today. Pt verbalized understanding.   

## 2016-02-12 LAB — CUP PACEART REMOTE DEVICE CHECK
Battery Impedance: 156 Ohm
Brady Statistic AP VS Percent: 0 %
Brady Statistic AS VP Percent: 85 %
Brady Statistic AS VS Percent: 1 %
Implantable Lead Location: 753859
Implantable Lead Model: 5092
Implantable Lead Model: 5592
Lead Channel Impedance Value: 725 Ohm
Lead Channel Pacing Threshold Amplitude: 0.75 V
Lead Channel Pacing Threshold Pulse Width: 0.4 ms
Lead Channel Pacing Threshold Pulse Width: 0.4 ms
Lead Channel Setting Sensing Sensitivity: 5.6 mV
MDC IDC LEAD IMPLANT DT: 20141119
MDC IDC LEAD IMPLANT DT: 20141119
MDC IDC LEAD LOCATION: 753860
MDC IDC MSMT BATTERY REMAINING LONGEVITY: 126 mo
MDC IDC MSMT BATTERY VOLTAGE: 2.79 V
MDC IDC MSMT LEADCHNL RA IMPEDANCE VALUE: 633 Ohm
MDC IDC MSMT LEADCHNL RV PACING THRESHOLD AMPLITUDE: 0.5 V
MDC IDC SESS DTM: 20170911142856
MDC IDC SET LEADCHNL RA PACING AMPLITUDE: 2 V
MDC IDC SET LEADCHNL RV PACING AMPLITUDE: 2.5 V
MDC IDC SET LEADCHNL RV PACING PULSEWIDTH: 0.4 ms
MDC IDC STAT BRADY AP VP PERCENT: 14 %

## 2016-02-12 NOTE — Progress Notes (Signed)
Remote pacemaker transmission.   

## 2016-02-13 ENCOUNTER — Encounter: Payer: Self-pay | Admitting: Cardiology

## 2016-05-11 ENCOUNTER — Ambulatory Visit (INDEPENDENT_AMBULATORY_CARE_PROVIDER_SITE_OTHER): Payer: Medicare Other | Admitting: *Deleted

## 2016-05-11 ENCOUNTER — Telehealth: Payer: Self-pay | Admitting: Cardiology

## 2016-05-11 DIAGNOSIS — I441 Atrioventricular block, second degree: Secondary | ICD-10-CM

## 2016-05-11 NOTE — Telephone Encounter (Signed)
Confirmed remote transmission w/ pt caregiver.   

## 2016-05-12 NOTE — Progress Notes (Signed)
Remote pacemaker transmission.   

## 2016-05-15 ENCOUNTER — Encounter: Payer: Self-pay | Admitting: Cardiology

## 2016-05-21 LAB — CUP PACEART REMOTE DEVICE CHECK
Battery Impedance: 156 Ohm
Battery Voltage: 2.79 V
Brady Statistic AP VS Percent: 0 %
Date Time Interrogation Session: 20171211171855
Implantable Lead Implant Date: 20141119
Implantable Lead Location: 753859
Implantable Lead Model: 5092
Implantable Lead Model: 5592
Implantable Pulse Generator Implant Date: 20141119
Lead Channel Impedance Value: 779 Ohm
Lead Channel Pacing Threshold Amplitude: 0.5 V
Lead Channel Sensing Intrinsic Amplitude: 1 mV
Lead Channel Setting Pacing Amplitude: 2 V
MDC IDC LEAD IMPLANT DT: 20141119
MDC IDC LEAD LOCATION: 753860
MDC IDC MSMT BATTERY REMAINING LONGEVITY: 128 mo
MDC IDC MSMT LEADCHNL RA IMPEDANCE VALUE: 643 Ohm
MDC IDC MSMT LEADCHNL RA PACING THRESHOLD AMPLITUDE: 0.75 V
MDC IDC MSMT LEADCHNL RA PACING THRESHOLD PULSEWIDTH: 0.4 ms
MDC IDC MSMT LEADCHNL RV PACING THRESHOLD PULSEWIDTH: 0.4 ms
MDC IDC SET LEADCHNL RV PACING AMPLITUDE: 2.5 V
MDC IDC SET LEADCHNL RV PACING PULSEWIDTH: 0.4 ms
MDC IDC SET LEADCHNL RV SENSING SENSITIVITY: 5.6 mV
MDC IDC STAT BRADY AP VP PERCENT: 15 %
MDC IDC STAT BRADY AS VP PERCENT: 83 %
MDC IDC STAT BRADY AS VS PERCENT: 1 %

## 2016-08-12 ENCOUNTER — Encounter: Payer: Self-pay | Admitting: Nurse Practitioner

## 2016-08-18 ENCOUNTER — Encounter: Payer: Self-pay | Admitting: Nurse Practitioner

## 2016-08-18 NOTE — Progress Notes (Signed)
Electrophysiology Office Note Date: 08/19/2016  ID:  Shelly Ross, DOB February 22, 1925, MRN [id number]  PCP: No PCP Per Patient Electrophysiologist: Shelly Ross  CC: Pacemaker follow-up  Shelly Ross is a 83 y.o. female seen today for Shelly Ross.  She presents today for routine electrophysiology followup.  Since last being seen in our clinic, the patient reports doing reasonably well.  She is very dizzy this morning and does not feel well. She has not had fevers or chills.  She denies chest pain, palpitations, dyspnea, PND, orthopnea, nausea, vomiting, syncope, edema, weight gain, or early satiety.  Device History: MDT dual chamber PPM implanted 2014 for Mobtiz II   Past Medical History:  Diagnosis Date  . Adenomatous colon polyp 2003  . Degenerative disc disease    CERVICAL SPINE,W/RADICULOPATHY  . Dementia   . GERD (gastroesophageal reflux disease)   . Headache(784.0)    not migraines, but a lot of headaches  . Hyperlipidemia   . Hypertension   . Hypothyroidism   . Osteoporosis   . Premature ventricular contractions 2006  . Rib fractures   . Stricture and stenosis of esophagus 2007  . SVT (supraventricular tachycardia) ([place])    S/P ablation 2005   Past Surgical History:  Procedure Laterality Date  . ABDOMINAL HYSTERECTOMY     WITH OOPHORECTOMY,? BILATERAL FOR ENDOMETRIOSIS  . BLADDER TACKING    . BREAST ENHANCEMENT SURGERY    . CARDIAC ELECTROPHYSIOLOGY MAPPING AND ABLATION    . CATARACT EXTRACTION Bilateral   . COLONOSCOPY    . ENDOVENOUS ABLATION SAPHENOUS VEIN W/ LASER  07-13-2011   right greater saphenous vein, Shelly Mamie Nick  . ENDOVENOUS ABLATION SAPHENOUS VEIN W/ LASER  08/2011   L ; Shelly Kellie Simmering  . esophageal dilation      X 1  . G2 P2    . PERMANENT PACEMAKER INSERTION N/A 04/19/2013   MDT Adapta L iimplanted by Shelly Ross for mobitz II second degree AV block  . TOTAL KNEE ARTHROPLASTY Right 04/17/2013   Procedure: RIGHT TOTAL KNEE ARTHROPLASTY, CORTISONE  INJECTION LEFT KNEE;  Surgeon: Gearlean Alf, MD;  Location: [facility];  Service: Orthopedics;  Laterality: Right;    Current Outpatient Prescriptions  Medication Sig Dispense Refill  . atorvastatin (LIPITOR) 10 MG tablet Take 10 mg by mouth at bedtime.    . busPIRone (BUSPAR) 5 MG tablet Take 5 mg by mouth 2 (two) times daily.    . Cranberry 1000 MG CAPS Take 1,000 mg by mouth daily.    Marland Kitchen levothyroxine (SYNTHROID, LEVOTHROID) 75 MCG tablet Take 37.5-75 mcg by mouth daily before breakfast. Pt takes one-half tablet all days, but Wednesday -- she takes one whole tablet this day.  2  . mirtazapine (REMERON) 15 MG tablet Take 1 tablet by mouth daily as needed for sleep. 1  4  . pantoprazole (PROTONIX) 40 MG tablet Take 40 mg by mouth daily.    Marland Kitchen tolterodine (DETROL LA) 4 MG 24 hr capsule Take 4 mg by mouth daily.  0  . traMADol (ULTRAM) 50 MG tablet Take 50 mg by mouth 3 (three) times daily.   0   No current facility-administered medications for this visit.     Allergies:   Lansoprazole; Percocet [oxycodone-acetaminophen]; and Other   Social History: Social History   Social History  . Marital status: Widowed    Spouse name: N/A  . Number of children: 2  . Years of education: 12th   Occupational History  . Retired Retired  [employer]   Social History Main Topics  . Smoking status: Never Smoker  . Smokeless tobacco: Never Used  . Alcohol use No     Comment: wine, seldom   . Drug use: No  . Sexual activity: No   Other Topics Concern  . Not on file   Social History Narrative      Pt lives in assisted living.    Caffeine Use- Very little.    Family History: Family History  Problem Relation Age of Onset  . Stroke Mother 7  . Heart attack Father     in 55s  . Deep vein thrombosis Brother     AND PTE  . Diabetes Brother   . Cancer Paternal Aunt      INTESTINAL   . Alzheimer's disease Sister   . Alzheimer's disease Brother   .  Tuberculosis Brother   . Aneurysm Brother     cns  . Heart Problems    . COPD Sister     smoking  . Heart attack Sister 6     Review of Systems: All other systems reviewed and are otherwise negative except as noted above.   Physical Exam: VS:  BP (!) 74/48   Pulse 72   Ht 5\' 1"  (1.549 m)   Wt 121 lb (54.9 kg)   SpO2 98%   BMI 22.86 kg/m  , BMI Body mass index is 22.86 kg/m.  GEN- The patient is elderly and frail appearing, alert and oriented x 3 today.   HEENT: normocephalic, atraumatic; sclera clear, conjunctiva pink; hearing intact; oropharynx clear; neck supple  Lungs- Clear to ausculation bilaterally, normal work of breathing.  No wheezes, rales, rhonchi Heart- Regular rate and rhythm  GI- soft, non-tender, non-distended, bowel sounds present Extremities- no clubbing, cyanosis, or edema  MS- no significant deformity or atrophy Skin- warm and dry, no rash or lesion; PPM pocket well healed Psych- euthymic mood, full affect Neuro- strength and sensation are intact  PPM Interrogation- reviewed in detail today,  See PACEART report  EKG:  EKG is ordered today. The ekg ordered today shows AV pacing at 82bpm  Recent Labs: No results found for requested labs within last 8760 hours.   Wt Readings from Last 3 Encounters:  08/19/16 121 lb (54.9 kg)  01/26/16 120 lb (54.4 kg)  08/12/15 124 lb 3.2 oz (56.3 kg)     Other studies Reviewed: Additional studies/ records that were reviewed today include: Shelly Jackalyn [place] office notes  Assessment and Plan:  1.  Complete heart block Normal PPM function See Pace Art report No changes today Pt is pacemaker dependent today  2.  Hypotension The patient is very hypotensive in the office today despite eating breakfast and drinking more fluids in the office today She is not on anti-hypertensives at home. No recent fevers or chills, adequate po intake per CMA, no dark, tarry stools, or obvious bleeding. Discussed with Shelly Lovena Le, will  have patient evaluated in the ER for hypotension and fluid resuscitation    Current medicines are reviewed at length with the patient today.   The patient does not have concerns regarding her medicines.  The following changes were made today:  none  Labs/ tests ordered today include:  Orders Placed This Encounter  Procedures  . CUP PACEART [place]  . EKG 12-Lead     Disposition:   Follow up with [facility], Shelly Ross 1 year    Signed, Chanetta Marshall, NP 08/19/2016  11:49 AM  [facility] [address] [place] [address] [phone] (office) [phone] (fax)

## 2016-08-19 ENCOUNTER — Emergency Department (HOSPITAL_COMMUNITY)
Admission: EM | Admit: 2016-08-19 | Discharge: 2016-08-19 | Disposition: A | Discharge status: Home / Self Care | Payer: Medicare Other | Attending: Emergency Medicine | Admitting: Emergency Medicine

## 2016-08-19 ENCOUNTER — Encounter (HOSPITAL_COMMUNITY): Payer: Self-pay

## 2016-08-19 ENCOUNTER — Emergency Department (HOSPITAL_COMMUNITY): Payer: Medicare Other

## 2016-08-19 ENCOUNTER — Encounter: Payer: Self-pay | Admitting: Nurse Practitioner

## 2016-08-19 ENCOUNTER — Ambulatory Visit (INDEPENDENT_AMBULATORY_CARE_PROVIDER_SITE_OTHER): Payer: Medicare Other | Admitting: Nurse Practitioner

## 2016-08-19 ENCOUNTER — Encounter (INDEPENDENT_AMBULATORY_CARE_PROVIDER_SITE_OTHER): Payer: Self-pay

## 2016-08-19 VITALS — BP 74/48 | HR 72 | Ht 61.0 in | Wt 121.0 lb

## 2016-08-19 DIAGNOSIS — I441 Atrioventricular block, second degree: Secondary | ICD-10-CM

## 2016-08-19 DIAGNOSIS — Z79899 Other long term (current) drug therapy: Secondary | ICD-10-CM | POA: Insufficient documentation

## 2016-08-19 DIAGNOSIS — Z96651 Presence of right artificial knee joint: Secondary | ICD-10-CM | POA: Diagnosis not present

## 2016-08-19 DIAGNOSIS — I959 Hypotension, unspecified: Secondary | ICD-10-CM

## 2016-08-19 DIAGNOSIS — Z950 Presence of cardiac pacemaker: Secondary | ICD-10-CM | POA: Diagnosis not present

## 2016-08-19 DIAGNOSIS — E039 Hypothyroidism, unspecified: Secondary | ICD-10-CM | POA: Insufficient documentation

## 2016-08-19 LAB — CBC WITH DIFFERENTIAL/PLATELET
Basophils Absolute: 0 10*3/uL (ref 0.0–0.1)
Basophils Relative: 0 %
Eosinophils Absolute: 0.3 10*3/uL (ref 0.0–0.7)
Eosinophils Relative: 5 %
HEMATOCRIT: 45.6 % (ref 36.0–46.0)
Hemoglobin: 14.8 g/dL (ref 12.0–15.0)
LYMPHS PCT: 29 %
Lymphs Abs: 2 10*3/uL (ref 0.7–4.0)
MCH: 30.6 pg (ref 26.0–34.0)
MCHC: 32.5 g/dL (ref 30.0–36.0)
MCV: 94.2 fL (ref 78.0–100.0)
MONO ABS: 0.4 10*3/uL (ref 0.1–1.0)
MONOS PCT: 7 %
Neutro Abs: 4 10*3/uL (ref 1.7–7.7)
Neutrophils Relative %: 59 %
Platelets: 222 10*3/uL (ref 150–400)
RBC: 4.84 MIL/uL (ref 3.87–5.11)
RDW: 13.5 % (ref 11.5–15.5)
WBC: 6.8 10*3/uL (ref 4.0–10.5)

## 2016-08-19 LAB — BASIC METABOLIC PANEL
ANION GAP: 6 (ref 5–15)
BUN: 15 mg/dL (ref 6–20)
CALCIUM: 9.3 mg/dL (ref 8.9–10.3)
CO2: 29 mmol/L (ref 22–32)
Chloride: 104 mmol/L (ref 101–111)
Creatinine, Ser: 0.9 mg/dL (ref 0.44–1.00)
GFR calc Af Amer: >60 mL/min (ref >60)
GFR, EST NON AFRICAN AMERICAN: 55 mL/min — AB (ref >60)
Glucose, Bld: 102 mg/dL — ABNORMAL HIGH (ref 65–99)
Potassium: 3.5 mmol/L (ref 3.5–5.1)
Sodium: 139 mmol/L (ref 135–145)

## 2016-08-19 LAB — TSH: TSH: 4.202 u[IU]/mL (ref 0.350–4.500)

## 2016-08-19 LAB — CUP PACEART INCLINIC DEVICE CHECK
Date Time Interrogation Session: 20180321105322
Implantable Lead Implant Date: 20141119
Implantable Lead Implant Date: 20141119
Implantable Lead Model: 5092
Implantable Pulse Generator Implant Date: 20141119
MDC IDC LEAD LOCATION: 753859
MDC IDC LEAD LOCATION: 753860

## 2016-08-19 LAB — TROPONIN I: Troponin I: <0.03 ng/mL (ref <0.03)

## 2016-08-19 MED ORDER — SODIUM CHLORIDE 0.9 % IV BOLUS (SEPSIS)
500.0000 mL | Freq: Once | INTRAVENOUS | Status: AC
  Administered 2016-08-19: 500 mL via INTRAVENOUS

## 2016-08-19 NOTE — ED Triage Notes (Signed)
Patient sent from MD office to be evaluated for hypotension in office. Family reports BP was low in 60s-70s. On arrival BP systolic 741 and patient complains of several days of general fatigue and right ear pain, none on arrival. Family reports some confusion due to dementia

## 2016-08-19 NOTE — ED Notes (Signed)
Urine not gotten family had an appointment and she just had a urine last week

## 2016-08-19 NOTE — ED Provider Notes (Signed)
Blood pressure remained stable. Patient and patient's son are asking to be discharged. Understand UA is still pending. Do not want to wait on this test. States she had a negative UA last week at her primary care doctor's office. Will follow up with primary physician. Return cautions given.   Julianne Rice, MD 08/19/16 [phone]

## 2016-08-19 NOTE — ED Notes (Signed)
Got patient some water pat is resting family is at bedside

## 2016-08-19 NOTE — ED Notes (Signed)
Pt given lunch tray per Karen(RN)

## 2016-08-19 NOTE — ED Provider Notes (Signed)
[facility] DEPT Provider Note   CSN: [id number] Arrival date & time: 08/19/16  1157     History   Chief Complaint Chief Complaint  Patient presents with  . weakness/ low blood pressure   Level V caveat due to dementia. HPI Shelly Ross is a 83 y.o. female.  HPI Patient sent in from cardiology for hypotension. Reportedly has been weak. Blood pressures been 60s to 70s. Upon arrival blood pressure here is 110. Has had just general fatigue. Has a baseline confusion due to dementia. Has reportedly good oral intake. Has had thyroid problems but has been well controlled recently. No chest pain. No dysuria. No trouble breathing.   Past Medical History:  Diagnosis Date  . Adenomatous colon polyp 2003  . Degenerative disc disease    CERVICAL SPINE,W/RADICULOPATHY  . Dementia   . GERD (gastroesophageal reflux disease)   . Headache(784.0)    not migraines, but a lot of headaches  . Hyperlipidemia   . Hypertension   . Hypothyroidism   . Osteoporosis   . Premature ventricular contractions 2006  . Rib fractures   . Stricture and stenosis of esophagus 2007  . SVT (supraventricular tachycardia) ([place])    S/P ablation 2005    Patient Active Problem List   Diagnosis Date Noted  . UTI (urinary tract infection) 07/21/2015  . Sepsis secondary to UTI ([place])   . Second degree AV block   . HLD (hyperlipidemia)   . Acute UTI 07/20/2015  . Elevated lactic acid level 07/20/2015  . Pyelonephritis 07/20/2015  . Nausea and vomiting 07/20/2015  . Fall 05/28/2014  . Multiple rib fractures 05/24/2014  . Hyponatremia 04/21/2013  . Postoperative anemia due to acute blood loss 04/21/2013  . Hypokalemia 04/21/2013  . AV block, 2nd degree 04/18/2013  . OA (osteoarthritis) of knee 04/17/2013  . DJD (degenerative joint disease) of knee 02/15/2013  . Diarrhea 02/10/2013  . Hypercalcemia 01/17/2013  . Memory loss 09/09/2012  . Varicose veins of lower extremities with other complications  53/97/6734  . ANXIETY 07/23/2010  . DEGENERATIVE DISC DISEASE, LUMBAR SPINE 07/09/2009  . EPISTAXIS 07/09/2009  . OSTEOPENIA 02/08/2009  . PALPITATIONS, RECURRENT 10/22/2008  . Essential hypertension 01/12/2008  . PREMATURE VENTRICULAR CONTRACTIONS 01/12/2008  . DIZZINESS 01/12/2008  . Hypothyroidism 07/18/2007  . HYPERLIPIDEMIA 07/18/2007  . GERD 07/18/2007  . DEGENERATIVE DISC DISEASE, CERVICAL SPINE, W/RADICULOPATHY 07/18/2007    Past Surgical History:  Procedure Laterality Date  . ABDOMINAL HYSTERECTOMY     WITH OOPHORECTOMY,? BILATERAL FOR ENDOMETRIOSIS  . BLADDER TACKING    . BREAST ENHANCEMENT SURGERY    . CARDIAC ELECTROPHYSIOLOGY MAPPING AND ABLATION    . CATARACT EXTRACTION Bilateral   . COLONOSCOPY    . ENDOVENOUS ABLATION SAPHENOUS VEIN W/ LASER  07-13-2011   right greater saphenous vein, Dr Mamie Nick  . ENDOVENOUS ABLATION SAPHENOUS VEIN W/ LASER  08/2011   L ; Dr Kellie Simmering  . esophageal dilation      X 1  . G2 P2    . PERMANENT PACEMAKER INSERTION N/A 04/19/2013   MDT Adapta L iimplanted by Dr Rayann Heman for mobitz II second degree AV block  . TOTAL KNEE ARTHROPLASTY Right 04/17/2013   Procedure: RIGHT TOTAL KNEE ARTHROPLASTY, CORTISONE INJECTION LEFT KNEE;  Surgeon: Gearlean Alf, MD;  Location: [facility];  Service: Orthopedics;  Laterality: Right;    OB History    No data available       Home Medications    Prior to Admission medications  Medication Sig Start Date End Date Taking? Authorizing Provider  atorvastatin (LIPITOR) 10 MG tablet Take 10 mg by mouth at bedtime.    Historical Provider, MD  busPIRone (BUSPAR) 5 MG tablet Take 5 mg by mouth 2 (two) times daily.    Historical Provider, MD  Cranberry 1000 MG CAPS Take 1,000 mg by mouth daily.    Historical Provider, MD  levothyroxine (SYNTHROID, LEVOTHROID) 75 MCG tablet Take 37.5-75 mcg by mouth daily before breakfast. Pt takes one-half tablet all days, but Wednesday -- she takes one whole tablet this  day.    Historical Provider, MD  mirtazapine (REMERON) 15 MG tablet Take 1 tablet by mouth daily as needed for sleep. 1 08/06/16   Historical Provider, MD  pantoprazole (PROTONIX) 40 MG tablet Take 40 mg by mouth daily.    Historical Provider, MD  tolterodine (DETROL LA) 4 MG 24 hr capsule Take 4 mg by mouth daily.    Historical Provider, MD  traMADol (ULTRAM) 50 MG tablet Take 50 mg by mouth 3 (three) times daily.     Historical Provider, MD    Family History Family History  Problem Relation Age of Onset  . Stroke Mother 69  . Heart attack Father     in 46s  . Deep vein thrombosis Brother     AND PTE  . Diabetes Brother   . Cancer Paternal Aunt      INTESTINAL   . Alzheimer's disease Sister   . Alzheimer's disease Brother   . Tuberculosis Brother   . Aneurysm Brother     cns  . Heart Problems    . COPD Sister     smoking  . Heart attack Sister 66    Social History Social History  Substance Use Topics  . Smoking status: Never Smoker  . Smokeless tobacco: Never Used  . Alcohol use No     Comment: wine, seldom      Allergies   Lansoprazole; Percocet [oxycodone-acetaminophen]; and Other   Review of Systems Review of Systems  Unable to perform ROS: Dementia     Physical Exam Updated Vital Signs BP 104/60   Pulse 63   Temp 98.5 F (36.9 C) (Oral)   Resp 18   SpO2 93%   Physical Exam  Constitutional: She appears well-developed.  HENT:  Head: Atraumatic.  Eyes: Pupils are equal, round, and reactive to light.  Neck: Neck supple.  Cardiovascular: Normal rate.   Pulmonary/Chest: Effort normal.  Pacemaker to left chest wall  Abdominal: There is no tenderness.  Musculoskeletal: Normal range of motion. She exhibits no edema.  Neurological: She is alert.  Skin: Skin is warm. Capillary refill takes less than 2 seconds.  Psychiatric: She has a normal mood and affect.     ED Treatments / Results  Labs (all labs ordered are listed, but only abnormal results  are displayed) Labs Reviewed  BASIC METABOLIC PANEL - Abnormal; Notable for the following:       Result Value   Glucose, Bld 102 (*)    GFR calc non Af Amer 55 (*)    All other components within normal limits  TROPONIN I  CBC WITH DIFFERENTIAL/PLATELET  URINALYSIS, ROUTINE W REFLEX MICROSCOPIC  TSH    EKG  EKG Interpretation  Date/Time:  Wednesday August 19 2016 12:36:03 EDT Ventricular Rate:  65 PR Interval:    QRS Duration: 139 QT Interval:  476 QTC Calculation: 495 R Axis:   -80 Text Interpretation:  Sinus rhythm Left bundle  branch block No significant change since last tracing Confirmed by Alvino Chapel  MD, Kaushal Vannice [phone]) on 08/19/2016 12:42:33 PM       Radiology Dg Chest 2 View  Result Date: 08/19/2016 CLINICAL DATA:  Weakness, hypertension EXAM: CHEST  2 VIEW COMPARISON:  07/20/2015 FINDINGS: Left pacer remains in place, unchanged. Heart is upper limits normal in size. Lungs are clear. No effusions or acute bony abnormality. IMPRESSION: No active cardiopulmonary disease. Electronically Signed   By: Rolm Baptise M.D.   On: 08/19/2016 13:07    Procedures Procedures (including critical care time)  Medications Ordered in ED Medications  sodium chloride 0.9 % bolus 500 mL (not administered)     Initial Impression / Assessment and Plan / ED Course  I have reviewed the triage vital signs and the nursing notes.  Pertinent labs & imaging results that were available during my care of the patient were reviewed by me and considered in my medical decision making (see chart for details).     Patient with hypotension. Sent in from cardiology. Lab work so far reassuring but urinalysis and TSH pending. Blood pressures improved somewhat even before fluids. Likely will be able to discharge home. Care turned over to [facility].  Final Clinical Impressions(s) / ED Diagnoses   Final diagnoses:  Hypotension, unspecified hypotension type    New Prescriptions New Prescriptions    No medications on file     Davonna Belling, MD 08/19/16 1520

## 2016-08-19 NOTE — ED Notes (Signed)
Iv orhtostatic vitals

## 2016-08-19 NOTE — Patient Instructions (Addendum)
Medication Instructions:   Your physician recommends that you continue on your current medications as directed. Please refer to the Current Medication list given to you today.   If you need a refill on your cardiac medications before your next appointment, please call your pharmacy.  Labwork: NONE ORDERED  TODAY    Testing/Procedures: NONE ORDERED  TODAY    Follow-Up:  Your physician wants you to follow-up in: [place] will receive a reminder letter in the mail two months in advance. If you don't receive a letter, please call our office to schedule the follow-up appointment.  Remote monitoring is used to monitor your Pacemaker of ICD from home. This monitoring reduces the number of office visits required to check your device to one time per year. It allows [place] to keep an eye on the functioning of your device to ensure it is working properly. You are scheduled for a device check from home on . 11-19-2016..You may send your transmission at any time that day. If you have a wireless device, the transmission will be sent automatically. After your physician reviews your transmission, you will receive a postcard with your next transmission date.     Any Other Special Instructions Will Be Listed Below (If Applicable).

## 2016-11-19 ENCOUNTER — Ambulatory Visit (INDEPENDENT_AMBULATORY_CARE_PROVIDER_SITE_OTHER): Payer: Medicare Other | Admitting: *Deleted

## 2016-11-19 DIAGNOSIS — I441 Atrioventricular block, second degree: Secondary | ICD-10-CM | POA: Diagnosis not present

## 2016-11-19 NOTE — Progress Notes (Signed)
Remote pacemaker transmission.   

## 2016-11-20 ENCOUNTER — Encounter: Payer: Self-pay | Admitting: Cardiology

## 2016-12-02 LAB — CUP PACEART REMOTE DEVICE CHECK
Battery Impedance: 203 Ohm
Battery Voltage: 2.79 V
Brady Statistic AP VP Percent: 14 %
Brady Statistic AP VS Percent: 0 %
Brady Statistic AS VP Percent: 85 %
Implantable Lead Implant Date: 20141119
Implantable Lead Location: 753860
Implantable Lead Model: 5092
Implantable Lead Model: 5592
Implantable Pulse Generator Implant Date: 20141119
Lead Channel Impedance Value: 644 Ohm
Lead Channel Pacing Threshold Amplitude: 0.75 V
Lead Channel Pacing Threshold Pulse Width: 0.4 ms
Lead Channel Setting Pacing Amplitude: 2.5 V
Lead Channel Setting Pacing Pulse Width: 0.4 ms
MDC IDC LEAD IMPLANT DT: 20141119
MDC IDC LEAD LOCATION: 753859
MDC IDC MSMT BATTERY REMAINING LONGEVITY: 119 mo
MDC IDC MSMT LEADCHNL RV IMPEDANCE VALUE: 781 Ohm
MDC IDC MSMT LEADCHNL RV PACING THRESHOLD AMPLITUDE: 0.375 V
MDC IDC MSMT LEADCHNL RV PACING THRESHOLD PULSEWIDTH: 0.4 ms
MDC IDC SESS DTM: 20180621123319
MDC IDC SET LEADCHNL RA PACING AMPLITUDE: 2 V
MDC IDC SET LEADCHNL RV SENSING SENSITIVITY: 5.6 mV
MDC IDC STAT BRADY AS VS PERCENT: 1 %

## 2016-12-04 ENCOUNTER — Encounter: Payer: Self-pay | Admitting: Cardiology

## 2017-02-18 ENCOUNTER — Ambulatory Visit (INDEPENDENT_AMBULATORY_CARE_PROVIDER_SITE_OTHER): Payer: Medicare Other | Admitting: *Deleted

## 2017-02-18 ENCOUNTER — Telehealth: Payer: Self-pay | Admitting: Cardiology

## 2017-02-18 DIAGNOSIS — I441 Atrioventricular block, second degree: Secondary | ICD-10-CM | POA: Diagnosis not present

## 2017-02-18 NOTE — Telephone Encounter (Signed)
LMOVM reminding pt to send remote transmission.   

## 2017-02-19 ENCOUNTER — Encounter: Payer: Self-pay | Admitting: Cardiology

## 2017-02-19 NOTE — Progress Notes (Signed)
Remote pacemaker transmission.   

## 2017-02-22 LAB — CUP PACEART REMOTE DEVICE CHECK
Brady Statistic AP VP Percent: 12 %
Brady Statistic AS VS Percent: 1 %
Date Time Interrogation Session: 20180920162700
Implantable Lead Implant Date: 20141119
Implantable Lead Implant Date: 20141119
Implantable Lead Location: 753859
Lead Channel Impedance Value: 761 Ohm
Lead Channel Pacing Threshold Amplitude: 0.5 V
Lead Channel Pacing Threshold Pulse Width: 0.4 ms
Lead Channel Setting Pacing Amplitude: 2 V
Lead Channel Setting Sensing Sensitivity: 5.6 mV
MDC IDC LEAD LOCATION: 753860
MDC IDC MSMT BATTERY IMPEDANCE: 226 Ohm
MDC IDC MSMT BATTERY REMAINING LONGEVITY: 115 mo
MDC IDC MSMT BATTERY VOLTAGE: 2.79 V
MDC IDC MSMT LEADCHNL RA IMPEDANCE VALUE: 632 Ohm
MDC IDC MSMT LEADCHNL RA PACING THRESHOLD AMPLITUDE: 0.625 V
MDC IDC MSMT LEADCHNL RA PACING THRESHOLD PULSEWIDTH: 0.4 ms
MDC IDC PG IMPLANT DT: 20141119
MDC IDC SET LEADCHNL RV PACING AMPLITUDE: 2.5 V
MDC IDC SET LEADCHNL RV PACING PULSEWIDTH: 0.4 ms
MDC IDC STAT BRADY AP VS PERCENT: 0 %
MDC IDC STAT BRADY AS VP PERCENT: 86 %

## 2017-04-14 ENCOUNTER — Ambulatory Visit: Payer: Medicare Other | Admitting: Podiatry

## 2017-04-14 DIAGNOSIS — I872 Venous insufficiency (chronic) (peripheral): Secondary | ICD-10-CM | POA: Diagnosis not present

## 2017-04-14 DIAGNOSIS — B351 Tinea unguium: Secondary | ICD-10-CM | POA: Diagnosis not present

## 2017-04-14 DIAGNOSIS — L603 Nail dystrophy: Secondary | ICD-10-CM | POA: Diagnosis not present

## 2017-04-14 DIAGNOSIS — M79676 Pain in unspecified toe(s): Secondary | ICD-10-CM | POA: Diagnosis not present

## 2017-04-14 NOTE — Progress Notes (Signed)
   Subjective: Patient presents today for possible treatment and evaluation of fungus to the right great toenail that has been present for several weeks. She reports thickening and discoloration of the nail. She denies any pain. She has not had any previous treatment for the symptoms. She was referred here by the nursing home in which she lives. Patient presents today for further treatment and evaluation.   Past Medical History:  Diagnosis Date  . Adenomatous colon polyp 2003  . Degenerative disc disease    CERVICAL SPINE,W/RADICULOPATHY  . Dementia   . GERD (gastroesophageal reflux disease)   . Headache(784.0)    not migraines, but a lot of headaches  . Hyperlipidemia   . Hypertension   . Hypothyroidism   . Osteoporosis   . Premature ventricular contractions 2006  . Rib fractures   . Stricture and stenosis of esophagus 2007  . SVT (supraventricular tachycardia) ([place])    S/P ablation 2005    Objective: Physical Exam General: The patient is alert and oriented x3 in no acute distress.  Dermatology: Hyperkeratotic, discolored, thickened, onychodystrophy of the right great toenail. Skin is warm, dry and supple bilateral lower extremities. Negative for open lesions or macerations.  Vascular: Varicosities noted to bilateral lower extremities. Palpable pedal pulses bilaterally. No edema or erythema noted. Capillary refill within normal limits.  Neurological: Epicritic and protective threshold grossly intact bilaterally.   Musculoskeletal Exam: Range of motion within normal limits to all pedal and ankle joints bilateral. Muscle strength 5/5 in all groups bilateral.   Assessment: #1 onychomycosis right great toenail #2 venous insufficiency bilateral lower extremities  Plan of Care:  #1 Patient was evaluated. #2 Mechanical debridement of right great toenail performed using a nail nipper. Filed with dremel without incident.  #3 Continue wearing TED hose.  #4 Recommended good shoe  gear. #5 Return to clinic when necessary.   Edrick Kins, DPM [facility]  Dr. Edrick Kins, [place]                                        [place], [address] [address]                Office [phone]  Fax [phone]

## 2017-05-07 ENCOUNTER — Other Ambulatory Visit: Payer: Self-pay

## 2017-05-07 ENCOUNTER — Encounter (HOSPITAL_COMMUNITY): Payer: Self-pay | Admitting: Emergency Medicine

## 2017-05-07 ENCOUNTER — Observation Stay (HOSPITAL_COMMUNITY)
Admission: EM | Admit: 2017-05-07 | Discharge: 2017-05-08 | Disposition: A | Discharge status: Skilled Nursing Facility | Payer: Medicare Other | Attending: Internal Medicine | Admitting: Internal Medicine

## 2017-05-07 ENCOUNTER — Emergency Department (HOSPITAL_COMMUNITY): Payer: Medicare Other

## 2017-05-07 DIAGNOSIS — D649 Anemia, unspecified: Secondary | ICD-10-CM | POA: Diagnosis not present

## 2017-05-07 DIAGNOSIS — Z950 Presence of cardiac pacemaker: Secondary | ICD-10-CM | POA: Diagnosis not present

## 2017-05-07 DIAGNOSIS — E876 Hypokalemia: Secondary | ICD-10-CM | POA: Diagnosis not present

## 2017-05-07 DIAGNOSIS — F419 Anxiety disorder, unspecified: Secondary | ICD-10-CM | POA: Diagnosis not present

## 2017-05-07 DIAGNOSIS — Z885 Allergy status to narcotic agent status: Secondary | ICD-10-CM | POA: Insufficient documentation

## 2017-05-07 DIAGNOSIS — Z79899 Other long term (current) drug therapy: Secondary | ICD-10-CM | POA: Insufficient documentation

## 2017-05-07 DIAGNOSIS — M419 Scoliosis, unspecified: Secondary | ICD-10-CM | POA: Diagnosis not present

## 2017-05-07 DIAGNOSIS — K449 Diaphragmatic hernia without obstruction or gangrene: Secondary | ICD-10-CM | POA: Insufficient documentation

## 2017-05-07 DIAGNOSIS — E039 Hypothyroidism, unspecified: Secondary | ICD-10-CM | POA: Diagnosis not present

## 2017-05-07 DIAGNOSIS — E871 Hypo-osmolality and hyponatremia: Secondary | ICD-10-CM | POA: Diagnosis not present

## 2017-05-07 DIAGNOSIS — K920 Hematemesis: Principal | ICD-10-CM | POA: Diagnosis present

## 2017-05-07 DIAGNOSIS — K5900 Constipation, unspecified: Secondary | ICD-10-CM | POA: Insufficient documentation

## 2017-05-07 DIAGNOSIS — I10 Essential (primary) hypertension: Secondary | ICD-10-CM | POA: Diagnosis present

## 2017-05-07 DIAGNOSIS — K219 Gastro-esophageal reflux disease without esophagitis: Secondary | ICD-10-CM | POA: Insufficient documentation

## 2017-05-07 DIAGNOSIS — K222 Esophageal obstruction: Secondary | ICD-10-CM | POA: Insufficient documentation

## 2017-05-07 DIAGNOSIS — R1314 Dysphagia, pharyngoesophageal phase: Secondary | ICD-10-CM | POA: Diagnosis not present

## 2017-05-07 DIAGNOSIS — I471 Supraventricular tachycardia: Secondary | ICD-10-CM | POA: Insufficient documentation

## 2017-05-07 DIAGNOSIS — Z66 Do not resuscitate: Secondary | ICD-10-CM | POA: Insufficient documentation

## 2017-05-07 DIAGNOSIS — I700 Atherosclerosis of aorta: Secondary | ICD-10-CM | POA: Diagnosis not present

## 2017-05-07 DIAGNOSIS — E785 Hyperlipidemia, unspecified: Secondary | ICD-10-CM | POA: Diagnosis not present

## 2017-05-07 DIAGNOSIS — F0390 Unspecified dementia without behavioral disturbance: Secondary | ICD-10-CM | POA: Diagnosis not present

## 2017-05-07 LAB — CBC
HCT: 34.8 % — ABNORMAL LOW (ref 36.0–46.0)
HEMOGLOBIN: 11 g/dL — AB (ref 12.0–15.0)
MCH: 28.4 pg (ref 26.0–34.0)
MCHC: 31.6 g/dL (ref 30.0–36.0)
MCV: 89.9 fL (ref 78.0–100.0)
PLATELETS: 340 10*3/uL (ref 150–400)
RBC: 3.87 MIL/uL (ref 3.87–5.11)
RDW: 12.7 % (ref 11.5–15.5)
WBC: 6.6 10*3/uL (ref 4.0–10.5)

## 2017-05-07 LAB — COMPREHENSIVE METABOLIC PANEL
ALBUMIN: 3.6 g/dL (ref 3.5–5.0)
ALK PHOS: 48 U/L (ref 38–126)
ALT: 14 U/L (ref 14–54)
ANION GAP: 5 (ref 5–15)
AST: 24 U/L (ref 15–41)
BILIRUBIN TOTAL: 0.6 mg/dL (ref 0.3–1.2)
BUN: 18 mg/dL (ref 6–20)
CALCIUM: 9.2 mg/dL (ref 8.9–10.3)
CO2: 28 mmol/L (ref 22–32)
Chloride: 105 mmol/L (ref 101–111)
Creatinine, Ser: 0.69 mg/dL (ref 0.44–1.00)
GFR calc non Af Amer: >60 mL/min (ref >60)
GLUCOSE: 95 mg/dL (ref 65–99)
Potassium: 3.4 mmol/L — ABNORMAL LOW (ref 3.5–5.1)
Sodium: 138 mmol/L (ref 135–145)
TOTAL PROTEIN: 6.3 g/dL — AB (ref 6.5–8.1)

## 2017-05-07 LAB — URINALYSIS, ROUTINE W REFLEX MICROSCOPIC
BILIRUBIN URINE: NEGATIVE
Glucose, UA: NEGATIVE mg/dL
Hgb urine dipstick: NEGATIVE
KETONES UR: NEGATIVE mg/dL
Leukocytes, UA: NEGATIVE
NITRITE: NEGATIVE
PROTEIN: NEGATIVE mg/dL
SPECIFIC GRAVITY, URINE: 1.008 (ref 1.005–1.030)
pH: 7 (ref 5.0–8.0)

## 2017-05-07 LAB — LIPASE, BLOOD: Lipase: 26 U/L (ref 11–51)

## 2017-05-07 LAB — TYPE AND SCREEN
ABO/RH(D): O POS
Antibody Screen: NEGATIVE

## 2017-05-07 LAB — I-STAT TROPONIN, ED: TROPONIN I, POC: 0 ng/mL (ref 0.00–0.08)

## 2017-05-07 LAB — POC OCCULT BLOOD, ED: FECAL OCCULT BLD: NEGATIVE

## 2017-05-07 LAB — MRSA PCR SCREENING: MRSA by PCR: NEGATIVE

## 2017-05-07 MED ORDER — FAMOTIDINE IN NACL 20-0.9 MG/50ML-% IV SOLN
20.0000 mg | Freq: Once | INTRAVENOUS | Status: AC
  Administered 2017-05-07: 20 mg via INTRAVENOUS
  Filled 2017-05-07: qty 50

## 2017-05-07 MED ORDER — POLYVINYL ALCOHOL 1.4 % OP SOLN: 1.0000 [drp] | OPHTHALMIC | Status: DC | PRN

## 2017-05-07 MED ORDER — LEVOTHYROXINE SODIUM 75 MCG PO TABS: 75.0000 ug | ORAL_TABLET | ORAL | Status: DC

## 2017-05-07 MED ORDER — SODIUM CHLORIDE 0.9 % IV BOLUS (SEPSIS)
500.0000 mL | Freq: Once | INTRAVENOUS | Status: AC
  Administered 2017-05-07: 500 mL via INTRAVENOUS

## 2017-05-07 MED ORDER — PANTOPRAZOLE SODIUM 40 MG IV SOLR
40.0000 mg | Freq: Once | INTRAVENOUS | Status: AC
  Administered 2017-05-07: 40 mg via INTRAVENOUS
  Filled 2017-05-07: qty 40

## 2017-05-07 MED ORDER — PANTOPRAZOLE SODIUM 40 MG PO TBEC
40.0000 mg | DELAYED_RELEASE_TABLET | Freq: Every day | ORAL | Status: DC
  Administered 2017-05-08: 40 mg via ORAL
  Filled 2017-05-07: qty 1

## 2017-05-07 MED ORDER — BUSPIRONE HCL 5 MG PO TABS
15.0000 mg | ORAL_TABLET | Freq: Two times a day (BID) | ORAL | Status: DC
  Administered 2017-05-07 – 2017-05-08 (×2): 15 mg via ORAL
  Filled 2017-05-07 (×2): qty 3

## 2017-05-07 MED ORDER — LEVOTHYROXINE SODIUM 75 MCG PO TABS
37.5000 ug | ORAL_TABLET | ORAL | Status: DC
  Administered 2017-05-08: 37.5 ug via ORAL
  Filled 2017-05-07: qty 1

## 2017-05-07 MED ORDER — FESOTERODINE FUMARATE ER 4 MG PO TB24
4.0000 mg | ORAL_TABLET | Freq: Every day | ORAL | Status: DC
  Administered 2017-05-08: 4 mg via ORAL
  Filled 2017-05-07: qty 1

## 2017-05-07 MED ORDER — ENOXAPARIN SODIUM 40 MG/0.4ML ~~LOC~~ SOLN
40.0000 mg | SUBCUTANEOUS | Status: DC
  Administered 2017-05-07: 40 mg via SUBCUTANEOUS
  Filled 2017-05-07: qty 0.4

## 2017-05-07 MED ORDER — HYPROMELLOSE (GONIOSCOPIC) 2.5 % OP SOLN: 1.0000 [drp] | Freq: Every day | OPHTHALMIC | Status: DC | PRN

## 2017-05-07 NOTE — ED Triage Notes (Signed)
Pt. Brought in via [facility] from [facility]. Nursing home staff found dried coffee like sputum about 2 days ago. Tested positive for blood. Upper abdominal pain but not on palpation. No hx. Of high bp.Pt. bp via EMS was 168/102, CBG-134, P-76. Pt. Is not taking any blood thinners. Pts. Pain is 2/10.

## 2017-05-07 NOTE — ED Notes (Signed)
Bed: XN23 Expected date:  Expected time:  Means of arrival:  Comments: 83 yo Abd pain

## 2017-05-07 NOTE — ED Notes (Signed)
Assigned 1407 @ 14:55 call report @ 15:15

## 2017-05-07 NOTE — ED Provider Notes (Signed)
[facility] DEPT Provider Note   CSN: [id number] Arrival date & time: 05/07/17  1145     History   Chief Complaint Chief Complaint  Patient presents with  . Abdominal Pain    HPI Shelly Ross is a 83 y.o. female history of dementia, reflux, esophageal stricture, hypertension, hyperlipidemia presenting with possible coffee-ground emesis.  Patient is from a facility and apparently has been having some frequent emesis for the last several days.  Patient unable to tell me any details and just told me that she has not been eating much and has been producing some blood-tinged sputum.  Patient denies any melena.  The nursing home apparently did a guaiac of the vomit and was positive so patient was sent here for GI evaluation.  Of note, patient was seen by [facility] previously and had endoscopy and the colonoscopy that showed some esophageal strictures.  Patient is already on Protonix 40 mg daily.  The history is provided by the patient, the EMS personnel and a relative.   Level V caveat- dementia   Past Medical History:  Diagnosis Date  . Adenomatous colon polyp 2003  . Degenerative disc disease    CERVICAL SPINE,W/RADICULOPATHY  . Dementia   . GERD (gastroesophageal reflux disease)   . Headache(784.0)    not migraines, but a lot of headaches  . Hyperlipidemia   . Hypertension   . Hypothyroidism   . Osteoporosis   . Premature ventricular contractions 2006  . Rib fractures   . Stricture and stenosis of esophagus 2007  . SVT (supraventricular tachycardia) ([place])    S/P ablation 2005    Patient Active Problem List   Diagnosis Date Noted  . UTI (urinary tract infection) 07/21/2015  . Sepsis secondary to UTI ([place])   . Second degree AV block   . HLD (hyperlipidemia)   . Acute UTI 07/20/2015  . Elevated lactic acid level 07/20/2015  . Pyelonephritis 07/20/2015  . Nausea and vomiting 07/20/2015  . Fall 05/28/2014  . Multiple rib fractures  05/24/2014  . Hyponatremia 04/21/2013  . Postoperative anemia due to acute blood loss 04/21/2013  . Hypokalemia 04/21/2013  . AV block, 2nd degree 04/18/2013  . OA (osteoarthritis) of knee 04/17/2013  . DJD (degenerative joint disease) of knee 02/15/2013  . Diarrhea 02/10/2013  . Hypercalcemia 01/17/2013  . Memory loss 09/09/2012  . Varicose veins of lower extremities with other complications 31/49/7026  . ANXIETY 07/23/2010  . DEGENERATIVE DISC DISEASE, LUMBAR SPINE 07/09/2009  . EPISTAXIS 07/09/2009  . OSTEOPENIA 02/08/2009  . PALPITATIONS, RECURRENT 10/22/2008  . Essential hypertension 01/12/2008  . PREMATURE VENTRICULAR CONTRACTIONS 01/12/2008  . DIZZINESS 01/12/2008  . Hypothyroidism 07/18/2007  . HYPERLIPIDEMIA 07/18/2007  . GERD 07/18/2007  . DEGENERATIVE DISC DISEASE, CERVICAL SPINE, W/RADICULOPATHY 07/18/2007    Past Surgical History:  Procedure Laterality Date  . ABDOMINAL HYSTERECTOMY     WITH OOPHORECTOMY,? BILATERAL FOR ENDOMETRIOSIS  . BLADDER TACKING    . BREAST ENHANCEMENT SURGERY    . CARDIAC ELECTROPHYSIOLOGY MAPPING AND ABLATION    . CATARACT EXTRACTION Bilateral   . COLONOSCOPY    . ENDOVENOUS ABLATION SAPHENOUS VEIN W/ LASER  07-13-2011   right greater saphenous vein, Dr Mamie Nick  . ENDOVENOUS ABLATION SAPHENOUS VEIN W/ LASER  08/2011   L ; Dr Kellie Simmering  . esophageal dilation      X 1  . G2 P2    . PERMANENT PACEMAKER INSERTION N/A 04/19/2013   MDT Adapta L iimplanted by  Dr Rayann Heman for mobitz II second degree AV block  . TOTAL KNEE ARTHROPLASTY Right 04/17/2013   Procedure: RIGHT TOTAL KNEE ARTHROPLASTY, CORTISONE INJECTION LEFT KNEE;  Surgeon: Gearlean Alf, MD;  Location: [facility];  Service: Orthopedics;  Laterality: Right;    OB History    No data available       Home Medications    Prior to Admission medications   Medication Sig Start Date End Date Taking? Authorizing Provider  alum & mag hydroxide-simeth ([place]) 200-200-20 MG/5ML  suspension Take 30 mLs by mouth every 6 (six) hours as needed for indigestion or heartburn.   Yes [provider]  busPIRone (BUSPAR) 10 MG tablet Take 15 mg by mouth 2 (two) times daily.   Yes [provider]  Cranberry 450 MG TABS Take 1 tablet by mouth daily with breakfast.   Yes [provider]  diphenhydrAMINE (BENADRYL) 25 MG tablet Take 12.5 mg by mouth at bedtime as needed for sleep.   Yes [provider]  hydroxypropyl methylcellulose / hypromellose (ISOPTO TEARS / GONIOVISC) 2.5 % ophthalmic solution Place 1 drop into both eyes daily as needed for dry eyes.   Yes [provider]  levothyroxine (SYNTHROID, LEVOTHROID) 75 MCG tablet Take 37.5-75 mcg by mouth daily before breakfast. Takes 37.5 mcg every day except 75 mcg on Wednesday   Yes [provider]  pantoprazole (PROTONIX) 40 MG tablet Take 40 mg by mouth daily with breakfast.    Yes [provider]  tolterodine (DETROL LA) 4 MG 24 hr capsule Take 4 mg by mouth daily with breakfast.    Yes [provider]  traMADol (ULTRAM) 50 MG tablet Take 50 mg by mouth 3 (three) times daily.    Yes [provider]    Family History Family History  Problem Relation Age of Onset  . Stroke Mother 25  . Heart attack Father        in 37s  . Deep vein thrombosis Brother        AND PTE  . Diabetes Brother   . Cancer Paternal Aunt         INTESTINAL   . Alzheimer's disease Sister   . Alzheimer's disease Brother   . Tuberculosis Brother   . Aneurysm Brother        cns  . Heart Problems Unknown   . COPD Sister        smoking  . Heart attack Sister 66    Social History Social History   Tobacco Use  . Smoking status: Never Smoker  . Smokeless tobacco: Never Used  Substance Use Topics  . Alcohol use: No    Comment: wine, seldom   . Drug use: No     Allergies   Lansoprazole; Percocet [oxycodone-acetaminophen]; Aricept [donepezil hcl]; Exelon  [rivastigmine tartrate]; and Namenda [memantine]   Review of Systems Review of Systems  Gastrointestinal: Positive for vomiting.  All other systems reviewed and are negative.    Physical Exam Updated Vital Signs BP (!) 162/81   Pulse 66   Temp 97.9 F (36.6 C) (Oral)   Resp 19   SpO2 97%   Physical Exam  Constitutional:  Chronically ill, demented   HENT:  Head: Normocephalic.  MM dry   Eyes: EOM are normal. Pupils are equal, round, and reactive to light.  Cardiovascular: Normal rate.  Pulmonary/Chest: Effort normal and breath sounds normal.  Abdominal: Normal appearance and bowel sounds are normal.  Genitourinary: Rectum normal. Rectal exam shows  guaiac negative stool.  Neurological: She is alert.  Demented, moving all extremities   Skin: Skin is warm. Capillary refill takes less than 2 seconds.  Psychiatric: She has a normal mood and affect.  Nursing note and vitals reviewed.    ED Treatments / Results  Labs (all labs ordered are listed, but only abnormal results are displayed) Labs Reviewed  COMPREHENSIVE METABOLIC PANEL - Abnormal; Notable for the following components:      Result Value   Potassium 3.4 (*)    Total Protein 6.3 (*)    All other components within normal limits  CBC - Abnormal; Notable for the following components:   Hemoglobin 11.0 (*)    HCT 34.8 (*)    All other components within normal limits  LIPASE, BLOOD  URINALYSIS, ROUTINE W REFLEX MICROSCOPIC  POC OCCULT BLOOD, ED  I-STAT TROPONIN, ED  TYPE AND SCREEN    EKG  EKG Interpretation  Date/Time:  Friday May 07 2017 13:11:57 EST Ventricular Rate:  64 PR Interval:    QRS Duration: 152 QT Interval:  456 QTC Calculation: 471 R Axis:   -87 Text Interpretation:  Atrial-sensed ventricular-paced rhythm No further analysis attempted due to paced rhythm No significant change since last tracing Confirmed by Wandra Arthurs [phone]) on 05/07/2017 1:19:55 PM       Radiology No results  found.  Procedures Procedures (including critical care time)  Medications Ordered in ED Medications  sodium chloride 0.9 % bolus 500 mL (500 mLs Intravenous New Bag/Given 05/07/17 1326)  famotidine (PEPCID) IVPB 20 mg premix (0 mg Intravenous Stopped 05/07/17 1420)  pantoprazole (PROTONIX) injection 40 mg (40 mg Intravenous Given 05/07/17 1337)     Initial Impression / Assessment and Plan / ED Course  I have reviewed the triage vital signs and the nursing notes.  Pertinent labs & imaging results that were available during my care of the patient were reviewed by me and considered in my medical decision making (see chart for details).    Alaiah Lundy is a 83 y.o. female here with possible coffee ground emesis. Already on protonix 40 mg daily. She is demented and unable to give much history. Will get cbc, guiac.   2:31 PM CBC showed Hg 11. Baseline around 13-14. No active bleeding. occ negative. She had previous endoscopy that showed esophageal strictures. I called Dr. Ardis Hughs from GI. He states that we should increase protonix to 40 mg BID and observe for serial CBCs. If she has more vomiting or Hg dropped again, then consult  GI to see patient. If she remains stable during observation period, she can return to nursing home.   Final Clinical Impressions(s) / ED Diagnoses   Final diagnoses:  None    ED Discharge Orders    None       Drenda Freeze, MD 05/07/17 1433

## 2017-05-07 NOTE — H&P (Signed)
History and Physical    Shelly Ross BJS:[id number] DOB: 02-14-1925 DOA: 05/07/2017  PCP: [name], MD  Patient coming from: SNF.  Chief Complaint: Coffee ground emesis.   HPI: Shelly Ross is a 83 y.o. female with medical history significant of hypertension, hyperlipidemia, dementia, esophageal stricture , comes in for coffee ground emesis. Pt is confused and no reliable history available. As per the family at bedside, pt has been spitting up food since 3 weeks. No complaints of nausea, vomiting or abdominal pain. No complaints of fever or chills. No hematemesis or hematochezia.  No headache or h/o falls. On arrival to ED she underwent abd film showing moderate stool burden. Labs show low potassium.  She was referred to medical service for admission for observation for coffee ground emesis.  EDP discussed with Dr Ardis Hughs with [facility] , recommended watching her for blood loss and worsening coffee ground emesis.   Review of Systems: ROS couldn't be obtained as she is confused.  Past Medical History:  Diagnosis Date  . Adenomatous colon polyp 2003  . Degenerative disc disease    CERVICAL SPINE,W/RADICULOPATHY  . Dementia   . GERD (gastroesophageal reflux disease)   . Headache(784.0)    not migraines, but a lot of headaches  . Hyperlipidemia   . Hypertension   . Hypothyroidism   . Osteoporosis   . Premature ventricular contractions 2006  . Rib fractures   . Stricture and stenosis of esophagus 2007  . SVT (supraventricular tachycardia) ([place])    S/P ablation 2005    Past Surgical History:  Procedure Laterality Date  . ABDOMINAL HYSTERECTOMY     WITH OOPHORECTOMY,? BILATERAL FOR ENDOMETRIOSIS  . BLADDER TACKING    . BREAST ENHANCEMENT SURGERY    . CARDIAC ELECTROPHYSIOLOGY MAPPING AND ABLATION    . CATARACT EXTRACTION Bilateral   . COLONOSCOPY    . ENDOVENOUS ABLATION SAPHENOUS VEIN W/ LASER  07-13-2011   right greater saphenous vein, Dr Mamie Nick  . ENDOVENOUS  ABLATION SAPHENOUS VEIN W/ LASER  08/2011   L ; Dr Kellie Simmering  . esophageal dilation      X 1  . G2 P2    . PERMANENT PACEMAKER INSERTION N/A 04/19/2013   MDT Adapta L iimplanted by Dr Rayann Heman for mobitz II second degree AV block  . TOTAL KNEE ARTHROPLASTY Right 04/17/2013   Procedure: RIGHT TOTAL KNEE ARTHROPLASTY, CORTISONE INJECTION LEFT KNEE;  Surgeon: Gearlean Alf, MD;  Location: [facility];  Service: Orthopedics;  Laterality: Right;     reports that  has never smoked. she has never used smokeless tobacco. She reports that she does not drink alcohol or use drugs.  Allergies  Allergen Reactions  . Lansoprazole Nausea Only and Other (See Comments)    Reaction:  Headaches   . Percocet [Oxycodone-Acetaminophen] Nausea And Vomiting  . Aricept [Donepezil Hcl]     Unknown reaction per MAR   . Exelon [Rivastigmine Tartrate]     Unknown reaction per MAR   . Namenda [Memantine]     [facility] reaction [facility]     Family History  Problem Relation Age of Onset  . Stroke Mother 26  . Heart attack Father        in 78s  . Deep vein thrombosis Brother        AND PTE  . Diabetes Brother   . Cancer Paternal Aunt         INTESTINAL   . Alzheimer's disease Sister   . Alzheimer's  disease Brother   . Tuberculosis Brother   . Aneurysm Brother        cns  . Heart Problems Unknown   . COPD Sister        smoking  . Heart attack Sister 3   Family history reviewed with her family at bedside.    Prior to Admission medications   Medication Sig Start Date End Date Taking? Authorizing Provider  alum & mag hydroxide-simeth ([place]) 200-200-20 MG/5ML suspension Take 30 mLs by mouth every 6 (six) hours as needed for indigestion or heartburn.   Yes [provider]  busPIRone (BUSPAR) 10 MG tablet Take 15 mg by mouth 2 (two) times daily.   Yes [provider]  Cranberry 450 MG TABS Take 1 tablet by mouth daily with breakfast.   Yes [provider]  diphenhydrAMINE (BENADRYL)  25 MG tablet Take 12.5 mg by mouth at bedtime as needed for sleep.   Yes [provider]  hydroxypropyl methylcellulose / hypromellose (ISOPTO TEARS / GONIOVISC) 2.5 % ophthalmic solution Place 1 drop into both eyes daily as needed for dry eyes.   Yes [provider]  levothyroxine (SYNTHROID, LEVOTHROID) 75 MCG tablet Take 37.5-75 mcg by mouth daily before breakfast. Takes 37.5 mcg every day except 75 mcg on Wednesday   Yes [provider]  pantoprazole (PROTONIX) 40 MG tablet Take 40 mg by mouth daily with breakfast.    Yes [provider]  tolterodine (DETROL LA) 4 MG 24 hr capsule Take 4 mg by mouth daily with breakfast.    Yes [provider]  traMADol (ULTRAM) 50 MG tablet Take 50 mg by mouth 3 (three) times daily.    Yes [provider]    Physical Exam: Vitals:   05/07/17 1420 05/07/17 1438 05/07/17 1541 05/07/17 1542  BP: (!) 185/87 (!) 186/94 (!) 171/89   Pulse: 71   72  Resp: 15 12  12   Temp:      TempSrc:      SpO2: 99%   100%    Constitutional: NAD, calm, comfortable Vitals:   05/07/17 1420 05/07/17 1438 05/07/17 1541 05/07/17 1542  BP: (!) 185/87 (!) 186/94 (!) 171/89   Pulse: 71   72  Resp: 15 12  12   Temp:      TempSrc:      SpO2: 99%   100%   Eyes: PERRL, lids and conjunctivae normal ENMT: Mucous membranes are moist. Posterior pharynx clear of any exudate or lesions.Normal dentition.  Neck: normal, supple, no masses, no thyromegaly Respiratory: clear to auscultation bilaterally, no wheezing, no crackles. Normal respiratory effort. No accessory muscle use.  Cardiovascular: Regular rate and rhythm, no murmurs / rubs / gallops. No extremity edema. 2+ pedal pulses. No carotid bruits.  Abdomen: no tenderness, no masses palpated. No hepatosplenomegaly. Bowel sounds positive.  Musculoskeletal: no clubbing / cyanosis. No joint deformity upper and lower extremities. Good ROM, no contractures. Normal muscle tone.  Skin:  no rashes, lesions, ulcers. No induration Neurologic: CN 2-12 grossly intact. Sensation intact, DTR normal. Strength 5/5 in all 4.  Psychiatric: Normal mood.     Labs on Admission: I have personally reviewed following labs and imaging studies  CBC: Recent Labs  Lab 05/07/17 1212  WBC 6.6  HGB 11.0*  HCT 34.8*  MCV 89.9  PLT 338   Basic Metabolic Panel: Recent Labs  Lab 05/07/17 1212  NA 138  K 3.4*  CL 105  CO2 28  GLUCOSE 95  BUN  18  CREATININE 0.69  CALCIUM 9.2   GFR: CrCl cannot be calculated (Unknown ideal weight.). Liver Function Tests: Recent Labs  Lab 05/07/17 1212  AST 24  ALT 14  ALKPHOS 48  BILITOT 0.6  PROT 6.3*  ALBUMIN 3.6   Recent Labs  Lab 05/07/17 1212  LIPASE 26   No results for input(s): AMMONIA in the last 168 hours. Coagulation Profile: No results for input(s): INR, PROTIME in the last 168 hours. Cardiac Enzymes: No results for input(s): CKTOTAL, CKMB, CKMBINDEX, TROPONINI in the last 168 hours. BNP (last 3 results) No results for input(s): PROBNP in the last 8760 hours. HbA1C: No results for input(s): HGBA1C in the last 72 hours. CBG: No results for input(s): GLUCAP in the last 168 hours. Lipid Profile: No results for input(s): CHOL, HDL, LDLCALC, TRIG, CHOLHDL, LDLDIRECT in the last 72 hours. Thyroid Function Tests: No results for input(s): TSH, T4TOTAL, FREET4, T3FREE, THYROIDAB in the last 72 hours. Anemia Panel: No results for input(s): VITAMINB12, FOLATE, FERRITIN, TIBC, IRON, RETICCTPCT in the last 72 hours. Urine analysis:    Component Value Date/Time   COLORURINE STRAW (A) 05/07/2017 1418   APPEARANCEUR CLEAR 05/07/2017 1418   LABSPEC 1.008 05/07/2017 1418   PHURINE 7.0 05/07/2017 1418   GLUCOSEU NEGATIVE 05/07/2017 1418   GLUCOSEU NEGATIVE 02/02/2013 1500   HGBUR NEGATIVE 05/07/2017 1418   HGBUR negative 07/09/2009 1101   BILIRUBINUR NEGATIVE 05/07/2017 1418   BILIRUBINUR Neg 10/06/2012 0927   KETONESUR  NEGATIVE 05/07/2017 1418   PROTEINUR NEGATIVE 05/07/2017 1418   UROBILINOGEN 0.2 02/28/2015 2038   NITRITE NEGATIVE 05/07/2017 1418   LEUKOCYTESUR NEGATIVE 05/07/2017 1418    Radiological Exams on Admission: Dg Abd Acute W/chest  Result Date: 05/07/2017 CLINICAL DATA:  Abdominal pain and vomiting. EXAM: DG ABDOMEN ACUTE W/ 1V CHEST COMPARISON:  Two-view chest x-ray 08/19/2016. CT abdomen and pelvis 05/24/2014. FINDINGS: The heart size normal. Rightward curvature of the thoracic spine is again seen. Aortic atherosclerosis is present. Pacing wires are in place. A hiatal hernia has increased in size. Next item a moderate amount of stool is present throughout the colon. There is no obstruction. Levoconvex curvature of the lumbar spine is centered at L2. IMPRESSION: 1. Moderate stool throughout the distal colon without obstruction. 2. Increase in size hiatal hernia. 3. Scoliosis. 4.  Aortic Atherosclerosis (ICD10-I70.0). Electronically Signed   By: San Morelle M.D.   On: 05/07/2017 14:42    EKG: Independently reviewed.   Assessment/Plan Active Problems:   Coffee ground emesis   Coffee ground emesis:  -Unclear etiology.  Differentials include sepsis/esophageal stricture. Monitor overnight for nausea and vomiting. Check H&H in the morning to watch for blood loss anemia.  Symptomatic management with IV fluids antiemetics.    Hypertension Well controlled resume home medications.   Hypokalemia replace as needed.  Mild normocytic anemia   Her baseline appears to be around 13  DVT prophylaxis: Lovenox Code Status: DNR Family Communication: Discussed plan with the patient's family at bedside Disposition Plan: Possibly back to friend's home tomorrow if her H&H remained stable Consults called: EDP discussed with Dr. Ardis Hughs, [facility] gastroenterologist. Admission status: TELEMETRY.   Hosie Poisson MD [facility] Pager [phone]  If 7PM-7AM, please contact  night-coverage [email or web address] Password [email or web address]  05/07/2017, 4:22 PM

## 2017-05-07 NOTE — Plan of Care (Signed)
  Progressing Clinical Measurements: Ability to maintain clinical measurements within normal limits will improve 05/07/2017 2253 - Progressing by Talbert Forest, RN Will remain free from infection 05/07/2017 2253 - Progressing by Talbert Forest, RN Diagnostic test results will improve 05/07/2017 2253 - Progressing by Talbert Forest, RN Respiratory complications will improve 05/07/2017 2253 - Progressing by Talbert Forest, RN Cardiovascular complication will be avoided 05/07/2017 2253 - Progressing by Talbert Forest, RN Nutrition: Adequate nutrition will be maintained 05/07/2017 2253 - Progressing by Talbert Forest, RN Coping: Level of anxiety will decrease 05/07/2017 2253 - Progressing by Talbert Forest, RN Elimination: Will not experience complications related to urinary retention 05/07/2017 2253 - Progressing by Talbert Forest, RN Pain Managment: General experience of comfort will improve 05/07/2017 2253 - Progressing by Talbert Forest, RN Safety: Ability to remain free from injury will improve 05/07/2017 2253 - Progressing by Talbert Forest, RN Skin Integrity: Risk for impaired skin integrity will decrease 05/07/2017 2253 - Progressing by Talbert Forest, RN

## 2017-05-07 NOTE — ED Notes (Signed)
Report given to [id number].

## 2017-05-08 DIAGNOSIS — E876 Hypokalemia: Secondary | ICD-10-CM | POA: Diagnosis not present

## 2017-05-08 DIAGNOSIS — I10 Essential (primary) hypertension: Secondary | ICD-10-CM | POA: Diagnosis not present

## 2017-05-08 DIAGNOSIS — K920 Hematemesis: Secondary | ICD-10-CM

## 2017-05-08 DIAGNOSIS — E7849 Other hyperlipidemia: Secondary | ICD-10-CM

## 2017-05-08 DIAGNOSIS — E038 Other specified hypothyroidism: Secondary | ICD-10-CM

## 2017-05-08 LAB — BASIC METABOLIC PANEL
ANION GAP: 7 (ref 5–15)
BUN: 10 mg/dL (ref 6–20)
CHLORIDE: 107 mmol/L (ref 101–111)
CO2: 25 mmol/L (ref 22–32)
CREATININE: 0.55 mg/dL (ref 0.44–1.00)
Calcium: 8.9 mg/dL (ref 8.9–10.3)
GFR calc non Af Amer: >60 mL/min (ref >60)
Glucose, Bld: 96 mg/dL (ref 65–99)
POTASSIUM: 3.1 mmol/L — AB (ref 3.5–5.1)
Sodium: 139 mmol/L (ref 135–145)

## 2017-05-08 LAB — CBC
HEMATOCRIT: 34.3 % — AB (ref 36.0–46.0)
HEMOGLOBIN: 11.1 g/dL — AB (ref 12.0–15.0)
MCH: 28.8 pg (ref 26.0–34.0)
MCHC: 32.4 g/dL (ref 30.0–36.0)
MCV: 88.9 fL (ref 78.0–100.0)
Platelets: 310 10*3/uL (ref 150–400)
RBC: 3.86 MIL/uL — ABNORMAL LOW (ref 3.87–5.11)
RDW: 12.8 % (ref 11.5–15.5)
WBC: 4.9 10*3/uL (ref 4.0–10.5)

## 2017-05-08 MED ORDER — AMLODIPINE BESYLATE 5 MG PO TABS
5.0000 mg | ORAL_TABLET | Freq: Every day | ORAL | Status: DC
  Administered 2017-05-08: 5 mg via ORAL
  Filled 2017-05-08: qty 1

## 2017-05-08 MED ORDER — POTASSIUM CHLORIDE 20 MEQ/15ML (10%) PO SOLN
40.0000 meq | Freq: Once | ORAL | Status: AC
  Administered 2017-05-08: 40 meq via ORAL
  Filled 2017-05-08: qty 30

## 2017-05-08 MED ORDER — AMLODIPINE BESYLATE 5 MG PO TABS: 5.0000 mg | ORAL_TABLET | Freq: Every day | ORAL | 0 refills | Status: DC

## 2017-05-08 MED ORDER — AMLODIPINE BESYLATE 5 MG PO TABS: 5.0000 mg | ORAL_TABLET | Freq: Every day | ORAL | 0 refills | Status: AC

## 2017-05-08 MED ORDER — TRAMADOL HCL 50 MG PO TABS: 50.0000 mg | ORAL_TABLET | Freq: Four times a day (QID) | ORAL | 0 refills | Status: DC | PRN

## 2017-05-08 MED ORDER — HYDRALAZINE HCL 20 MG/ML IJ SOLN: 5.0000 mg | Freq: Four times a day (QID) | INTRAMUSCULAR | Status: DC | PRN

## 2017-05-08 MED ORDER — SENNA 8.6 MG PO TABS: 2.0000 | ORAL_TABLET | Freq: Every day | ORAL | 0 refills | Status: AC

## 2017-05-08 MED ORDER — AMLODIPINE BESYLATE 5 MG PO TABS: 5.0000 mg | ORAL_TABLET | Freq: Every day | ORAL | Status: DC

## 2017-05-08 MED ORDER — POLYETHYLENE GLYCOL 3350 17 G PO PACK: 17.0000 g | PACK | Freq: Every day | ORAL | 0 refills | Status: AC

## 2017-05-08 NOTE — Evaluation (Signed)
Clinical/Bedside Swallow Evaluation Patient Details  Name: Shelly Ross MRN: [id number] Date of Birth: [birth year]  Today's Date: 05/08/2017 Time: SLP Start Time (ACUTE ONLY): 1545 SLP Stop Time (ACUTE ONLY): 1600 SLP Time Calculation (min) (ACUTE ONLY): 15 min  Past Medical History:  Past Medical History:  Diagnosis Date  . Adenomatous colon polyp 2003  . Degenerative disc disease    CERVICAL SPINE,W/RADICULOPATHY  . Dementia   . GERD (gastroesophageal reflux disease)   . Headache(784.0)    not migraines, but a lot of headaches  . Hyperlipidemia   . Hypertension   . Hypothyroidism   . Osteoporosis   . Premature ventricular contractions 2006  . Rib fractures   . Stricture and stenosis of esophagus 2007  . SVT (supraventricular tachycardia) ([place])    S/P ablation 2005   Past Surgical History:  Past Surgical History:  Procedure Laterality Date  . ABDOMINAL HYSTERECTOMY     WITH OOPHORECTOMY,? BILATERAL FOR ENDOMETRIOSIS  . BLADDER TACKING    . BREAST ENHANCEMENT SURGERY    . CARDIAC ELECTROPHYSIOLOGY MAPPING AND ABLATION    . CATARACT EXTRACTION Bilateral   . COLONOSCOPY    . ENDOVENOUS ABLATION SAPHENOUS VEIN W/ LASER  07-13-2011   right greater saphenous vein, Dr Mamie Nick  . ENDOVENOUS ABLATION SAPHENOUS VEIN W/ LASER  08/2011   L ; Dr Kellie Simmering  . esophageal dilation      X 1  . G2 P2    . PERMANENT PACEMAKER INSERTION N/A 04/19/2013   MDT Adapta L iimplanted by Dr Rayann Heman for mobitz II second degree AV block  . TOTAL KNEE ARTHROPLASTY Right 04/17/2013   Procedure: RIGHT TOTAL KNEE ARTHROPLASTY, CORTISONE INJECTION LEFT KNEE;  Surgeon: Gearlean Alf, MD;  Location: [facility];  Service: Orthopedics;  Laterality: Right;   HPI:  Patient is a 83 y.o. resident of a memory care unit at a SNF with PMH: advanced demtentia, HTN, hypothyroidism, s/p pacemaker implant in 2014, who presented to the hospital with reported coffee-ground emesis episodea (not observed during hospital  admission) and two day h/o "spitting up" food.    Assessment / Plan / Recommendation Clinical Impression  Patient presents with a functional oropharyngeal swallow and based on her history, suspect that she is exhibiting a primary esophageal dysphagia. She exhbited one dry cough during PO intake but did not exhibit any other over s/s of aspiration or penetration, and her swallow initiation and laryngeal elevation were within functional limits. Patient may need to consult with her PCP and/or GI for PPI management for her GERD. SLP Visit Diagnosis: Dysphagia, pharyngoesophageal phase (R13.14)    Aspiration Risk  Mild aspiration risk    Diet Recommendation Thin liquid;Regular   Liquid Administration via: Cup;Straw Medication Administration: Whole meds with liquid Supervision: Patient able to self feed Compensations: Minimize environmental distractions;Slow rate;Small sips/bites Postural Changes: Seated upright at 90 degrees;Remain upright for at least 30 minutes after po intake    Other  Recommendations Recommended Consults: Other (Comment)(follow up with PCP and/or GI for managment of PPI for GERD) Oral Care Recommendations: Oral care BID   Follow up Recommendations None      Frequency and Duration   N/A         Prognosis   N/A     Swallow Study   General Date of Onset: 05/07/17 HPI: Patient is a 83 y.o. resident of a memory care unit at a SNF with PMH: advanced demtentia, HTN, hypothyroidism, s/p pacemaker implant in 2014, who presented to the  hospital with reported coffee-ground emesis episodea (not observed during hospital admission) and two day h/o "spitting up" food.  Type of Study: Bedside Swallow Evaluation Previous Swallow Assessment: N/A Diet Prior to this Study: Thin liquids(clear liquids) Temperature Spikes Noted: No Respiratory Status: Room air History of Recent Intubation: No Behavior/Cognition: Alert;Pleasant mood;Confused;Requires cueing Oral Cavity Assessment:  Within Functional Limits Oral Care Completed by SLP: No Oral Cavity - Dentition: Adequate natural dentition Self-Feeding Abilities: Able to feed self Patient Positioning: Upright in bed Baseline Vocal Quality: Normal Volitional Cough: Cognitively unable to elicit Volitional Swallow: Unable to elicit    Oral/Motor/Sensory Function Overall Oral Motor/Sensory Function: Within functional limits   Ice Chips Ice chips: Not tested   Thin Liquid Thin Liquid: Impaired Presentation: Cup;Straw Pharyngeal  Phase Impairments: Throat Clearing - Delayed Other Comments: Patient exhibited one dry cough during PO intake, but not other overt s/s of aspiration or penetration    Nectar Thick Nectar Thick Liquid: Not tested   Honey Thick Honey Thick Liquid: Not tested   Puree Puree: Within functional limits Presentation: Spoon   Solid   GO   Solid: Within functional limits Presentation: Self Fed    Functional Assessment Tool Used: bedside swallow evaluation, clinical judgement Functional Limitations: Swallowing Swallow Current Status (D6222): 0 percent impaired, limited or restricted Swallow Goal Status (L7989): 0 percent impaired, limited or restricted Swallow Discharge Status [phone]): 0 percent impaired, limited or restricted   Sonia Baller, MA, CCC-SLP 05/08/17 4:16 PM

## 2017-05-08 NOTE — Progress Notes (Signed)
PROGRESS NOTE        PATIENT DETAILS Name: Shelly Ross Age: 83 y.o. Sex: female Date of Birth: [birth year] Admit Date: 05/07/2017 Admitting Physician Hosie Poisson, MD JOA:CZYSAY, Heinz Knuckles, MD  Brief Narrative: Patient is a 83 y.o. female with history of advanced dementia, hypertension, hypothyroidism Mobitz type II heart block status post pacemaker implantation in 2014, presented to the hospital on 12/7 from a SNF for evaluation of episode of possible coffee-ground emesis, she apparently was having persistent "spitting up" food for the past 2 days as well.  She was subsequently admitted to the hospitalist service for further evaluation.  See below for further details.  Subjective: Very pleasantly confused-she has advanced dementia-very poor historian.  Per RN, no vomiting or "spitting up" noted overnight or this morning.    Assessment/Plan: ?  Coffee-ground emesis: Doubt GI bleeding-no recurrence since admission.  Apparently patient was also FOBT positive in the nursing home, hemoglobin is stable. Unless she overtly bleeds, given advanced dementia-she would be a poor candidate to pursue inpatient endoscopic evaluation.  Vomiting/regurgitation: Not noted since admission-but per family (son) this is been ongoing for almost a week-apparently she may have had prior esophageal strictures in the past-poor historian due to advanced dementia-we will await speech therapy evaluation-if dysphagia is found-may need further evaluation given history of persistent symptoms.  Hypokalemia: We will replete and recheck  Hypothyroidism: Continue Synthroid  GERD: Continue PPI  Advanced dementia: Very pleasantly confused-but somewhat redirectable.  History of Mobitz type II second-degree heart block requiring pacemaker implantation in 2014  DVT Prophylaxis: Prophylactic Lovenox  Code Status: DNR  Family Communication: Son at bedside  Disposition Plan: Remain  inpatient-back to SNF on discharge either later today or tomorrow.  Antimicrobial agents: Anti-infectives (From admission, onward)   None      Procedures: None  CONSULTS: None  Time spent: 25- minutes-Greater than 50% of this time was spent in counseling, explanation of diagnosis, planning of further management, and coordination of care.  MEDICATIONS: Scheduled Meds: . busPIRone  15 mg Oral BID  . enoxaparin (LOVENOX) injection  40 mg Subcutaneous Q24H  . fesoterodine  4 mg Oral Daily  . levothyroxine  37.5 mcg Oral Once per day on Sun Mon Tue Thu Fri Sat  . [START ON 05/12/2017] levothyroxine  75 mcg Oral Once per day on Wed  . pantoprazole  40 mg Oral Q breakfast   Continuous Infusions: PRN Meds:.polyvinyl alcohol   PHYSICAL EXAM: Vital signs: Vitals:   05/07/17 1624 05/07/17 1717 05/07/17 2055 05/08/17 0609  BP: (!) 173/93  (!) 163/89 (!) 169/74  Pulse: 70  73 77  Resp:   18 16  Temp: 97.9 F (36.6 C)  97.9 F (36.6 C) 98.2 F (36.8 C)  TempSrc: Oral  Oral Oral  SpO2: 100%  98% 99%  Weight:  59.7 kg (131 lb 9.8 oz)    Height:  5\' 2"  (1.575 m)     Filed Weights   05/07/17 1717  Weight: 59.7 kg (131 lb 9.8 oz)   Body mass index is 24.07 kg/m.   General appearance :Awake, pleasantly confused-very frail appearing and chronically sick looking. Eyes:, No scleral icterus.  No pallor. HEENT: Atraumatic and Normocephalic Neck: supple, no JVD. No cervical lymphadenopathy. No thyromegaly Resp:Good air entry bilaterally, no added sounds  CVS: S1 S2 regular GI: Bowel sounds present, Non tender  and not distended with no gaurding, rigidity or rebound.No organomegaly Extremities: B/L Lower Ext shows no edema, both legs are warm to touch Neurology: 4 extremities-given advanced dementia does not follow commands. Musculoskeletal:No digital cyanosis Skin:No Rash, warm and dry Wounds:N/A  I have personally reviewed following labs and imaging studies  LABORATORY  DATA: CBC: Recent Labs  Lab 05/07/17 1212 05/08/17 0756  WBC 6.6 4.9  HGB 11.0* 11.1*  HCT 34.8* 34.3*  MCV 89.9 88.9  PLT 340 086    Basic Metabolic Panel: Recent Labs  Lab 05/07/17 1212 05/08/17 0756  NA 138 139  K 3.4* 3.1*  CL 105 107  CO2 28 25  GLUCOSE 95 96  BUN 18 10  CREATININE 0.69 0.55  CALCIUM 9.2 8.9    GFR: Estimated Creatinine Clearance: 37 mL/min (by C-G formula based on SCr of 0.55 mg/dL).  Liver Function Tests: Recent Labs  Lab 05/07/17 1212  AST 24  ALT 14  ALKPHOS 48  BILITOT 0.6  PROT 6.3*  ALBUMIN 3.6   Recent Labs  Lab 05/07/17 1212  LIPASE 26   No results for input(s): AMMONIA in the last 168 hours.  Coagulation Profile: No results for input(s): INR, PROTIME in the last 168 hours.  Cardiac Enzymes: No results for input(s): CKTOTAL, CKMB, CKMBINDEX, TROPONINI in the last 168 hours.  BNP (last 3 results) No results for input(s): PROBNP in the last 8760 hours.  HbA1C: No results for input(s): HGBA1C in the last 72 hours.  CBG: No results for input(s): GLUCAP in the last 168 hours.  Lipid Profile: No results for input(s): CHOL, HDL, LDLCALC, TRIG, CHOLHDL, LDLDIRECT in the last 72 hours.  Thyroid Function Tests: No results for input(s): TSH, T4TOTAL, FREET4, T3FREE, THYROIDAB in the last 72 hours.  Anemia Panel: No results for input(s): VITAMINB12, FOLATE, FERRITIN, TIBC, IRON, RETICCTPCT in the last 72 hours.  Urine analysis:    Component Value Date/Time   COLORURINE STRAW (A) 05/07/2017 1418   APPEARANCEUR CLEAR 05/07/2017 1418   LABSPEC 1.008 05/07/2017 1418   PHURINE 7.0 05/07/2017 1418   GLUCOSEU NEGATIVE 05/07/2017 1418   GLUCOSEU NEGATIVE 02/02/2013 1500   HGBUR NEGATIVE 05/07/2017 1418   HGBUR negative 07/09/2009 1101   BILIRUBINUR NEGATIVE 05/07/2017 1418   BILIRUBINUR Neg 10/06/2012 0927   KETONESUR NEGATIVE 05/07/2017 1418   PROTEINUR NEGATIVE 05/07/2017 1418   UROBILINOGEN 0.2 02/28/2015 2038    NITRITE NEGATIVE 05/07/2017 1418   LEUKOCYTESUR NEGATIVE 05/07/2017 1418    Sepsis Labs: Lactic Acid, Venous    Component Value Date/Time   LATICACIDVEN 1.98 07/20/2015 1541    MICROBIOLOGY: Recent Results (from the past 240 hour(s))  MRSA PCR Screening     Status: None   Collection Time: 05/07/17  5:17 PM  Result Value Ref Range Status   MRSA by PCR NEGATIVE NEGATIVE Final    Comment:        The GeneXpert MRSA Assay (FDA approved for NASAL specimens only), is one component of a comprehensive MRSA colonization surveillance program. It is not intended to diagnose MRSA infection nor to guide or monitor treatment for MRSA infections.     RADIOLOGY STUDIES/RESULTS: Dg Abd Acute W/chest  Result Date: 05/07/2017 CLINICAL DATA:  Abdominal pain and vomiting. EXAM: DG ABDOMEN ACUTE W/ 1V CHEST COMPARISON:  Two-view chest x-ray 08/19/2016. CT abdomen and pelvis 05/24/2014. FINDINGS: The heart size normal. Rightward curvature of the thoracic spine is again seen. Aortic atherosclerosis is present. Pacing wires are in place. A hiatal hernia has  increased in size. Next item a moderate amount of stool is present throughout the colon. There is no obstruction. Levoconvex curvature of the lumbar spine is centered at L2. IMPRESSION: 1. Moderate stool throughout the distal colon without obstruction. 2. Increase in size hiatal hernia. 3. Scoliosis. 4.  Aortic Atherosclerosis (ICD10-I70.0). Electronically Signed   By: San Morelle M.D.   On: 05/07/2017 14:42     LOS: 0 days   Oren Binet, MD  [facility] Pager:[phone]  If 7PM-7AM, please contact night-coverage [email or web address] Password [email or web address] 05/08/2017, 1:30 PM

## 2017-05-08 NOTE — Progress Notes (Signed)
Report called to Vicente Males, Teacher, English as a foreign language at [facility] [phone]. Questions, concerns denied. Pt is stable and at baseline a&o self ambulatory without assist, Stauch use.

## 2017-05-08 NOTE — Clinical Social Work Note (Signed)
Patient medically stable for discharge back to [facility] are this evening. Son advised of discharge and will provide transportation for patient back to facility. Nurse provided with phone number to call report. Claiborne Billings, admissions director contacted regarding discharge and d/c clinicals transmitted to facility. Per Claiborne Billings, FL-2 not needed as patient admitted to hospital on 12/7. CSW signing off as patient will d/c back to facility this evening, transported by son.  Xxavier Noon Givens, MSW, LCSW Licensed Clinical Social Worker Clinical Social Work [place] [phone] ([place] weekend phone)

## 2017-05-08 NOTE — Discharge Summary (Addendum)
PATIENT DETAILS Name: Shelly Ross Age: 83 y.o. Sex: female Date of Birth: 07-27-1924 MRN: [id number]. Admitting Physician: Hosie Poisson, MD VOZ:DGUYQI, Heinz Knuckles, MD  Admit Date: 05/07/2017 Discharge date: 05/08/2017  Recommendations for Outpatient Follow-up:  1. Follow up with PCP in 1-2 weeks 2. Please obtain BMP/CBC in one week 3. Ensure follow-up with speech therapy at SNF 4. Consider outpatient GI evaluation if symptoms of vomiting/regurgitation recur.   5. Consider outpatient GI evaluation for FOBT positive stools as well.   Admitted From:  SNF  Disposition: SNF    Home Health: No  Equipment/Devices: None  Discharge Condition: Stable  CODE STATUS:  DNR  Diet recommendation:  Heart Healthy Thin liquid;Regular  Liquid Administration via: Cup;Straw Medication Administration: Whole meds with liquid Supervision: Patient able to self feed Compensations: Minimize environmental distractions;Slow rate;Small sips/bites Postural Changes: Seated upright at 90 degrees;Remain upright for at least 30 minutes after po intake   Brief Summary: See H&P, Labs, Consult and Test reports for all details in brief, Patient is a 83 y.o. female with history of advanced dementia, hypertension, hypothyroidism Mobitz type II heart block status post pacemaker implantation in 2014, presented to the hospital on 12/7 from a SNF for evaluation of episode of possible coffee-ground emesis, she apparently was having persistent "spitting up" food for the past 2 days as well.  She was subsequently admitted to the hospitalist service for further evaluation.  See below for further details.   Brief Hospital Course: ?  Coffee-ground emesis: Doubt GI bleeding-no recurrence since admission.  Apparently patient was also FOBT positive in the nursing home, hemoglobin is stable. Unless she overtly bleeds, given advanced dementia-she would be a poor candidate to pursue inpatient endoscopic  evaluation.  Vomiting/regurgitation: Not noted since admission-but per family (son) this is been ongoing for almost a week-apparently she may have had prior esophageal strictures in the past-poor historian due to advanced dementia-she was seen by speech therapy-found to have no major signs of dysphagia-recommendations are to continue regular diet with thin liquids.  If symptoms reoccur, consider outpatient GI evaluation and outpatient speech therapy evaluation.  As noted above, these symptoms were not evident during this hospital course.  Please note, x-ray of the abdomen did not show any acute abnormalities-it did show some constipation.  Constipation: Seen on x-ray during this hospital stay-will be discharged on MiraLAX and senna.  Tramadol appears to be on her medication list-this appears to be scheduled-I have changed it to as needed.  Please titrate dosing of MiraLAX/Senokot depending on bowel movements.  Hypokalemia:  This was repleted earlier this morning, please check the next few days at [facility].  Hypertension: I have started low-dose amlodipine-please continue to titrate in the outpatient setting.  Hypothyroidism: Continue Synthroid  GERD: Continue PPI  Advanced dementia: Very pleasantly confused-but somewhat redirectable.  History of Mobitz type II second-degree heart block requiring pacemaker implantation in 2014  Procedures/Studies: None  Discharge Diagnoses:  Active Problems:   Hypothyroidism   Essential hypertension   Hypokalemia   HLD (hyperlipidemia)   Coffee ground emesis   Discharge Instructions:  Activity:  As tolerated with Full fall precautions use Schiano/cane & assistance as needed   Discharge Instructions    Diet - low sodium heart healthy   Complete by:  As directed    Thin liquid;Regular   Liquid Administration via: Cup;Straw Medication Administration: Whole meds with liquid Supervision: Patient able to self feed Compensations: Minimize  environmental distractions;Slow rate;Small sips/bites Postural Changes: Seated upright at 90 degrees;Remain  upright for at least 30 minutes after po intake   Discharge instructions   Complete by:  As directed    Follow with Primary MD  Norins, Heinz Knuckles, MD  and other consultant's as instructed your Hospitalist MD  Please get a complete blood count and chemistry panel checked by your Primary MD at your next visit, and again as instructed by your Primary MD.  Get Medicines reviewed and adjusted: Please take all your medications with you for your next visit with your Primary MD  Laboratory/radiological data: Please request your Primary MD to go over all hospital tests and procedure/radiological results at the follow up, please ask your Primary MD to get all Hospital records sent to his/her office.  In some cases, they will be blood work, cultures and biopsy results pending at the time of your discharge. Please request that your primary care M.D. follows up on these results.  Also Note the following: If you experience worsening of your admission symptoms, develop shortness of breath, life threatening emergency, suicidal or homicidal thoughts you must seek medical attention immediately by calling 911 or calling your MD immediately  if symptoms less severe.  You must read complete instructions/literature along with all the possible adverse reactions/side effects for all the Medicines you take and that have been prescribed to you. Take any new Medicines after you have completely understood and accpet all the possible adverse reactions/side effects.   Do not drive when taking Pain medications or sleeping medications (Benzodaizepines)  Do not take more than prescribed Pain, Sleep and Anxiety Medications. It is not advisable to combine anxiety,sleep and pain medications without talking with your primary care practitioner  Special Instructions: If you have smoked or chewed Tobacco  in the last 2 yrs  please stop smoking, stop any regular Alcohol  and or any Recreational drug use.  Wear Seat belts while driving.  Please note: You were cared for by a hospitalist during your hospital stay. Once you are discharged, your primary care physician will handle any further medical issues. Please note that NO REFILLS for any discharge medications will be authorized once you are discharged, as it is imperative that you return to your primary care physician (or establish a relationship with a primary care physician if you do not have one) for your post hospital discharge needs so that they can reassess your need for medications and monitor your lab values.   Increase activity slowly   Complete by:  As directed      Allergies as of 05/08/2017      Reactions   Lansoprazole Nausea Only, Other (See Comments)   Reaction:  Headaches    Percocet [oxycodone-acetaminophen] Nausea And Vomiting   Aricept [donepezil Hcl]    Unknown reaction per MAR    Exelon [rivastigmine Tartrate]    Unknown reaction per MAR    Namenda [memantine]    Unknown reaction per [facility]       Medication List    TAKE these medications   amLODipine 5 MG tablet Commonly known as:  NORVASC Take 1 tablet (5 mg total) by mouth daily.   busPIRone 10 MG tablet Commonly known as:  BUSPAR Take 15 mg by mouth 2 (two) times daily.   Cranberry 450 MG Tabs Take 1 tablet by mouth daily with breakfast.   diphenhydrAMINE 25 MG tablet Commonly known as:  BENADRYL Take 12.5 mg by mouth at bedtime as needed for sleep.   hydroxypropyl methylcellulose / hypromellose 2.5 % ophthalmic solution Commonly known  as:  ISOPTO TEARS / GONIOVISC Place 1 drop into both eyes daily as needed for dry eyes.   levothyroxine 75 MCG tablet Commonly known as:  SYNTHROID, LEVOTHROID Take 37.5-75 mcg by mouth daily before breakfast. Takes 37.5 mcg every day except 75 mcg on Wednesday   MINTOX 200-200-20 MG/5ML suspension Generic drug:  alum & mag  hydroxide-simeth Take 30 mLs by mouth every 6 (six) hours as needed for indigestion or heartburn.   pantoprazole 40 MG tablet Commonly known as:  PROTONIX Take 40 mg by mouth daily with breakfast.   polyethylene glycol packet Commonly known as:  MIRALAX Take 17 g by mouth daily.   senna 8.6 MG Tabs tablet Commonly known as:  SENOKOT Take 2 tablets (17.2 mg total) by mouth at bedtime.   tolterodine 4 MG 24 hr capsule Commonly known as:  DETROL LA Take 4 mg by mouth daily with breakfast.   traMADol 50 MG tablet Commonly known as:  ULTRAM Take 1 tablet (50 mg total) by mouth every 6 (six) hours as needed. What changed:    when to take this  reasons to take this      Follow-up Information    Norins, Heinz Knuckles, MD. Schedule an appointment as soon as possible for a visit in 1 week(s).   Specialty:  Internal Medicine Contact information: [place] [address] [phone]          Allergies  Allergen Reactions  . Lansoprazole Nausea Only and Other (See Comments)    Reaction:  Headaches   . Percocet [Oxycodone-Acetaminophen] Nausea And Vomiting  . Aricept [Donepezil Hcl]     Unknown reaction per MAR   . Exelon [Rivastigmine Tartrate]     Unknown reaction per MAR   . Namenda [Memantine]     Unknown reaction per [facility]     Consultations: None   Other Procedures/Studies: Dg Abd Acute W/chest  Result Date: 05/07/2017 CLINICAL DATA:  Abdominal pain and vomiting. EXAM: DG ABDOMEN ACUTE W/ 1V CHEST COMPARISON:  Two-view chest x-ray 08/19/2016. CT abdomen and pelvis 05/24/2014. FINDINGS: The heart size normal. Rightward curvature of the thoracic spine is again seen. Aortic atherosclerosis is present. Pacing wires are in place. A hiatal hernia has increased in size. Next item a moderate amount of stool is present throughout the colon. There is no obstruction. Levoconvex curvature of the lumbar spine is centered at L2. IMPRESSION: 1. Moderate stool throughout  the distal colon without obstruction. 2. Increase in size hiatal hernia. 3. Scoliosis. 4.  Aortic Atherosclerosis (ICD10-I70.0). Electronically Signed   By: San Morelle M.D.   On: 05/07/2017 14:42      TODAY-DAY OF DISCHARGE:  Subjective:   Shelly Ross today remains pleasantly confused.  Per RN, no overt GI bleeding with melena or hematochezia evident.  No symptoms of spitting of vomiting evident since admission.  Objective:   Blood pressure (!) 184/98, pulse 80, temperature 98.2 F (36.8 C), temperature source Oral, resp. rate 18, height 5\' 2"  (1.575 m), weight 59.7 kg (131 lb 9.8 oz), SpO2 100 %.  Intake/Output Summary (Last 24 hours) at 05/08/2017 1624 Last data filed at 05/08/2017 1300 Gross per 24 hour  Intake 840 ml  Output -  Net 840 ml   Filed Weights   05/07/17 1717  Weight: 59.7 kg (131 lb 9.8 oz)    Exam: Very pleasantly confused [place].AT,PERRAL Supple Neck,No JVD, No cervical lymphadenopathy appriciated.  Symmetrical Chest wall movement, Good air movement bilaterally, CTAB RRR,No Gallops,Rubs  or new Murmurs, No Parasternal Heave +ve B.Sounds, Abd Soft, Non tender, No organomegaly appriciated, No rebound -guarding or rigidity. No Cyanosis, Clubbing or edema, No new Rash or bruise   PERTINENT RADIOLOGIC STUDIES: Dg Abd Acute W/chest  Result Date: 05/07/2017 CLINICAL DATA:  Abdominal pain and vomiting. EXAM: DG ABDOMEN ACUTE W/ 1V CHEST COMPARISON:  Two-view chest x-ray 08/19/2016. CT abdomen and pelvis 05/24/2014. FINDINGS: The heart size normal. Rightward curvature of the thoracic spine is again seen. Aortic atherosclerosis is present. Pacing wires are in place. A hiatal hernia has increased in size. Next item a moderate amount of stool is present throughout the colon. There is no obstruction. Levoconvex curvature of the lumbar spine is centered at L2. IMPRESSION: 1. Moderate stool throughout the distal colon without obstruction. 2. Increase in size hiatal  hernia. 3. Scoliosis. 4.  Aortic Atherosclerosis (ICD10-I70.0). Electronically Signed   By: San Morelle M.D.   On: 05/07/2017 14:42     PERTINENT LAB RESULTS: CBC: Recent Labs    05/07/17 1212 05/08/17 0756  WBC 6.6 4.9  HGB 11.0* 11.1*  HCT 34.8* 34.3*  PLT 340 310   CMET CMP     Component Value Date/Time   NA 139 05/08/2017 0756   K 3.1 (L) 05/08/2017 0756   CL 107 05/08/2017 0756   CO2 25 05/08/2017 0756   GLUCOSE 96 05/08/2017 0756   BUN 10 05/08/2017 0756   CREATININE 0.55 05/08/2017 0756   CALCIUM 8.9 05/08/2017 0756   PROT 6.3 (L) 05/07/2017 1212   ALBUMIN 3.6 05/07/2017 1212   AST 24 05/07/2017 1212   ALT 14 05/07/2017 1212   ALKPHOS 48 05/07/2017 1212   BILITOT 0.6 05/07/2017 1212   GFRNONAA >60 05/08/2017 0756   GFRAA >60 05/08/2017 0756    GFR Estimated Creatinine Clearance: 37 mL/min (by C-G formula based on SCr of 0.55 mg/dL). Recent Labs    05/07/17 1212  LIPASE 26   No results for input(s): CKTOTAL, CKMB, CKMBINDEX, TROPONINI in the last 72 hours. Invalid input(s): POCBNP No results for input(s): DDIMER in the last 72 hours. No results for input(s): HGBA1C in the last 72 hours. No results for input(s): CHOL, HDL, LDLCALC, TRIG, CHOLHDL, LDLDIRECT in the last 72 hours. No results for input(s): TSH, T4TOTAL, T3FREE, THYROIDAB in the last 72 hours.  Invalid input(s): FREET3 No results for input(s): VITAMINB12, FOLATE, FERRITIN, TIBC, IRON, RETICCTPCT in the last 72 hours. Coags: No results for input(s): INR in the last 72 hours.  Invalid input(s): PT Microbiology: Recent Results (from the past 240 hour(s))  MRSA PCR Screening     Status: None   Collection Time: 05/07/17  5:17 PM  Result Value Ref Range Status   MRSA by PCR NEGATIVE NEGATIVE Final    Comment:        The GeneXpert MRSA Assay (FDA approved for NASAL specimens only), is one component of a comprehensive MRSA colonization surveillance program. It is not intended to  diagnose MRSA infection nor to guide or monitor treatment for MRSA infections.     FURTHER DISCHARGE INSTRUCTIONS:  Get Medicines reviewed and adjusted: Please take all your medications with you for your next visit with your Primary MD  Laboratory/radiological data: Please request your Primary MD to go over all hospital tests and procedure/radiological results at the follow up, please ask your Primary MD to get all Hospital records sent to his/her office.  In some cases, they will be blood work, cultures and biopsy results pending at the  time of your discharge. Please request that your primary care M.D. goes through all the records of your hospital data and follows up on these results.  Also Note the following: If you experience worsening of your admission symptoms, develop shortness of breath, life threatening emergency, suicidal or homicidal thoughts you must seek medical attention immediately by calling 911 or calling your MD immediately  if symptoms less severe.  You must read complete instructions/literature along with all the possible adverse reactions/side effects for all the Medicines you take and that have been prescribed to you. Take any new Medicines after you have completely understood and accpet all the possible adverse reactions/side effects.   Do not drive when taking Pain medications or sleeping medications (Benzodaizepines)  Do not take more than prescribed Pain, Sleep and Anxiety Medications. It is not advisable to combine anxiety,sleep and pain medications without talking with your primary care practitioner  Special Instructions: If you have smoked or chewed Tobacco  in the last 2 yrs please stop smoking, stop any regular Alcohol  and or any Recreational drug use.  Wear Seat belts while driving.  Please note: You were cared for by a hospitalist during your hospital stay. Once you are discharged, your primary care physician will handle any further medical issues.  Please note that NO REFILLS for any discharge medications will be authorized once you are discharged, as it is imperative that you return to your primary care physician (or establish a relationship with a primary care physician if you do not have one) for your post hospital discharge needs so that they can reassess your need for medications and monitor your lab values.  Total Time spent coordinating discharge including counseling, education and face to face time equals 35 minutes.  SignedOren Binet 05/08/2017 4:24 PM

## 2017-05-08 NOTE — Clinical Social Work Note (Signed)
Clinical Social Work Assessment  Patient Details  Name: Shelly Ross MRN: [id number] Date of Birth: January 01, 1925  Date of referral:  05/08/17               Reason for consult:  Facility Placement(Patient from [facility])                Permission sought to share information with:  Family Supports Permission granted to share information::  No(Patient oriented to self only)  Name::     Shelly Ross  Agency::     Relationship::  Son  Contact Information:  [phone]  Housing/Transportation Living arrangements for the past 2 months:  Skilled Nursing Facility([facility]) Source of Information:  Adult Children Patient Interpreter Needed:  None Criminal Activity/Legal Involvement Pertinent to Current Situation/Hospitalization:  No - Comment as needed Significant Relationships:  Adult Children Lives with:  Facility Resident Do you feel safe going back to the place where you live?  Yes Need for family participation in patient care:  Yes (Comment)  Care giving concerns:  Son expressed no concerns regarding patient's care at [facility].   Social Worker assessment / plan:  CSW talked with son by phone and confirmed that patient will return to [facility].   Employment status:  Retired Forensic scientist:  Managed Care PT Recommendations:  Not assessed at this time Information / Referral to community resources:  Other (Comment Required)(Information not requested or needed as patient from [facility])  Patient/Family's Response to care:  No concern expressed by sone regarding patient's care during hospitalization.  Patient/Family's Understanding of and Emotional Response to Diagnosis, Current Treatment, and Prognosis: Not discussed.  Emotional Assessment Appearance:  Other (Comment Required(Did not visit with patient, talked to son by phone) Attitude/Demeanor/Rapport:  Unable to Assess Affect (typically observed):  Unable to Assess Orientation:  Oriented to  Self Alcohol / Substance use:  Never Used Psych involvement (Current and /or in the community):  No (Comment)  Discharge Needs  Concerns to be addressed:  Discharge Planning Concerns Readmission within the last 30 days:  No Current discharge risk:  None Barriers to Discharge:  No Barriers Identified   [name], LCSW 05/08/2017, 5:56 PM

## 2017-05-08 NOTE — Progress Notes (Addendum)
Seen and examined-pleasantly confused.  Spoke with son at bedside-apparently transferred from SNF due to persistent "spitting".  Per nursing staff-they have not witnessed any spitting or vomiting.   Apparently  there was some concern for GI bleeding-but hemoglobin is stable-no overt bleeding noted by the nursing staff.  She has advanced dementia-although she is FOBT positive-I do not think she needs inpatient endoscopic evaluation unless she overtly bleeds.   Plans are to await speech therapy evaluation to make sure she does not have any major dysphagia issues before consideration of discharge back to SNF.  I have spoken with the patient's son, he is okay with this plan.  Full note to follow later.

## 2017-05-20 ENCOUNTER — Ambulatory Visit (INDEPENDENT_AMBULATORY_CARE_PROVIDER_SITE_OTHER): Payer: Medicare Other | Admitting: *Deleted

## 2017-05-20 DIAGNOSIS — I441 Atrioventricular block, second degree: Secondary | ICD-10-CM

## 2017-05-20 NOTE — Progress Notes (Signed)
Remote pacemaker transmission.   

## 2017-05-21 ENCOUNTER — Encounter: Payer: Self-pay | Admitting: Cardiology

## 2017-05-24 ENCOUNTER — Ambulatory Visit: Payer: Medicare Other | Admitting: Gastroenterology

## 2017-06-03 LAB — CUP PACEART REMOTE DEVICE CHECK
Battery Voltage: 2.79 V
Brady Statistic AP VS Percent: 0 %
Brady Statistic AS VS Percent: 1 %
Date Time Interrogation Session: 20181220162309
Implantable Lead Implant Date: 20141119
Implantable Lead Location: 753859
Lead Channel Pacing Threshold Pulse Width: 0.4 ms
Lead Channel Pacing Threshold Pulse Width: 0.4 ms
Lead Channel Setting Pacing Pulse Width: 0.4 ms
Lead Channel Setting Sensing Sensitivity: 5.6 mV
MDC IDC LEAD IMPLANT DT: 20141119
MDC IDC LEAD LOCATION: 753860
MDC IDC MSMT BATTERY IMPEDANCE: 227 Ohm
MDC IDC MSMT BATTERY REMAINING LONGEVITY: 114 mo
MDC IDC MSMT LEADCHNL RA IMPEDANCE VALUE: 574 Ohm
MDC IDC MSMT LEADCHNL RA PACING THRESHOLD AMPLITUDE: 0.75 V
MDC IDC MSMT LEADCHNL RV IMPEDANCE VALUE: 721 Ohm
MDC IDC MSMT LEADCHNL RV PACING THRESHOLD AMPLITUDE: 0.625 V
MDC IDC PG IMPLANT DT: 20141119
MDC IDC SET LEADCHNL RA PACING AMPLITUDE: 2 V
MDC IDC SET LEADCHNL RV PACING AMPLITUDE: 2.5 V
MDC IDC STAT BRADY AP VP PERCENT: 12 %
MDC IDC STAT BRADY AS VP PERCENT: 87 %

## 2017-06-08 ENCOUNTER — Ambulatory Visit: Payer: Medicare Other | Admitting: Gastroenterology

## 2017-06-15 ENCOUNTER — Other Ambulatory Visit (INDEPENDENT_AMBULATORY_CARE_PROVIDER_SITE_OTHER): Payer: Medicare Other

## 2017-06-15 ENCOUNTER — Ambulatory Visit: Payer: Medicare Other | Admitting: Physician Assistant

## 2017-06-15 ENCOUNTER — Encounter: Payer: Self-pay | Admitting: Physician Assistant

## 2017-06-15 VITALS — BP 130/70 | HR 80 | Ht 61.0 in | Wt 131.6 lb

## 2017-06-15 DIAGNOSIS — D649 Anemia, unspecified: Secondary | ICD-10-CM | POA: Diagnosis not present

## 2017-06-15 DIAGNOSIS — R112 Nausea with vomiting, unspecified: Secondary | ICD-10-CM | POA: Diagnosis not present

## 2017-06-15 DIAGNOSIS — K920 Hematemesis: Secondary | ICD-10-CM | POA: Diagnosis not present

## 2017-06-15 LAB — CBC WITH DIFFERENTIAL/PLATELET
BASOS PCT: 0.6 % (ref 0.0–3.0)
Basophils Absolute: 0 10*3/uL (ref 0.0–0.1)
EOS ABS: 0.3 10*3/uL (ref 0.0–0.7)
EOS PCT: 4.2 % (ref 0.0–5.0)
HEMATOCRIT: 34.9 % — AB (ref 36.0–46.0)
Hemoglobin: 11 g/dL — ABNORMAL LOW (ref 12.0–15.0)
LYMPHS PCT: 28.7 % (ref 12.0–46.0)
Lymphs Abs: 1.9 10*3/uL (ref 0.7–4.0)
MCHC: 31.5 g/dL (ref 30.0–36.0)
MCV: 81.2 fl (ref 78.0–100.0)
MONO ABS: 0.7 10*3/uL (ref 0.1–1.0)
Monocytes Relative: 10.4 % (ref 3.0–12.0)
NEUTROS ABS: 3.6 10*3/uL (ref 1.4–7.7)
Neutrophils Relative %: 56.1 % (ref 43.0–77.0)
PLATELETS: 447 10*3/uL — AB (ref 150.0–400.0)
RBC: 4.3 Mil/uL (ref 3.87–5.11)
RDW: 15.1 % (ref 11.5–15.5)
WBC: 6.5 10*3/uL (ref 4.0–10.5)

## 2017-06-15 MED ORDER — PANTOPRAZOLE SODIUM 40 MG PO TBEC: 40.0000 mg | DELAYED_RELEASE_TABLET | Freq: Two times a day (BID) | ORAL | 3 refills | Status: AC

## 2017-06-15 NOTE — Patient Instructions (Addendum)
Your physician has requested that you go to the basement for lab work before leaving today.   You have been scheduled for an endoscopy. Please follow written instructions given to you at your visit today. If you use inhalers (even only as needed), please bring them with you on the day of your procedure. Your physician has requested that you go to www.startemmi.com and enter the access code given to you at your visit today. This web site gives a general overview about your procedure. However, you should still follow specific instructions given to you by our office regarding your preparation for the procedure.  Increase pantoprazole to 40 mg twice a day.

## 2017-06-15 NOTE — Progress Notes (Addendum)
Chief Complaint: Hematemesis  HPI:    Shelly Ross is a 84 year old Caucasian female with a past medical history as listed below including advanced dementia, hypertension, hypothyroidism and type II heart block status post pacemaker implantation in 2014, who previously followed in our clinic with Dr. Sharlett Iles, and was referred to me by [name], MD for follow-up after recent hospitalization for anemia due to suspected coffee-ground emesis.    Per review of chart patient was admitted to the hospital 05/07/17-05/08/17.  Apparently there was history prior to patient's admission of some coffee-ground emesis, apparently patient also had FOBT positive stool in the nursing home.  Her hemoglobin was stable during admission at 11.1.  Per son during hospitalization patient had a history of esophageal strictures and a recent history for a week of vomiting/regurgitation.  This was not witnessed during her admission.  Patient was seen by speech therapy and found to have no major signs of dysphagia and it was recommended she continue on a regular diet with thin liquids.  It was discussed that if symptoms recur that she consider outpatient GI eval.  Patient did have constipation on her x-ray during hospital stay and was discharged on MiraLAX and senna.    History of colonoscopy 11/27/05 with Dr. Sharlett Iles done for positive fecal occult blood testing with finding of diverticulosis in the descending colon to sigmoid colon.  EGD completed 07/08/05 for dysphagia and reflux symptoms with a finding of an esophageal stricture, which was dilated to 18 mm, hiatal hernia and chronic GERD.    Today, the patient presents to clinic accompanied by her son and daughter.  They do provide her history as she has advanced dementia.  Together they explain that for over a year the patient has been spitting up "specks of black stuff that looks like a coffee grounds".  Apparently, this got worse in November and over the past 2 months she  has been having episodes of vomiting up a black coffee-ground material.  They do show me a picture on their phones of what this looks like (black material) and tell me that it can be "quite a large amount".  This is typically 2-3 days a week.  They explain that it is typically worse around 8 or 9 PM.  It has been increasing in frequency and amount per nursing staff. The patient does eat dinner at her nursing facility around 5.  It does not tend to matter what she eats.  To their knowledge the patient does not complain of any abdominal pain and they are unaware of any changes in bowel habits.  She does use tramadol for a right shoulder injury but they deny any NSAIDs. She is on Pantoprazole 40mg  qd per her MAR.  They deny any weight loss, heartburn or reflux.    Past Medical History:  Diagnosis Date  . Adenomatous colon polyp 2003  . Degenerative disc disease    CERVICAL SPINE,W/RADICULOPATHY  . Dementia   . GERD (gastroesophageal reflux disease)   . Headache(784.0)    not migraines, but a lot of headaches  . Hyperlipidemia   . Hypertension   . Hypothyroidism   . Osteoporosis   . Premature ventricular contractions 2006  . Rib fractures   . Stricture and stenosis of esophagus 2007  . SVT (supraventricular tachycardia) ([place])    S/P ablation 2005    Past Surgical History:  Procedure Laterality Date  . ABDOMINAL HYSTERECTOMY     WITH OOPHORECTOMY,? BILATERAL FOR ENDOMETRIOSIS  . BLADDER  TACKING    . BREAST ENHANCEMENT SURGERY    . CARDIAC ELECTROPHYSIOLOGY MAPPING AND ABLATION    . CATARACT EXTRACTION Bilateral   . COLONOSCOPY    . ENDOVENOUS ABLATION SAPHENOUS VEIN W/ LASER  07-13-2011   right greater saphenous vein, Dr Mamie Nick  . ENDOVENOUS ABLATION SAPHENOUS VEIN W/ LASER  08/2011   L ; Dr Kellie Simmering  . esophageal dilation      X 1  . G2 P2    . PERMANENT PACEMAKER INSERTION N/A 04/19/2013   MDT Adapta L iimplanted by Dr Rayann Heman for mobitz II second degree AV block  . TOTAL KNEE  ARTHROPLASTY Right 04/17/2013   Procedure: RIGHT TOTAL KNEE ARTHROPLASTY, CORTISONE INJECTION LEFT KNEE;  Surgeon: Gearlean Alf, MD;  Location: [facility];  Service: Orthopedics;  Laterality: Right;    Current Outpatient Medications  Medication Sig Dispense Refill  . alum & mag hydroxide-simeth (MINTOX) 440-347-42 MG/5ML suspension Take 30 mLs by mouth every 6 (six) hours as needed for indigestion or heartburn.    Marland Kitchen amLODipine (NORVASC) 5 MG tablet Take 1 tablet (5 mg total) by mouth daily. 30 tablet 0  . busPIRone (BUSPAR) 10 MG tablet Take 15 mg by mouth 2 (two) times daily.    . Cranberry 450 MG TABS Take 1 tablet by mouth daily with breakfast.    . diphenhydrAMINE (BENADRYL) 25 MG tablet Take 12.5 mg by mouth at bedtime as needed for sleep.    . hydroxypropyl methylcellulose / hypromellose (ISOPTO TEARS / GONIOVISC) 2.5 % ophthalmic solution Place 1 drop into both eyes daily as needed for dry eyes.    Marland Kitchen levothyroxine (SYNTHROID, LEVOTHROID) 75 MCG tablet Take 37.5-75 mcg by mouth daily before breakfast. Takes 37.5 mcg every day except 75 mcg on Wednesday  2  . pantoprazole (PROTONIX) 40 MG tablet Take 40 mg by mouth daily with breakfast.     . polyethylene glycol (MIRALAX) packet Take 17 g by mouth daily. 14 each 0  . senna (SENOKOT) 8.6 MG TABS tablet Take 2 tablets (17.2 mg total) by mouth at bedtime. 120 each 0  . tolterodine (DETROL LA) 4 MG 24 hr capsule Take 4 mg by mouth daily with breakfast.   0  . traMADol (ULTRAM) 50 MG tablet Take 1 tablet (50 mg total) by mouth every 6 (six) hours as needed. 15 tablet 0   No current facility-administered medications for this visit.     Allergies as of 06/15/2017 - Review Complete 05/07/2017  Allergen Reaction Noted  . Lansoprazole Nausea Only and Other (See Comments)   . Percocet [oxycodone-acetaminophen] Nausea And Vomiting 12/24/2011  . Aricept [donepezil hcl]  05/07/2017  . Exelon [rivastigmine tartrate]  05/07/2017  . Namenda  [memantine]  05/07/2017    Family History  Problem Relation Age of Onset  . Stroke Mother 14  . Heart attack Father        in 30s  . Deep vein thrombosis Brother        AND PTE  . Diabetes Brother   . Cancer Paternal Aunt         INTESTINAL   . Alzheimer's disease Sister   . Alzheimer's disease Brother   . Tuberculosis Brother   . Aneurysm Brother        cns  . Heart Problems Unknown   . COPD Sister        smoking  . Heart attack Sister 25    Social History   Socioeconomic History  .  Marital status: Widowed    Spouse name: Not on file  . Number of children: 2  . Years of education: 12th  . Highest education level: Not on file  Social Needs  . Financial resource strain: Not on file  . Food insecurity - worry: Not on file  . Food insecurity - inability: Not on file  . Transportation needs - medical: Not on file  . Transportation needs - non-medical: Not on file  Occupational History  . Occupation: Retired    Fish farm manager: RETIRED    Comment: [employer]  Tobacco Use  . Smoking status: Never Smoker  . Smokeless tobacco: Never Used  Substance and Sexual Activity  . Alcohol use: No    Comment: wine, seldom   . Drug use: No  . Sexual activity: No  Other Topics Concern  . Not on file  Social History Narrative      Pt lives in assisted living.    Caffeine Use- Very little.    Review of Systems:   (per family members) Constitutional: No weight loss, fever or chills Skin: No rash  Cardiovascular: No chest pain Respiratory: No SOB  Gastrointestinal: See HPI and otherwise negative Genitourinary: No dysuria Neurological: No headache, dizziness or syncope Musculoskeletal: No new muscle or joint pain Hematologic: No bleeding or bruising Psychiatric: No history of depression or anxiety   Physical Exam:  Vital signs: BP 130/70   Pulse 80   Ht 5\' 1"  (1.549 m)   Wt 131 lb 9.6 oz (59.7 kg)   SpO2 96%   BMI 24.87 kg/m     Constitutional:   Pleasantly demented elderly Caucasian female appears to be in NAD, Well developed, Well nourished, alert and cooperative Head:  Normocephalic and atraumatic. Eyes:   PEERL, EOMI. No icterus. Conjunctiva pink. Ears:  Normal auditory acuity. Neck:  Supple Throat: Oral cavity and pharynx without inflammation, swelling or lesion.  Respiratory: Respirations even and unlabored. Lungs clear to auscultation bilaterally.   No wheezes, crackles, or rhonchi.  Cardiovascular: Normal S1, S2. No MRG. Regular rate and rhythm. No peripheral edema, cyanosis or pallor.  Gastrointestinal:  Soft, nondistended, mild generalized ttp, No rebound or guarding. Normal bowel sounds. No appreciable masses or hepatomegaly. Rectal:  Not performed.  Msk:  Symmetrical without gross deformities. Without edema, no deformity or joint abnormality.  Neurologic:  Alert and  oriented x2 Skin:   Dry and intact without significant lesions or rashes. Psychiatric: alzheimers dementia  RELEVANT LABS AND IMAGING: CBC    Component Value Date/Time   WBC 4.9 05/08/2017 0756   RBC 3.86 (L) 05/08/2017 0756   HGB 11.1 (L) 05/08/2017 0756   HCT 34.3 (L) 05/08/2017 0756   PLT 310 05/08/2017 0756   MCV 88.9 05/08/2017 0756   MCH 28.8 05/08/2017 0756   MCHC 32.4 05/08/2017 0756   RDW 12.8 05/08/2017 0756   LYMPHSABS 2.0 08/19/2016 1410   MONOABS 0.4 08/19/2016 1410   EOSABS 0.3 08/19/2016 1410   BASOSABS 0.0 08/19/2016 1410    CMP     Component Value Date/Time   NA 139 05/08/2017 0756   K 3.1 (L) 05/08/2017 0756   CL 107 05/08/2017 0756   CO2 25 05/08/2017 0756   GLUCOSE 96 05/08/2017 0756   BUN 10 05/08/2017 0756   CREATININE 0.55 05/08/2017 0756   CALCIUM 8.9 05/08/2017 0756   PROT 6.3 (L) 05/07/2017 1212   ALBUMIN 3.6 05/07/2017 1212   AST 24 05/07/2017 1212   ALT  14 05/07/2017 1212   ALKPHOS 48 05/07/2017 1212   BILITOT 0.6 05/07/2017 1212   GFRNONAA >60 05/08/2017 0756   GFRAA >60 05/08/2017  0756    Assessment: 1.  Nausea and vomiting: Patient is somewhat nauseous throughout the day with "spitting up material with black flecks in it" per the family and then will have episodes of vomiting every 2-3 days as below; consider relation to gastritis versus other 2. H/o Coffee ground emesis: Increasing amount and frequency over the past 2 months, occurs 2-3 days/week per family; consider gastritis versus PUD versus other 3.  Anemia: Hemoglobin 11.1 during recent admission December for history of above  Plan: 1.  Recheck CBC today 2.  Schedule patient for an EGD and LEC with Dr. Carlean Purl.  Did discuss risks, benefits, limitations and alternatives and the patient agrees to proceed. 3.  Increase patient's Pantoprazole to 40 mg twice daily, 30-60 minutes before eating breakfast and dinner 4.  Patient to follow in clinic per recommendations from Dr. Carlean Purl after time of procedure.  Ellouise Newer, PA-C [facility] 06/15/2017, 8:50 AM  Cc: [name], MD   Agree with Ms. Mort Sawyers evaluation and management.  Gatha Mayer, MD, [facility]

## 2017-06-16 ENCOUNTER — Encounter: Payer: Self-pay | Admitting: Internal Medicine

## 2017-06-16 ENCOUNTER — Ambulatory Visit (AMBULATORY_SURGERY_CENTER): Payer: Medicare Other | Admitting: Internal Medicine

## 2017-06-16 ENCOUNTER — Other Ambulatory Visit: Payer: Self-pay

## 2017-06-16 VITALS — BP 150/70 | HR 63 | Temp 98.9°F | Resp 16 | Ht 61.0 in | Wt 131.0 lb

## 2017-06-16 DIAGNOSIS — K2100 Gastro-esophageal reflux disease with esophagitis, without bleeding: Secondary | ICD-10-CM

## 2017-06-16 DIAGNOSIS — K920 Hematemesis: Secondary | ICD-10-CM | POA: Diagnosis present

## 2017-06-16 MED ORDER — SUCRALFATE 1 G PO TABS: 1.0000 g | ORAL_TABLET | Freq: Three times a day (TID) | ORAL | 11 refills | Status: DC

## 2017-06-16 MED ORDER — SODIUM CHLORIDE 0.9 % IV SOLN: 500.0000 mL | Freq: Once | INTRAVENOUS | Status: DC

## 2017-06-16 NOTE — Progress Notes (Signed)
Pt's states no medical or surgical changes since previsit or office visit.Pt's states no medical or surgical changes since previsit or office visit.Pt's states no medical or surgical changes since previsit or office visit.Patient consents to observer being present for procedure.

## 2017-06-16 NOTE — Progress Notes (Signed)
Report given to PACU, vss 

## 2017-06-16 NOTE — Patient Instructions (Addendum)
    The esophagus is inflamed - reflux esophagitis causing this.  Need to be upright more and am adding a protective coating medication called sucralfate.  Continue your present medications.  Follow an antireflux regimen.  This includes:      - Do not lie down for at least 3 to 4 hours after meals.       - Raise the head of the bed 4 to 6 inches.  Sit up as much as possible     - Decrease excess weight.       - Avoid citrus juices and other acidic foods, alcohol, chocolate, mints, coffee and other caffeinated beverages, carbonated beverages, fatty and fried foods.       - Avoid tight-fitting clothing.       - Avoid cigarettes and other tobacco products.  Take Carafate (sucralfate) tablets 1 gram by mouth 4 times  a day.      I appreciate the opportunity to care for you. Gatha Mayer, MD, [facility]

## 2017-06-16 NOTE — Op Note (Addendum)
[facility] Patient Name: Shelly Ross Procedure Date: 06/16/2017 10:04 AM MRN: [id number] Endoscopist: Gatha Mayer , MD Age: 84 Referring MD:  Date of Birth: [birth year] Gender: Female Account #: [id number] Procedure:                Upper GI endoscopy Indications:              Coffee-ground emesis, Hematemesis Medicines:                Propofol per Anesthesia, Monitored Anesthesia Care Procedure:                Pre-Anesthesia Assessment:                           - Prior to the procedure, a History and Physical                            was performed, and patient medications and                            allergies were reviewed. The patient's tolerance of                            previous anesthesia was also reviewed. The risks                            and benefits of the procedure and the sedation                            options and risks were discussed with the patient.                            All questions were answered, and informed consent                            was obtained. Prior Anticoagulants: The patient has                            taken no previous anticoagulant or antiplatelet                            agents. ASA Grade Assessment: III - A patient with                            severe systemic disease. After reviewing the risks                            and benefits, the patient was deemed in                            satisfactory condition to undergo the procedure.                           After obtaining informed consent, the endoscope was  passed under direct vision. Throughout the                            procedure, the patient's blood pressure, pulse, and                            oxygen saturations were monitored continuously. The                            Endoscope was introduced through the mouth, and                            advanced to the second part of duodenum. The upper          GI endoscopy was accomplished without difficulty.                            The patient tolerated the procedure well. Scope In: Scope Out: Findings:                 LA Grade C (one or more mucosal breaks continuous                            between tops of 2 or more mucosal folds, less than                            75% circumference) esophagitis was found in the                            lower third of the esophagus.                           A 6 cm hiatal hernia was present.                           The exam was otherwise without abnormality.                           The cardia and gastric fundus were normal on                            retroflexion. Complications:            No immediate complications. Estimated Blood Loss:     Estimated blood loss: none. Impression:               - LA Grade C reflux esophagitis.                           - 6 cm hiatal hernia.                           - The examination was otherwise normal.                           - No specimens collected. Recommendation:           -  Patient has a contact number available for                            emergencies. The signs and symptoms of potential                            delayed complications were discussed with the                            patient. Return to normal activities tomorrow.                            Written discharge instructions were provided to the                            patient.                           - Resume previous diet.                           - Continue present medications.                           - Follow an antireflux regimen.                           - Use sucralfate tablets 1 gram PO QID.                           - She [facility] [place]                            scheduling appt Gatha Mayer, MD 06/16/2017 10:29:22 AM This report has been signed electronically.

## 2017-06-17 ENCOUNTER — Telehealth: Payer: Self-pay | Admitting: Internal Medicine

## 2017-06-17 ENCOUNTER — Telehealth: Payer: Self-pay

## 2017-06-17 NOTE — Telephone Encounter (Signed)
Lattie Haw notified that patient does not need CT.

## 2017-06-17 NOTE — Telephone Encounter (Signed)
Dr. Carlean Purl does this patient need a CT?   Nothing on the endo report.  Please Arnoldo Hooker

## 2017-06-17 NOTE — Telephone Encounter (Signed)
Not her

## 2017-06-17 NOTE — Telephone Encounter (Signed)
  Follow up Call-  Call back number 06/16/2017  Post procedure Call Back phone  # [phone] - nurses station at [place] to leave phone message Yes  Some recent data might be hidden     Patient questions:  Do you have a fever, pain , or abdominal swelling? No. Pain Score  0 *  Have you tolerated food without any problems? Yes.    Have you been able to return to your normal activities? Yes.    Do you have any questions about your discharge instructions: Diet   No. Medications  No. Follow up visit  No.  Do you have questions or concerns about your Care? No.  Actions: * If pain score is 4 or above: No action needed, pain <4.

## 2017-08-03 ENCOUNTER — Telehealth: Payer: Self-pay | Admitting: Internal Medicine

## 2017-08-03 NOTE — Telephone Encounter (Signed)
I spoke with Shelly Ross and told him that I spoke with the nurse Lattie Haw at [facility] unit [phone] and she said for him to bring the [employer] letter he got regarding the coverage for carafate. Lattie Haw said she will have their pharmacy to look at it and see what they can do to get it covered. Shelly Ross goes often to see his Mom so they will get the letter soon.

## 2017-08-04 ENCOUNTER — Ambulatory Visit (INDEPENDENT_AMBULATORY_CARE_PROVIDER_SITE_OTHER): Payer: Medicare Other | Admitting: Podiatry

## 2017-08-04 ENCOUNTER — Encounter: Payer: Self-pay | Admitting: Podiatry

## 2017-08-04 DIAGNOSIS — M79676 Pain in unspecified toe(s): Secondary | ICD-10-CM

## 2017-08-04 DIAGNOSIS — B351 Tinea unguium: Secondary | ICD-10-CM

## 2017-08-04 DIAGNOSIS — L989 Disorder of the skin and subcutaneous tissue, unspecified: Secondary | ICD-10-CM

## 2017-08-07 NOTE — Progress Notes (Signed)
    Subjective: Patient is a 84 y.o. female presenting to the office today with a chief complaint of a painful callus lesion to the 5th toes of bilateral feet that have been present for several weeks. Applying pressure to the areas increases the pain. She has not done anything to treat the symptoms.  Patient also complains of elongated, thickened nails that cause pain while ambulating in shoes. She is unable to trim her own nails. Patient presents today for further treatment and evaluation.  Past Medical History:  Diagnosis Date  . Adenomatous colon polyp 2003  . Degenerative disc disease    CERVICAL SPINE,W/RADICULOPATHY  . Dementia   . GERD (gastroesophageal reflux disease)   . Headache(784.0)    not migraines, but a lot of headaches  . Hyperlipidemia   . Hypertension   . Hypothyroidism   . Osteoporosis   . Premature ventricular contractions 2006  . Rib fractures   . Stricture and stenosis of esophagus 2007  . SVT (supraventricular tachycardia) ([place])    S/P ablation 2005    Objective:  Physical Exam General: Alert and oriented x3 in no acute distress  Dermatology: Hyperkeratotic lesion present on the bilateral 5th toes. Pain on palpation with a central nucleated core noted. Skin is warm, dry and supple bilateral lower extremities. Negative for open lesions or macerations. Nails are tender, long, thickened and dystrophic with subungual debris, consistent with onychomycosis, 1-5 bilateral. No signs of infection noted.  Vascular: Palpable pedal pulses bilaterally. No edema or erythema noted. Capillary refill within normal limits.  Neurological: Epicritic and protective threshold grossly intact bilaterally.   Musculoskeletal Exam: Pain on palpation at the keratotic lesion noted. Range of motion within normal limits bilateral. Muscle strength 5/5 in all groups bilateral.  Assessment: 1. Onychodystrophic nails 1-5 bilateral with hyperkeratosis of nails.  2. Onychomycosis of nail  due to dermatophyte bilateral 3. Porokeratosis to the bilateral 5th toes   Plan of Care:  #1 Patient evaluated. #2 Excisional debridement of keratoic lesion using a chisel blade was performed without incident.  #3 Dressed with light dressing. #4 Mechanical debridement of nails 1-5 bilaterally performed using a nail nipper. Filed with dremel without incident.  #5 Patient is to return to the clinic in 3 months.   Edrick Kins, DPM [facility]  Dr. Edrick Kins, [place]                                        [place], [place] [address]                Office [phone]  Fax [phone]

## 2017-08-12 ENCOUNTER — Observation Stay (HOSPITAL_COMMUNITY)
Admission: EM | Admit: 2017-08-12 | Discharge: 2017-08-13 | Disposition: A | Discharge status: Home / Self Care | Payer: Medicare Other | Attending: Internal Medicine | Admitting: Internal Medicine

## 2017-08-12 ENCOUNTER — Emergency Department (HOSPITAL_COMMUNITY): Payer: Medicare Other

## 2017-08-12 ENCOUNTER — Other Ambulatory Visit: Payer: Self-pay

## 2017-08-12 ENCOUNTER — Encounter (HOSPITAL_COMMUNITY): Payer: Self-pay

## 2017-08-12 DIAGNOSIS — E039 Hypothyroidism, unspecified: Secondary | ICD-10-CM | POA: Diagnosis not present

## 2017-08-12 DIAGNOSIS — K922 Gastrointestinal hemorrhage, unspecified: Secondary | ICD-10-CM | POA: Diagnosis not present

## 2017-08-12 DIAGNOSIS — Z79899 Other long term (current) drug therapy: Secondary | ICD-10-CM | POA: Insufficient documentation

## 2017-08-12 DIAGNOSIS — E876 Hypokalemia: Secondary | ICD-10-CM | POA: Insufficient documentation

## 2017-08-12 DIAGNOSIS — I10 Essential (primary) hypertension: Secondary | ICD-10-CM | POA: Insufficient documentation

## 2017-08-12 DIAGNOSIS — Z950 Presence of cardiac pacemaker: Secondary | ICD-10-CM | POA: Diagnosis not present

## 2017-08-12 DIAGNOSIS — F0390 Unspecified dementia without behavioral disturbance: Secondary | ICD-10-CM | POA: Insufficient documentation

## 2017-08-12 DIAGNOSIS — K920 Hematemesis: Secondary | ICD-10-CM | POA: Diagnosis not present

## 2017-08-12 LAB — CBC
HEMATOCRIT: 27.4 % — AB (ref 36.0–46.0)
HEMOGLOBIN: 8.1 g/dL — AB (ref 12.0–15.0)
MCH: 21.3 pg — ABNORMAL LOW (ref 26.0–34.0)
MCHC: 29.6 g/dL — ABNORMAL LOW (ref 30.0–36.0)
MCV: 72.1 fL — AB (ref 78.0–100.0)
Platelets: 423 10*3/uL — ABNORMAL HIGH (ref 150–400)
RBC: 3.8 MIL/uL — AB (ref 3.87–5.11)
RDW: 16.5 % — ABNORMAL HIGH (ref 11.5–15.5)
WBC: 7.3 10*3/uL (ref 4.0–10.5)

## 2017-08-12 LAB — COMPREHENSIVE METABOLIC PANEL
ALT: 20 U/L (ref 14–54)
ANION GAP: 8 (ref 5–15)
AST: 22 U/L (ref 15–41)
Albumin: 3.4 g/dL — ABNORMAL LOW (ref 3.5–5.0)
Alkaline Phosphatase: 64 U/L (ref 38–126)
BUN: 21 mg/dL — ABNORMAL HIGH (ref 6–20)
CHLORIDE: 103 mmol/L (ref 101–111)
CO2: 29 mmol/L (ref 22–32)
Calcium: 9.2 mg/dL (ref 8.9–10.3)
Creatinine, Ser: 0.94 mg/dL (ref 0.44–1.00)
GFR, EST NON AFRICAN AMERICAN: 52 mL/min — AB (ref >60)
Glucose, Bld: 122 mg/dL — ABNORMAL HIGH (ref 65–99)
POTASSIUM: 2.9 mmol/L — AB (ref 3.5–5.1)
Sodium: 140 mmol/L (ref 135–145)
Total Bilirubin: 0.4 mg/dL (ref 0.3–1.2)
Total Protein: 6.1 g/dL — ABNORMAL LOW (ref 6.5–8.1)

## 2017-08-12 LAB — POC OCCULT BLOOD, ED: FECAL OCCULT BLD: NEGATIVE

## 2017-08-12 LAB — TYPE AND SCREEN
ABO/RH(D): O POS
Antibody Screen: NEGATIVE

## 2017-08-12 LAB — STREP PNEUMONIAE URINARY ANTIGEN: STREP PNEUMO URINARY ANTIGEN: NEGATIVE

## 2017-08-12 MED ORDER — SUCRALFATE 1 G PO TABS
1.0000 g | ORAL_TABLET | Freq: Three times a day (TID) | ORAL | Status: DC
  Administered 2017-08-12 – 2017-08-13 (×3): 1 g via ORAL
  Filled 2017-08-12 (×3): qty 1

## 2017-08-12 MED ORDER — TRAMADOL HCL 50 MG PO TABS: 50.0000 mg | ORAL_TABLET | Freq: Four times a day (QID) | ORAL | Status: DC | PRN

## 2017-08-12 MED ORDER — VANCOMYCIN HCL IN DEXTROSE 1-5 GM/200ML-% IV SOLN
1000.0000 mg | Freq: Once | INTRAVENOUS | Status: AC
  Administered 2017-08-12: 1000 mg via INTRAVENOUS
  Filled 2017-08-12: qty 200

## 2017-08-12 MED ORDER — VANCOMYCIN HCL IN DEXTROSE 1-5 GM/200ML-% IV SOLN: 1000.0000 mg | INTRAVENOUS | Status: DC

## 2017-08-12 MED ORDER — POTASSIUM CHLORIDE CRYS ER 20 MEQ PO TBCR
40.0000 meq | EXTENDED_RELEASE_TABLET | Freq: Once | ORAL | Status: AC
  Administered 2017-08-12: 40 meq via ORAL
  Filled 2017-08-12: qty 2

## 2017-08-12 MED ORDER — BUSPIRONE HCL 5 MG PO TABS
15.0000 mg | ORAL_TABLET | Freq: Two times a day (BID) | ORAL | Status: DC
  Administered 2017-08-12 – 2017-08-13 (×2): 15 mg via ORAL
  Filled 2017-08-12 (×2): qty 1

## 2017-08-12 MED ORDER — SODIUM CHLORIDE 0.9 % IV SOLN
2.0000 g | Freq: Once | INTRAVENOUS | Status: AC
  Administered 2017-08-12: 2 g via INTRAVENOUS
  Filled 2017-08-12: qty 2

## 2017-08-12 MED ORDER — CEFEPIME HCL 1 G IJ SOLR: 1.0000 g | INTRAMUSCULAR | Status: DC

## 2017-08-12 MED ORDER — PANTOPRAZOLE SODIUM 40 MG IV SOLR
40.0000 mg | INTRAVENOUS | Status: DC
  Filled 2017-08-12: qty 40

## 2017-08-12 MED ORDER — POTASSIUM CHLORIDE 2 MEQ/ML IV SOLN: INTRAVENOUS | Status: DC

## 2017-08-12 MED ORDER — LEVOTHYROXINE SODIUM 75 MCG PO TABS: 75.0000 ug | ORAL_TABLET | ORAL | Status: DC

## 2017-08-12 MED ORDER — KCL IN DEXTROSE-NACL 40-5-0.45 MEQ/L-%-% IV SOLN
INTRAVENOUS | Status: DC
  Administered 2017-08-12 (×2): via INTRAVENOUS
  Filled 2017-08-12 (×3): qty 1000

## 2017-08-12 MED ORDER — FESOTERODINE FUMARATE ER 4 MG PO TB24
4.0000 mg | ORAL_TABLET | Freq: Every day | ORAL | Status: DC
  Administered 2017-08-13: 4 mg via ORAL
  Filled 2017-08-12: qty 1

## 2017-08-12 MED ORDER — LEVOTHYROXINE SODIUM 75 MCG PO TABS
37.5000 ug | ORAL_TABLET | ORAL | Status: DC
  Administered 2017-08-13: 37.5 ug via ORAL
  Filled 2017-08-12: qty 1

## 2017-08-12 MED ORDER — PANTOPRAZOLE SODIUM 40 MG PO TBEC
40.0000 mg | DELAYED_RELEASE_TABLET | Freq: Two times a day (BID) | ORAL | Status: DC
  Administered 2017-08-12 – 2017-08-13 (×2): 40 mg via ORAL
  Filled 2017-08-12 (×3): qty 1

## 2017-08-12 NOTE — H&P (Signed)
History and Physical    Shelly Ross SWN:[id number] DOB: 18-May-1925 DOA: 08/12/2017  PCP: [name], MD  Patient coming from: [facility].   I have personally briefly reviewed patient's old medical records in [place]  Chief Complaint: coffee ground emesis since one week.   HPI: Shelly Ross is a 84 y.o. female with medical history significant of hypertension, hyperlipidemia, hypothyroidism, dementia, GERD, History of Mobitz type II second-degree heart block requiring pacemaker implantation in 2014, hiatal hernia, was brought in for persistent coffee ground emesis since one week, happening only at night. Pt has dementia, and detailed ROS couldn't be obtained, pt denies any chest pain or abdominal pain. Pt;s son and daughter at bedside, gave most of the history. They reports, she has been spitting up blood and occasionally coffee ground looking spit in small amounts every day. She does not appear to be in any distress. No other complaints. She was admitted in December for similar complaints and discharged to follow up with GI as outpatient. Her hemoglobin today is 8.1 and has dropped from 10. Lab work in ED, revealed potassium of 2.9 and hemoglobin of 8.2 and platelets of 423. Fecal for occult blood is negative. cxr shows left lung opacity, unclear if its pneumonia or atelectasis. She is afebrile and her vs wnl.  She is referred to medical service for admission .       Review of Systems: pt has dementia, detailed ROS couldn't be obtained.   Past Medical History:  Diagnosis Date  . Adenomatous colon polyp 2003  . Degenerative disc disease    CERVICAL SPINE,W/RADICULOPATHY  . Dementia   . GERD (gastroesophageal reflux disease)   . Headache(784.0)    not migraines, but a lot of headaches  . Hyperlipidemia   . Hypertension   . Hypothyroidism   . Osteoporosis   . Premature ventricular contractions 2006  . Rib fractures   . Stricture and stenosis of  esophagus 2007  . SVT (supraventricular tachycardia) ([place])    S/P ablation 2005    Past Surgical History:  Procedure Laterality Date  . ABDOMINAL HYSTERECTOMY     WITH OOPHORECTOMY,? BILATERAL FOR ENDOMETRIOSIS  . BLADDER TACKING    . BREAST ENHANCEMENT SURGERY    . CARDIAC ELECTROPHYSIOLOGY MAPPING AND ABLATION    . CATARACT EXTRACTION Bilateral   . COLONOSCOPY    . ENDOVENOUS ABLATION SAPHENOUS VEIN W/ LASER  07-13-2011   right greater saphenous vein, Dr Mamie Nick  . ENDOVENOUS ABLATION SAPHENOUS VEIN W/ LASER  08/2011   L ; Dr Kellie Simmering  . esophageal dilation      X 1  . G2 P2    . PERMANENT PACEMAKER INSERTION N/A 04/19/2013   MDT Adapta L iimplanted by Dr Rayann Heman for mobitz II second degree AV block  . TOTAL KNEE ARTHROPLASTY Right 04/17/2013   Procedure: RIGHT TOTAL KNEE ARTHROPLASTY, CORTISONE INJECTION LEFT KNEE;  Surgeon: Gearlean Alf, MD;  Location: [facility];  Service: Orthopedics;  Laterality: Right;     reports that  has never smoked. she has never used smokeless tobacco. She reports that she does not drink alcohol or use drugs.  Allergies  Allergen Reactions  . Lansoprazole Nausea Only and Other (See Comments)    Reaction:  Headaches   . Percocet [Oxycodone-Acetaminophen] Nausea And Vomiting  . Aricept [Donepezil Hcl]     Unknown reaction per MAR   . Exelon [Rivastigmine Tartrate]     Unknown reaction per MAR   .  Namenda [Memantine]     Unknown reaction per [facility]     Family History  Problem Relation Age of Onset  . Stroke Mother 7  . Heart attack Father        in 79s  . Deep vein thrombosis Brother        AND PTE  . Diabetes Brother   . Cancer Paternal Aunt         INTESTINAL   . Alzheimer's disease Sister   . Alzheimer's disease Brother   . Tuberculosis Brother   . Aneurysm Brother        cns  . Heart Problems Unknown   . COPD Sister        smoking  . Heart attack Sister 27   Family history reviewed.   Prior to Admission medications     Medication Sig Start Date End Date Taking? Authorizing Provider  acetaminophen (TYLENOL) 650 MG CR tablet acetaminophen ER 650 mg tablet,extended release  Take 2 tablets every 8 hours by oral route.   Yes [provider]  amLODipine (NORVASC) 5 MG tablet Take 1 tablet (5 mg total) by mouth daily. 05/08/17  Yes Hosie Poisson, MD  busPIRone (BUSPAR) 5 MG tablet Take 15 mg by mouth 2 (two) times daily.   Yes [provider]  Cranberry 450 MG TABS Take 1 tablet by mouth daily with breakfast.   Yes [provider]  levothyroxine (SYNTHROID, LEVOTHROID) 75 MCG tablet Take 37.5-75 mcg by mouth daily before breakfast. Takes 37.5 mcg every day except 75 mcg on Wednesday   Yes [provider]  pantoprazole (PROTONIX) 40 MG tablet Take 1 tablet (40 mg total) by mouth 2 (two) times daily before a meal. 06/15/17  Yes Lemmon, Lavone Nian, PA  polyethylene glycol [facility]) packet Take 17 g by mouth daily. 05/08/17  Yes Ghimire, Henreitta Leber, MD  senna (SENOKOT) 8.6 MG TABS tablet Take 2 tablets (17.2 mg total) by mouth at bedtime. 05/08/17  Yes Ghimire, Henreitta Leber, MD  sucralfate (CARAFATE) 1 g tablet Take 1 tablet (1 g total) by mouth 4 (four) times daily -  with meals and at bedtime. 06/16/17  Yes Gatha Mayer, MD  tolterodine (DETROL LA) 4 MG 24 hr capsule Take 4 mg by mouth daily with breakfast.    Yes [provider]  traMADol (ULTRAM) 50 MG tablet Take 1 tablet (50 mg total) by mouth every 6 (six) hours as needed. 05/08/17  Yes Ghimire, Henreitta Leber, MD    Physical Exam: Vitals:   08/12/17 1228  BP: (!) 148/68  Pulse: 73  Resp: 16  Temp: 98 F (36.7 C)  TempSrc: Oral  SpO2: 100%    Constitutional: NAD, calm, comfortable Vitals:   08/12/17 1228  BP: (!) 148/68  Pulse: 73  Resp: 16  Temp: 98 F (36.7 C)  TempSrc: Oral  SpO2: 100%   Eyes: PERRL, lids and conjunctivae normal ENMT: Mucous membranes are moist. Posterior pharynx clear of any exudate or  lesions.Normal dentition.  Neck: normal, supple, no masses, no thyromegaly Respiratory: clear to auscultation bilaterally, no wheezing, no crackles. Normal respiratory effort. No accessory muscle use.  Cardiovascular: Regular rate and rhythm, no murmurs / rubs / gallops. No extremity edema. 2+ pedal pulses. No carotid bruits.  Abdomen: no tenderness, no masses palpated. No hepatosplenomegaly. Bowel sounds positive.  Musculoskeletal: no clubbing / cyanosis. No joint deformity upper and lower extremities.   Skin: no rashes, lesions, ulcers. No induration Neurologic: CN 2-12 grossly intact.  Able to move all extremities, alert but confused.  Psychiatric: normal mood.   Labs on Admission: I have personally reviewed following labs and imaging studies  CBC: Recent Labs  Lab 08/12/17 1232  WBC 7.3  HGB 8.1*  HCT 27.4*  MCV 72.1*  PLT 732*   Basic Metabolic Panel: Recent Labs  Lab 08/12/17 1232  NA 140  K 2.9*  CL 103  CO2 29  GLUCOSE 122*  BUN 21*  CREATININE 0.94  CALCIUM 9.2   GFR: CrCl cannot be calculated (Unknown ideal weight.). Liver Function Tests: Recent Labs  Lab 08/12/17 1232  AST 22  ALT 20  ALKPHOS 64  BILITOT 0.4  PROT 6.1*  ALBUMIN 3.4*   No results for input(s): LIPASE, AMYLASE in the last 168 hours. No results for input(s): AMMONIA in the last 168 hours. Coagulation Profile: No results for input(s): INR, PROTIME in the last 168 hours. Cardiac Enzymes: No results for input(s): CKTOTAL, CKMB, CKMBINDEX, TROPONINI in the last 168 hours. BNP (last 3 results) No results for input(s): PROBNP in the last 8760 hours. HbA1C: No results for input(s): HGBA1C in the last 72 hours. CBG: No results for input(s): GLUCAP in the last 168 hours. Lipid Profile: No results for input(s): CHOL, HDL, LDLCALC, TRIG, CHOLHDL, LDLDIRECT in the last 72 hours. Thyroid Function Tests: No results for input(s): TSH, T4TOTAL, FREET4, T3FREE, THYROIDAB in the last 72  hours. Anemia Panel: No results for input(s): VITAMINB12, FOLATE, FERRITIN, TIBC, IRON, RETICCTPCT in the last 72 hours. Urine analysis:    Component Value Date/Time   COLORURINE STRAW (A) 05/07/2017 1418   APPEARANCEUR CLEAR 05/07/2017 1418   LABSPEC 1.008 05/07/2017 1418   PHURINE 7.0 05/07/2017 1418   GLUCOSEU NEGATIVE 05/07/2017 1418   GLUCOSEU NEGATIVE 02/02/2013 1500   HGBUR NEGATIVE 05/07/2017 1418   HGBUR negative 07/09/2009 1101   BILIRUBINUR NEGATIVE 05/07/2017 1418   BILIRUBINUR Neg 10/06/2012 0927   KETONESUR NEGATIVE 05/07/2017 1418   PROTEINUR NEGATIVE 05/07/2017 1418   UROBILINOGEN 0.2 02/28/2015 2038   NITRITE NEGATIVE 05/07/2017 1418   LEUKOCYTESUR NEGATIVE 05/07/2017 1418    Radiological Exams on Admission: Dg Chest 2 View  Result Date: 08/12/2017 CLINICAL DATA:  Patient has been throwing up bright red blood since last night, prior to that, she was vomiting dark blood. Hx of dementia, GERD, hypertension, SVT. EXAM: CHEST - 2 VIEW COMPARISON:  05/07/2017 FINDINGS: Cardiac silhouette is normal in size and configuration. No mediastinal or hilar masses. No evidence adenopathy. There is hazy lung base opacity, most prominent on the left, which projects in the lower lobes on the lateral view. This may be due to atelectasis or infection. There is no evidence of pulmonary edema. Remainder of the lungs is clear. No pleural effusion or pneumothorax. Left anterior chest wall sequential pacemaker is stable. Skeletal structures are demineralized but grossly intact. IMPRESSION: 1. Hazy lung base opacity, most evident on the left, which may reflect pneumonia or be due to atelectasis. No evidence of pulmonary edema. Electronically Signed   By: Lajean Manes M.D.   On: 08/12/2017 13:07      Assessment/Plan Active Problems:   Coffee ground emesis   Coffee ground emesis:  ? GI bleed vs left sided pneumonia.  Admit for IV antibiotics, blood cultures, urine for strep antigen  ordered.  Pt on RA with good sats.  GI consulted by EDP for evaluation of upper GI bleed/ regurgitatio IV PPI.  Transfuse to keep hemoglobin greater than 7.  Clear liquid diet.  SLP evaluation.     Hypertension:  Well controlled.    Hypothyroidism:  Resume synthroid.     Acute anemia of blood loss  Hemoglobin around 8 this admission.  Anemia panel ordered.  Transfuse to keep hemoglobin greater than 7.   Hypokalemia: replace as needed. Get magnesium level .       DVT prophylaxis: scd's Code Status:  Full code.  Family Communication: son and daughter at bedside.  Disposition Plan: pending evaluation by GI  Consults called: Gastroenterology.  Admission status: obs.    Hosie Poisson MD [facility] Pager [phone]  If 7PM-7AM, please contact night-coverage [email or web address] Password [employer] [facility]  08/12/2017, 4:53 PM

## 2017-08-12 NOTE — ED Notes (Signed)
Bed: WA03 Expected date:  Expected time:  Means of arrival:  Comments: EMS-coughing up blood

## 2017-08-12 NOTE — Clinical Social Work Note (Signed)
Clinical Social Work Assessment  Patient Details  Name: Shelly Ross MRN: [id number] Date of Birth: 08/29/1924  Date of referral:  08/12/17               Reason for consult:  Discharge Planning                Permission sought to share information with:  Facility Sport and exercise psychologist, Family Supports, Case Optician, dispensing granted to share information::  Yes, Verbal Permission Granted  Name::        Agency::  [facility] (memory care care unit)  Relationship::  Daughter  Contact Information:     Housing/Transportation Living arrangements for the past 2 months:  [place] of Information:  Medical Team, Facility, Case Manager Patient Interpreter Needed:  None Criminal Activity/Legal Involvement Pertinent to Current Situation/Hospitalization:  No - Comment as needed Significant Relationships:  Adult Children, Warehouse manager Lives with:  Self, Facility Resident Do you feel safe going back to the place where you live?  Yes Need for family participation in patient care:  Yes (Comment)(memory care issues)  Care giving concerns:  Patient arrives from [facility] unit at [facility]. Patient presents to [facility] ED after having coughing episode resulting in bright red blood. Per facility patient has been coughing dark blood after eating.  Patient will be admitted for future evaluation and work up.  No concerns with care giving at this time.    Social Worker assessment / plan:  LCSW completed assessment and spoke with Quincy Carnes regarding admission.  Patient is from ALF and has been a long term care resident in memory care facility. Patient is able to return back to ALF unless higher level of care is warranted and then she will require authorization for SNF.  LCSW will alert unit CSW regarding admission and follow throughout hospital course.  Facility is aware of her admission and also agreeable for return once medically stable. Will update Fl2 once appropriate  level of care is known and acute issues resolved.   Employment status:  Retired Nurse, adult PT Recommendations:  Not assessed at this time Information / Referral to community resources:  Other (Comment Required)(Still assessing, patient currently in ED)  Patient/Family's Response to care:  No family at bedside. Facility is understanding of reason for admission and caregiving  Patient/Family's Understanding of and Emotional Response to Diagnosis, Current Treatment, and Prognosis:  Will continue to assess and assist with needs related to current treatment plan. Call placed to daughter, unable to reach. EMS brought patient to hospital.   Emotional Assessment Appearance:  Appears stated age Attitude/Demeanor/Rapport:  Lethargic Affect (typically observed):  Anxious Orientation:  Oriented to Self Alcohol / Substance use:  Not Applicable Psych involvement (Current and /or in the community):  No (Comment)  Discharge Needs  Concerns to be addressed:  Care Coordination Readmission within the last 30 days:  No Current discharge risk:  None Barriers to Discharge:  Continued Medical Work up(Currently being admitted)   [place], LCSW 08/12/2017, 3:03 PM

## 2017-08-12 NOTE — ED Triage Notes (Signed)
Pt arrived via EMS [facility] ( Memory care Unit). Per Facility staff pt has been spitting/coughing up dark red blood x 2 months post eating and has been being f/u by NP at facility for  GERD. Pt presents today and has been coughing/spitting up bright red blood today prior to eating, Pt is a poor historian. Pt is alert and orinted x 1(self) .  Pt did have some tenderness noted on Rt Upper Quad   IV 18G left AC V/S EMS: 158/95 he 91 RR 20 O2 99% CBG 147  Pt has DNR

## 2017-08-12 NOTE — ED Notes (Signed)
ED TO INPATIENT HANDOFF REPORT  Name/Age/Gender Shelly Ross 84 y.o. female  Code Status Code Status History    Date Active Date Inactive Code Status Order ID Comments User Context   05/07/2017 16:36 05/08/2017 21:32 DNR [id number]  Hosie Poisson, MD Inpatient   07/20/2015 14:16 07/23/2015 21:37 Full Code [id number]  Samella Parr, NP ED   05/24/2014 20:59 05/28/2014 18:07 Full Code [id number]  Lisette Abu, PA-C Inpatient   04/19/2013 15:35 04/22/2013 17:43 Full Code [id number]  Thompson Grayer, MD Inpatient   04/17/2013 11:58 04/19/2013 15:35 Full Code [id number]  Gearlean Alf, MD Inpatient    Questions for Most Recent Historical Code Status (Order [id number])    Question Answer Comment   In the event of cardiac or respiratory ARREST Do not call a "code blue"    In the event of cardiac or respiratory ARREST Do not perform Intubation, CPR, defibrillation or ACLS    In the event of cardiac or respiratory ARREST Use medication by any route, position, wound care, and other measures to relive pain and suffering. May use oxygen, suction and manual treatment of airway obstruction as needed for comfort.         Advance Directive Documentation     Most Recent Value  Type of Advance Directive  Out of facility DNR (pink MOST or yellow form)  Pre-existing out of facility DNR order (yellow form or pink MOST form)  Yellow form placed in chart (order not valid for inpatient use)  "MOST" Form in Place?  No data      Home/SNF/Other Skilled nursing facility  Chief Complaint emesis   Level of Care/Admitting Diagnosis ED Disposition    ED Disposition Condition Comment   Admit  Hospital Area: [place] [[id number]]  Level of Care: Med-Surg [16]  Diagnosis: Coffee ground emesis [169450]  Admitting Physician: Hosie Poisson [[id number]]  Attending Physician: Hosie Poisson [[id number]]  PT Class (Do Not Modify): Observation [104]  PT Acc Code (Do Not Modify): Observation [10022]        Medical History Past Medical History:  Diagnosis Date  . Adenomatous colon polyp 2003  . Degenerative disc disease    CERVICAL SPINE,W/RADICULOPATHY  . Dementia   . GERD (gastroesophageal reflux disease)   . Headache(784.0)    not migraines, but a lot of headaches  . Hyperlipidemia   . Hypertension   . Hypothyroidism   . Osteoporosis   . Premature ventricular contractions 2006  . Rib fractures   . Stricture and stenosis of esophagus 2007  . SVT (supraventricular tachycardia) ([place])    S/P ablation 2005    Allergies Allergies  Allergen Reactions  . Lansoprazole Nausea Only and Other (See Comments)    Reaction:  Headaches   . Percocet [Oxycodone-Acetaminophen] Nausea And Vomiting  . Aricept [Donepezil Hcl]     Unknown reaction per MAR   . Exelon [Rivastigmine Tartrate]     Unknown reaction per MAR   . Namenda [Memantine]     Unknown reaction per [facility]     IV Location/Drains/Wounds Patient Lines/Drains/Airways Status   Active Line/Drains/Airways    Name:   Placement date:   Placement time:   Site:   Days:   Peripheral IV 08/12/17 Left Antecubital   08/12/17    1204    Antecubital   less than 1   Incision 04/17/13 Knee Right   04/17/13    0756     1578   Incision 04/19/13 Chest Left;Upper   04/19/13  1545     1576          Labs/Imaging Results for orders placed or performed during the hospital encounter of 08/12/17 (from the past 48 hour(s))  Comprehensive metabolic panel     Status: Abnormal   Collection Time: 08/12/17 12:32 PM  Result Value Ref Range   Sodium 140 135 - 145 mmol/L   Potassium 2.9 (L) 3.5 - 5.1 mmol/L   Chloride 103 101 - 111 mmol/L   CO2 29 22 - 32 mmol/L   Glucose, Bld 122 (H) 65 - 99 mg/dL   BUN 21 (H) 6 - 20 mg/dL   Creatinine, Ser 0.94 0.44 - 1.00 mg/dL   Calcium 9.2 8.9 - 10.3 mg/dL   Total Protein 6.1 (L) 6.5 - 8.1 g/dL   Albumin 3.4 (L) 3.5 - 5.0 g/dL   AST 22 15 - 41 U/L   ALT 20 14 - 54 U/L   Alkaline Phosphatase 64 38 - 126  U/L   Total Bilirubin 0.4 0.3 - 1.2 mg/dL   GFR calc non Af Amer 52 (L) >60 mL/min   GFR calc Af Amer >60 >60 mL/min    Comment: (NOTE) The eGFR has been calculated using the CKD EPI equation. This calculation has not been validated in all clinical situations. eGFR's persistently <60 mL/min signify possible Chronic Kidney Disease.    Anion gap 8 5 - 15    Comment: Performed at [facility], [place] [address] [place], [place] [address]  CBC     Status: Abnormal   Collection Time: 08/12/17 12:32 PM  Result Value Ref Range   WBC 7.3 4.0 - 10.5 K/uL   RBC 3.80 (L) 3.87 - 5.11 MIL/uL   Hemoglobin 8.1 (L) 12.0 - 15.0 g/dL   HCT 27.4 (L) 36.0 - 46.0 %   MCV 72.1 (L) 78.0 - 100.0 fL   MCH 21.3 (L) 26.0 - 34.0 pg   MCHC 29.6 (L) 30.0 - 36.0 g/dL   RDW 16.5 (H) 11.5 - 15.5 %   Platelets 423 (H) 150 - 400 K/uL    Comment: Performed at [facility], [place] [address] [place], [place] [address]  Type and screen [place]     Status: None   Collection Time: 08/12/17 12:37 PM  Result Value Ref Range   ABO/RH(D) O POS    Antibody Screen NEG    Sample Expiration      08/15/2017 Performed at [facility], [place] [address] [place], [place] [address]   POC occult blood, ED     Status: None   Collection Time: 08/12/17  1:14 PM  Result Value Ref Range   Fecal Occult Bld NEGATIVE NEGATIVE   Dg Chest 2 View  Result Date: 08/12/2017 CLINICAL DATA:  Patient has been throwing up bright red blood since last night, prior to that, she was vomiting dark blood. Hx of dementia, GERD, hypertension, SVT. EXAM: CHEST - 2 VIEW COMPARISON:  05/07/2017 FINDINGS: Cardiac silhouette is normal in size and configuration. No mediastinal or hilar masses. No evidence adenopathy. There is hazy lung base opacity, most prominent on the left, which projects in the lower lobes on the lateral view. This may be due to atelectasis or infection. There is no  evidence of pulmonary edema. Remainder of the lungs is clear. No pleural effusion or pneumothorax. Left anterior chest wall sequential pacemaker is stable. Skeletal structures are demineralized but grossly intact. IMPRESSION: 1. Hazy lung base opacity,  most evident on the left, which may reflect pneumonia or be due to atelectasis. No evidence of pulmonary edema. Electronically Signed   By: Lajean Manes M.D.   On: 08/12/2017 13:07    Pending Labs Unresulted Labs (From admission, onward)   None      Vitals/Pain Today's Vitals   08/12/17 1228 08/12/17 1657  BP: (!) 148/68 (!) 158/84  Pulse: 73 74  Resp: 16 16  Temp: 98 F (36.7 C)   TempSrc: Oral   SpO2: 100% 100%    Isolation Precautions No active isolations  Medications Medications  dextrose 5 % and 0.45 % NaCl with KCl 40 mEq/L infusion (not administered)  potassium chloride SA (K-DUR,KLOR-CON) CR tablet 40 mEq (not administered)    Mobility walks with person assist

## 2017-08-12 NOTE — ED Provider Notes (Signed)
[place] DEPT Provider Note   CSN: [id number] Arrival date & time: 08/12/17  1148     History   Chief Complaint Chief Complaint  Patient presents with  . Hemoptysis   Level 5 caveat secondary to dementia. HPI Shelly Ross is a 84 y.o. female.  She is brought in by her daughter from [place] memory care unit.  She had a prior history of spitting up blood that was thought to be from her GI tract.  She had an endoscopy by the [facility] in the past and is on a PPI and Carafate.  It sounds like this helped the symptoms for a while but over the last month she has had more episodes of spitting up some material that is either phlegm, or coffee-ground material.  Today there is some concern that she threw up some red blood.  The patient herself has dementia and is unable to give any history but denies any complaints other than feeling bad.  The daughter states she is had that complaint for years.  The history is provided by the patient and a relative.  Emesis   This is a recurrent problem. The current episode started more than 1 week ago. Episode frequency: infrequent. The problem has not changed since onset.The emesis has an appearance of bright red blood and stomach contents (small amounts). There has been no fever. Pertinent negatives include no abdominal pain, no chills, no cough, no diarrhea, no fever and no URI.    Past Medical History:  Diagnosis Date  . Adenomatous colon polyp 2003  . Degenerative disc disease    CERVICAL SPINE,W/RADICULOPATHY  . Dementia   . GERD (gastroesophageal reflux disease)   . Headache(784.0)    not migraines, but a lot of headaches  . Hyperlipidemia   . Hypertension   . Hypothyroidism   . Osteoporosis   . Premature ventricular contractions 2006  . Rib fractures   . Stricture and stenosis of esophagus 2007  . SVT (supraventricular tachycardia) ([place])    S/P ablation 2005    Patient Active Problem List   Diagnosis Date  Noted  . Coffee ground emesis 05/07/2017  . UTI (urinary tract infection) 07/21/2015  . Sepsis secondary to UTI ([place])   . Second degree AV block   . HLD (hyperlipidemia)   . Acute UTI 07/20/2015  . Elevated lactic acid level 07/20/2015  . Pyelonephritis 07/20/2015  . Nausea and vomiting 07/20/2015  . Fall 05/28/2014  . Multiple rib fractures 05/24/2014  . Hyponatremia 04/21/2013  . Postoperative anemia due to acute blood loss 04/21/2013  . Hypokalemia 04/21/2013  . AV block, 2nd degree 04/18/2013  . OA (osteoarthritis) of knee 04/17/2013  . DJD (degenerative joint disease) of knee 02/15/2013  . Diarrhea 02/10/2013  . Hypercalcemia 01/17/2013  . Memory loss 09/09/2012  . Varicose veins of lower extremities with other complications 68/34/1962  . ANXIETY 07/23/2010  . DEGENERATIVE DISC DISEASE, LUMBAR SPINE 07/09/2009  . EPISTAXIS 07/09/2009  . OSTEOPENIA 02/08/2009  . PALPITATIONS, RECURRENT 10/22/2008  . Essential hypertension 01/12/2008  . PREMATURE VENTRICULAR CONTRACTIONS 01/12/2008  . DIZZINESS 01/12/2008  . Hypothyroidism 07/18/2007  . HYPERLIPIDEMIA 07/18/2007  . Esophagitis 07/18/2007  . DEGENERATIVE DISC DISEASE, CERVICAL SPINE, W/RADICULOPATHY 07/18/2007    Past Surgical History:  Procedure Laterality Date  . ABDOMINAL HYSTERECTOMY     WITH OOPHORECTOMY,? BILATERAL FOR ENDOMETRIOSIS  . BLADDER TACKING    . BREAST ENHANCEMENT SURGERY    . CARDIAC ELECTROPHYSIOLOGY MAPPING AND ABLATION    .  CATARACT EXTRACTION Bilateral   . COLONOSCOPY    . ENDOVENOUS ABLATION SAPHENOUS VEIN W/ LASER  07-13-2011   right greater saphenous vein, Dr Mamie Nick  . ENDOVENOUS ABLATION SAPHENOUS VEIN W/ LASER  08/2011   L ; Dr Kellie Simmering  . esophageal dilation      X 1  . G2 P2    . PERMANENT PACEMAKER INSERTION N/A 04/19/2013   MDT Adapta L iimplanted by Dr Rayann Heman for mobitz II second degree AV block  . TOTAL KNEE ARTHROPLASTY Right 04/17/2013   Procedure: RIGHT TOTAL KNEE  ARTHROPLASTY, CORTISONE INJECTION LEFT KNEE;  Surgeon: Gearlean Alf, MD;  Location: [facility];  Service: Orthopedics;  Laterality: Right;    OB History    No data available       Home Medications    Prior to Admission medications   Medication Sig Start Date End Date Taking? Authorizing Provider  acetaminophen (TYLENOL) 650 MG CR tablet acetaminophen ER 650 mg tablet,extended release  Take 2 tablets every 8 hours by oral route.   Yes [provider]  amLODipine (NORVASC) 5 MG tablet Take 1 tablet (5 mg total) by mouth daily. 05/08/17  Yes Hosie Poisson, MD  busPIRone (BUSPAR) 5 MG tablet Take 15 mg by mouth 2 (two) times daily.   Yes [provider]  Cranberry 450 MG TABS Take 1 tablet by mouth daily with breakfast.   Yes [provider]  levothyroxine (SYNTHROID, LEVOTHROID) 75 MCG tablet Take 37.5-75 mcg by mouth daily before breakfast. Takes 37.5 mcg every day except 75 mcg on Wednesday   Yes [provider]  pantoprazole (PROTONIX) 40 MG tablet Take 1 tablet (40 mg total) by mouth 2 (two) times daily before a meal. 06/15/17  Yes Lemmon, Lavone Nian, PA  polyethylene glycol [facility]) packet Take 17 g by mouth daily. 05/08/17  Yes Ghimire, Henreitta Leber, MD  senna (SENOKOT) 8.6 MG TABS tablet Take 2 tablets (17.2 mg total) by mouth at bedtime. 05/08/17  Yes Ghimire, Henreitta Leber, MD  sucralfate (CARAFATE) 1 g tablet Take 1 tablet (1 g total) by mouth 4 (four) times daily -  with meals and at bedtime. 06/16/17  Yes Gatha Mayer, MD  tolterodine (DETROL LA) 4 MG 24 hr capsule Take 4 mg by mouth daily with breakfast.    Yes [provider]  traMADol (ULTRAM) 50 MG tablet Take 1 tablet (50 mg total) by mouth every 6 (six) hours as needed. 05/08/17  Yes Ghimire, Henreitta Leber, MD    Family History Family History  Problem Relation Age of Onset  . Stroke Mother 95  . Heart attack Father        in 1s  . Deep vein thrombosis Brother        AND PTE  .  Diabetes Brother   . Cancer Paternal Aunt         INTESTINAL   . Alzheimer's disease Sister   . Alzheimer's disease Brother   . Tuberculosis Brother   . Aneurysm Brother        cns  . Heart Problems Unknown   . COPD Sister        smoking  . Heart attack Sister 5    Social History Social History   Tobacco Use  . Smoking status: Never Smoker  . Smokeless tobacco: Never Used  Substance Use Topics  . Alcohol use: No    Comment: wine, seldom   . Drug use: No  Allergies   Lansoprazole; Percocet [oxycodone-acetaminophen]; Aricept [donepezil hcl]; Exelon [rivastigmine tartrate]; and Namenda [memantine]   Review of Systems Review of Systems  Unable to perform ROS: Dementia  Constitutional: Negative for chills and fever.  Respiratory: Negative for cough.   Gastrointestinal: Positive for vomiting. Negative for abdominal pain and diarrhea.     Physical Exam Updated Vital Signs BP (!) 148/68 (BP Location: Left Arm)   Pulse 73   Temp 98 F (36.7 C) (Oral)   Resp 16   SpO2 100%   Physical Exam  Constitutional: She appears well-developed and well-nourished. No distress.  HENT:  Head: Normocephalic and atraumatic.  Eyes: Conjunctivae are normal.  Neck: Neck supple.  Cardiovascular: Normal rate and regular rhythm.  No murmur heard. Pulmonary/Chest: Effort normal and breath sounds normal. No respiratory distress.  Abdominal: Soft. She exhibits no mass. There is no tenderness. There is no rebound and no guarding.  Genitourinary: Rectal exam shows guaiac positive stool (pending). Rectal exam shows no mass.  Genitourinary Comments: Chaperone was present during exam.Nurse   Musculoskeletal: She exhibits no edema, tenderness or deformity.  Neurological: She is alert.  Skin: Skin is warm and dry.  Psychiatric: She has a normal mood and affect.  Nursing note and vitals reviewed.    ED Treatments / Results  Labs (all labs ordered are listed, but only abnormal results  are displayed) Labs Reviewed  COMPREHENSIVE METABOLIC PANEL - Abnormal; Notable for the following components:      Result Value   Potassium 2.9 (*)    Glucose, Bld 122 (*)    BUN 21 (*)    Total Protein 6.1 (*)    Albumin 3.4 (*)    GFR calc non Af Amer 52 (*)    All other components within normal limits  CBC - Abnormal; Notable for the following components:   RBC 3.80 (*)    Hemoglobin 8.1 (*)    HCT 27.4 (*)    MCV 72.1 (*)    MCH 21.3 (*)    MCHC 29.6 (*)    RDW 16.5 (*)    Platelets 423 (*)    All other components within normal limits  BASIC METABOLIC PANEL - Abnormal; Notable for the following components:   Potassium 3.4 (*)    Glucose, Bld 107 (*)    All other components within normal limits  CBC WITH DIFFERENTIAL/PLATELET - Abnormal; Notable for the following components:   RBC 3.86 (*)    Hemoglobin 8.1 (*)    HCT 27.3 (*)    MCV 70.7 (*)    MCH 21.0 (*)    MCHC 29.7 (*)    RDW 16.3 (*)    Platelets 411 (*)    All other components within normal limits  CULTURE, BLOOD (ROUTINE X 2)  CULTURE, BLOOD (ROUTINE X 2)  CULTURE, EXPECTORATED SPUTUM-ASSESSMENT  STREP PNEUMONIAE URINARY ANTIGEN  MAGNESIUM  POC OCCULT BLOOD, ED  TYPE AND SCREEN    EKG  EKG Interpretation None       Radiology No results found. cxr reviewed by me  Procedures Procedures (including critical care time)  Medications Ordered in ED Medications - No data to display   Initial Impression / Assessment and Plan / ED Course  I have reviewed the triage vital signs and the nursing notes.  Pertinent labs & imaging results that were available during my care of the patient were reviewed by me and considered in my medical decision making (see chart for details).  Clinical Course as  of Aug 15 1914  Thu Aug 12, 2017  1600 I reviewed the findings with GI [place].  Her endoscopy in January demonstrated grade C esophagitis and a large hiatal hernia.  They felt that if her hemoglobin remained  stable that they would not scope her but they will follow along.  Cussed with hospitalist Dr. Karleen Hampshire who accepts patient to her service.  Patient  [MB]    Clinical Course User Index [MB] Hayden Rasmussen, MD     Final Clinical Impressions(s) / ED Diagnoses   Final diagnoses:  Upper GI bleed    ED Discharge Orders    None       Hayden Rasmussen, MD 08/14/17 [phone]

## 2017-08-12 NOTE — Progress Notes (Signed)
Pharmacy Antibiotic Note  Shelly Ross is a 84 y.o. female admitted on 08/12/2017 with coffee ground emesis; CXR shows questionable pneumonia.  Pharmacy has been consulted for vancomycin and cefepime dosing.  Plan: Cefepime 2g IV x 1, then 1g IV q24h Vancomycin 1g IV q48h for estimated AUC 442 Check vancomycin levels at steady state, goal AUC 400-500 Follow up renal function & cultures    Temp (24hrs), Avg:98 F (36.7 C), Min:98 F (36.7 C), Max:98 F (36.7 C)  Recent Labs  Lab 08/12/17 1232  WBC 7.3  CREATININE 0.94    CrCl cannot be calculated (Unknown ideal weight.).    Allergies  Allergen Reactions  . Lansoprazole Nausea Only and Other (See Comments)    Reaction:  Headaches   . Percocet [Oxycodone-Acetaminophen] Nausea And Vomiting  . Aricept [Donepezil Hcl]     Unknown reaction per MAR   . Exelon [Rivastigmine Tartrate]     Unknown reaction per MAR   . Namenda [Memantine]     Unknown reaction per MAR     Antimicrobials this admission:  3/14 Vanc >> 3/14 Cefepime >>  Dose adjustments this admission:    Microbiology results:  3/14 BCx: 3/14 Sputum: 3/14 Urine strep Ag:  Thank you for allowing pharmacy to be a part of this patient's care.  Peggyann Juba, PharmD, BCPS Pager: [phone] 08/12/2017 5:25 PM

## 2017-08-13 ENCOUNTER — Telehealth: Payer: Self-pay | Admitting: Internal Medicine

## 2017-08-13 ENCOUNTER — Encounter: Payer: Self-pay | Admitting: Internal Medicine

## 2017-08-13 DIAGNOSIS — K920 Hematemesis: Secondary | ICD-10-CM

## 2017-08-13 DIAGNOSIS — K922 Gastrointestinal hemorrhage, unspecified: Secondary | ICD-10-CM | POA: Diagnosis not present

## 2017-08-13 LAB — CBC WITH DIFFERENTIAL/PLATELET
BASOS ABS: 0 10*3/uL (ref 0.0–0.1)
BASOS PCT: 0 %
Eosinophils Absolute: 0.2 10*3/uL (ref 0.0–0.7)
Eosinophils Relative: 4 %
HCT: 27.3 % — ABNORMAL LOW (ref 36.0–46.0)
Hemoglobin: 8.1 g/dL — ABNORMAL LOW (ref 12.0–15.0)
LYMPHS ABS: 1.9 10*3/uL (ref 0.7–4.0)
Lymphocytes Relative: 30 %
MCH: 21 pg — ABNORMAL LOW (ref 26.0–34.0)
MCHC: 29.7 g/dL — AB (ref 30.0–36.0)
MCV: 70.7 fL — ABNORMAL LOW (ref 78.0–100.0)
MONOS PCT: 10 %
Monocytes Absolute: 0.6 10*3/uL (ref 0.1–1.0)
NEUTROS ABS: 3.5 10*3/uL (ref 1.7–7.7)
Neutrophils Relative %: 56 %
PLATELETS: 411 10*3/uL — AB (ref 150–400)
RBC: 3.86 MIL/uL — ABNORMAL LOW (ref 3.87–5.11)
RDW: 16.3 % — AB (ref 11.5–15.5)
WBC: 6.2 10*3/uL (ref 4.0–10.5)

## 2017-08-13 LAB — BASIC METABOLIC PANEL
Anion gap: 8 (ref 5–15)
BUN: 9 mg/dL (ref 6–20)
CHLORIDE: 110 mmol/L (ref 101–111)
CO2: 25 mmol/L (ref 22–32)
CREATININE: 0.58 mg/dL (ref 0.44–1.00)
Calcium: 9.1 mg/dL (ref 8.9–10.3)
Glucose, Bld: 107 mg/dL — ABNORMAL HIGH (ref 65–99)
Potassium: 3.4 mmol/L — ABNORMAL LOW (ref 3.5–5.1)
SODIUM: 143 mmol/L (ref 135–145)

## 2017-08-13 LAB — MAGNESIUM: MAGNESIUM: 1.9 mg/dL (ref 1.7–2.4)

## 2017-08-13 MED ORDER — POTASSIUM CHLORIDE ER 20 MEQ PO TBCR: 20.0000 meq | EXTENDED_RELEASE_TABLET | Freq: Every day | ORAL | 0 refills | Status: DC

## 2017-08-13 MED ORDER — LEVOFLOXACIN 500 MG PO TABS: 500.0000 mg | ORAL_TABLET | ORAL | Status: DC

## 2017-08-13 MED ORDER — LEVOFLOXACIN 500 MG PO TABS
500.0000 mg | ORAL_TABLET | Freq: Every day | ORAL | Status: DC
  Filled 2017-08-13: qty 1

## 2017-08-13 MED ORDER — LEVOFLOXACIN 500 MG PO TABS: 500.0000 mg | ORAL_TABLET | Freq: Every day | ORAL | 0 refills | Status: DC

## 2017-08-13 MED ORDER — SUCRALFATE 1 G PO TABS: 1.0000 g | ORAL_TABLET | Freq: Three times a day (TID) | ORAL | Status: DC

## 2017-08-13 MED ORDER — LEVOFLOXACIN 500 MG PO TABS
500.0000 mg | ORAL_TABLET | Freq: Once | ORAL | Status: DC
  Filled 2017-08-13: qty 1

## 2017-08-13 NOTE — Telephone Encounter (Signed)
Webb Silversmith notified that I do not have any earlier appts at this time.  Will need to return to the ED if she has any more bleeding before appt on 3/20

## 2017-08-13 NOTE — Progress Notes (Signed)
Went over d/c instructions with patient's son and daughter.  Both verbalized understanding.  Patient left hospital with dauhter via w/c.  Report called to Webb Silversmith, RN at [facility] care unit. Virginia Rochester, RN

## 2017-08-13 NOTE — Evaluation (Signed)
Clinical/Bedside Swallow Evaluation Patient Details  Name: Shelly Ross MRN: [id number] Date of Birth: [birth year]  Today's Date: 08/13/2017 Time: SLP Start Time (ACUTE ONLY): 1210 SLP Stop Time (ACUTE ONLY): 1230 SLP Time Calculation (min) (ACUTE ONLY): 20 min  Past Medical History:  Past Medical History:  Diagnosis Date  . Adenomatous colon polyp 2003  . Degenerative disc disease    CERVICAL SPINE,W/RADICULOPATHY  . Dementia   . GERD (gastroesophageal reflux disease)   . Headache(784.0)    not migraines, but a lot of headaches  . Hyperlipidemia   . Hypertension   . Hypothyroidism   . Osteoporosis   . Premature ventricular contractions 2006  . Rib fractures   . Stricture and stenosis of esophagus 2007  . SVT (supraventricular tachycardia) ([place])    S/P ablation 2005   Past Surgical History:  Past Surgical History:  Procedure Laterality Date  . ABDOMINAL HYSTERECTOMY     WITH OOPHORECTOMY,? BILATERAL FOR ENDOMETRIOSIS  . BLADDER TACKING    . BREAST ENHANCEMENT SURGERY    . CARDIAC ELECTROPHYSIOLOGY MAPPING AND ABLATION    . CATARACT EXTRACTION Bilateral   . COLONOSCOPY    . ENDOVENOUS ABLATION SAPHENOUS VEIN W/ LASER  07-13-2011   right greater saphenous vein, Dr Mamie Nick  . ENDOVENOUS ABLATION SAPHENOUS VEIN W/ LASER  08/2011   L ; Dr Kellie Simmering  . esophageal dilation      X 1  . G2 P2    . PERMANENT PACEMAKER INSERTION N/A 04/19/2013   MDT Adapta L iimplanted by Dr Rayann Heman for mobitz II second degree AV block  . TOTAL KNEE ARTHROPLASTY Right 04/17/2013   Procedure: RIGHT TOTAL KNEE ARTHROPLASTY, CORTISONE INJECTION LEFT KNEE;  Surgeon: Gearlean Alf, MD;  Location: [facility];  Service: Orthopedics;  Laterality: Right;   HPI:  84 yo female adm to [facility] after vomiting bright red blood.  PMH + for dementia, GERD, HLD, HTN, cervical spine radiculopathy.  Prior UGI study in 09/2015 found faint reflux and prominent cricopharyngeus with functional swallow mechanism.  Per abdomen  image in 05/2017, pt's hiatal hernia had increased in size.  Pt is s/p endoscopy January 2019 by Dr Carlean Purl and was placed on Carafate.  She now has pna and MD ordered swallow evaluation.  Pt's daughter and son denies pt having oropharyngeal dysphagia.     Assessment / Plan / Recommendation Clinical Impression  Pt presents with negative CN exam.  No indication of aspiration with liquids alone.  Pt did report sensation of cervical-esophageal dysphagia with sensation of tablet lodging at pharynx with immediate cough post-swallow.  Intake of italian ice faciliated clearance.  Suspect prominent cricopharyngeus may contribute to pharyngeal stasis of solids/pill.  Advised pt take medications with puree - start and follow with plenty of liquids.  Recommend pt continue diet as tolerated.    Do not recommend pt have further SlP evaluations (unless oropharyngeal dysphagia suspected) as pt has known GI dysphagia and is followed by GI MD.  Education completed.   SLP Visit Diagnosis: Dysphagia, pharyngoesophageal phase (R13.14);Dysphagia, unspecified (R13.10)(esophageal dysphagia)    Aspiration Risk  Mild aspiration risk    Diet Recommendation Regular;Thin liquid   Liquid Administration via: Cup;Straw Medication Administration: Whole meds with puree(start and follow with liquids) Supervision: Patient able to self feed Compensations: Slow rate;Small sips/bites;Follow solids with liquid Postural Changes: Seated upright at 90 degrees;Remain upright for at least 30 minutes after po intake    Other  Recommendations Oral Care Recommendations: Oral care BID  Follow up Recommendations None      Frequency and Duration   n/a         Prognosis   n/a     Swallow Study   General Date of Onset: 08/13/17 HPI: 84 yo female adm to [facility] after vomiting bright red blood.  PMH + for dementia, GERD, HLD, HTN, cervical spine radiculopathy.  Prior UGI study in 09/2015 found faint reflux and prominent cricopharyngeus  with functional swallow mechanism.  Per abdomen image in 05/2017, pt's hiatal hernia had increased in size.  Pt is s/p endoscopy January 2019 by Dr Carlean Purl and was placed on Carafate.  She now has pna and MD ordered swallow evaluation.  Pt's daughter and son denies pt having oropharyngeal dysphagia.   Type of Study: Bedside Swallow Evaluation Diet Prior to this Study: Thin liquids Temperature Spikes Noted: No Respiratory Status: Room air History of Recent Intubation: No Behavior/Cognition: Alert;Cooperative;Pleasant mood Oral Cavity Assessment: Within Functional Limits Oral Care Completed by SLP: No Oral Cavity - Dentition: Adequate natural dentition Vision: Functional for self-feeding Self-Feeding Abilities: Able to feed self Patient Positioning: Upright in bed Baseline Vocal Quality: Normal Volitional Swallow: Able to elicit    Oral/Motor/Sensory Function Overall Oral Motor/Sensory Function: Within functional limits   Ice Chips Ice chips: Not tested   Thin Liquid Thin Liquid: Within functional limits Presentation: Cup;Straw    Nectar Thick Nectar Thick Liquid: Not tested   Honey Thick Honey Thick Liquid: Not tested   Puree Puree: Within functional limits Presentation: Self Fed;Spoon   Solid   GO   Solid: Not tested Other Comments: pt was on clear liquid diet        Macario Golds 08/13/2017,12:40 PM  Luanna Salk, [place] [facility] SLP [phone]

## 2017-08-13 NOTE — NC FL2 (Addendum)
[place] LEVEL OF CARE SCREENING TOOL     IDENTIFICATION  Patient Name: Shelly Ross Birthdate: 04-13-1925 Sex: female Admission Date (Current Location): 08/12/2017  [facility] [place] Number:  Herbalist and Address:  [facility],  [place] [address] [place]      Provider Number: [id number]  Attending Physician Name and Address:  Marene Lenz, MD  Relative Name and Phone Number:       Current Level of Care: Hospital Recommended Level of Care: Memory Care Prior Approval Number:    Date Approved/Denied:   PASRR Number:    Discharge Plan:  Memory Care    Current Diagnoses: Patient Active Problem List   Diagnosis Date Noted  . Coffee ground emesis 05/07/2017  . UTI (urinary tract infection) 07/21/2015  . Sepsis secondary to UTI ([place])   . Second degree AV block   . HLD (hyperlipidemia)   . Acute UTI 07/20/2015  . Elevated lactic acid level 07/20/2015  . Pyelonephritis 07/20/2015  . Nausea and vomiting 07/20/2015  . Fall 05/28/2014  . Multiple rib fractures 05/24/2014  . Hyponatremia 04/21/2013  . Postoperative anemia due to acute blood loss 04/21/2013  . Hypokalemia 04/21/2013  . AV block, 2nd degree 04/18/2013  . OA (osteoarthritis) of knee 04/17/2013  . DJD (degenerative joint disease) of knee 02/15/2013  . Diarrhea 02/10/2013  . Hypercalcemia 01/17/2013  . Memory loss 09/09/2012  . Varicose veins of lower extremities with other complications 08/65/7846  . ANXIETY 07/23/2010  . DEGENERATIVE DISC DISEASE, LUMBAR SPINE 07/09/2009  . EPISTAXIS 07/09/2009  . OSTEOPENIA 02/08/2009  . PALPITATIONS, RECURRENT 10/22/2008  . Essential hypertension 01/12/2008  . PREMATURE VENTRICULAR CONTRACTIONS 01/12/2008  . DIZZINESS 01/12/2008  . Hypothyroidism 07/18/2007  . HYPERLIPIDEMIA 07/18/2007  . Esophagitis 07/18/2007  . DEGENERATIVE DISC DISEASE, CERVICAL SPINE, W/RADICULOPATHY 07/18/2007    Orientation  RESPIRATION BLADDER Height & Weight     Self  Normal Continent Weight:   Height:     BEHAVIORAL SYMPTOMS/MOOD NEUROLOGICAL BOWEL NUTRITION STATUS      Continent Diet(Low Sodium Heart Healthy )  AMBULATORY STATUS COMMUNICATION OF NEEDS Skin   Extensive Assist   Normal                       Personal Care Assistance Level of Assistance  Bathing, Feeding, Dressing Bathing Assistance: Limited assistance Feeding assistance: Independent Dressing Assistance:Indepedent      Functional Limitations Info  Sight, Hearing, Speech Sight Info: Adequate Hearing Info: Impaired Speech Info: Adequate    SPECIAL CARE FACTORS FREQUENCY                       Contractures Contractures Info: Not present    Additional Factors Info  Code Status, Allergies Code Status Info: DNR Allergies Info: Allergies: Lansoprazole, Percocet Oxycodone-acetaminophen, Aricept Donepezil Hcl, Exelon Rivastigmine Tartrate, Namenda Memantine           Current Medications (08/13/2017):  This is the current hospital active medication list Current Facility-Administered Medications  Medication Dose Route Frequency Provider Last Rate Last Dose  . busPIRone (BUSPAR) tablet 15 mg  15 mg Oral BID Hosie Poisson, MD   15 mg at 08/13/17 1008  . fesoterodine (TOVIAZ) tablet 4 mg  4 mg Oral Daily Hosie Poisson, MD   4 mg at 08/13/17 1008  . levofloxacin (LEVAQUIN) tablet 500 mg  500 mg Oral Once Marene Lenz, MD      . [  START ON 08/14/2017] levofloxacin (LEVAQUIN) tablet 500 mg  500 mg Oral Q24H Adhikari Bk, Amrit, MD      . levothyroxine (SYNTHROID, LEVOTHROID) tablet 37.5 mcg  37.5 mcg Oral Once per day on Sun Mon Tue Thu Fri Sat Hosie Poisson, MD   37.5 mcg at 08/13/17 0747  . [START ON 08/18/2017] levothyroxine (SYNTHROID, LEVOTHROID) tablet 75 mcg  75 mcg Oral Once per day on Wed Akula, Vijaya, MD      . pantoprazole (PROTONIX) EC tablet 40 mg  40 mg Oral BID Hosie Poisson, MD   40 mg at 08/13/17 1008  .  sucralfate (CARAFATE) tablet 1 g  1 g Oral TID WC & HS Adhikari Bk, Amrit, MD      . traMADol (ULTRAM) tablet 50 mg  50 mg Oral Q6H PRN Hosie Poisson, MD         Discharge Medications: Please see discharge summary for a list of discharge medications.  Relevant Imaging Results:  Relevant Lab Results:   Additional Information SS#: [id number]  Lia Hopping, LCSW

## 2017-08-13 NOTE — Discharge Summary (Signed)
Physician Discharge Summary  Shelly Ross ZHY:[id number] DOB: December 10, 1924 DOA: 08/12/2017  PCP: [name], MD  Admit date: 08/12/2017 Discharge date: 08/13/2017  Admitted From: SNF Disposition:  SNF  Discharge Condition: Stable CODE STATUS:Full Diet recommendation: Heart Healthy   Brief/Interim Summary:  H and P: Shelly Ross is a 84 y.o. female with medical history significant of hypertension, hyperlipidemia, hypothyroidism, dementia, GERD, History of Mobitz type II second-degree heart block requiring pacemaker implantation in 2014, hiatal hernia, was brought in for persistent coffee ground emesis since one week, happening only at night. Pt has dementia, and detailed ROS couldn't be obtained, pt denies any chest pain or abdominal pain. Pt;s son and daughter at bedside, gave most of the history. They reports, she has been spitting up blood and occasionally coffee ground looking spit in small amounts every day. She does not appear to be in any distress. No other complaints. She was admitted in December for similar complaints and discharged to follow up with GI as outpatient. Her hemoglobin today is 8.1 and has dropped from 10. Lab work in ED, revealed potassium of 2.9 and hemoglobin of 8.2 and platelets of 423. Fecal for occult blood is negative. cxr shows left lung opacity, unclear if its pneumonia or atelectasis. She is afebrile and her vs wnl.  She is referred to medical service for admission .   Hospital Course: Patient hospital course remained stable.  This morning she remained hemodynamically stable.  No more episodes of coffee-ground emesis.  Admitting physician discussed with the GI.   GI did not plan for any further procedures if her hemoglobin remains stable.Her hemoglobin remained stable around 8.1.  She is already on Protonix twice a day.Also on carafate.She will follow-up with GI as an outpatient. Admission Chest  x-ray was suggestive of left hazy lung base opacity possible  pneumonia versus atelectasis.  Patient was started on broad-spectrum antibiotics.  Since she is afebrile and her vitals are stable and she does not have any respiratory issues, we will change antibiotics to oral.  Patient is stable for discharge to skilled nursing facility today.SW made aware.  Following problems were addressed during her hospitalization:  Coffee ground emesis: No new episodes.Hb at 8.1 GI consulted by admitting physician , did not recommend any further intervention due to her advanced dementia .  Check CBC in a week Follow up with GI as an outpatient if  she continues to have coffee-ground emesis or if her hemoglobin drops below 7. Per AVS,she has appointment with [facility] on March 20.  Hypertension:  Well controlled.   Hypothyroidism:  Resume synthroid.   Hypokalemia: Supplemented.  Check BMP in a week  Dementia: Continue supportive care  Discharge Diagnoses:  Active Problems:   Coffee ground emesis    Discharge Instructions  Discharge Instructions    Diet - low sodium heart healthy   Complete by:  As directed    Discharge instructions   Complete by:  As directed    1) Take prescribed medications as instructed. 2)Check CBC and BMP in a week. 3)Ensure follow-up with speech therapy at SNF 4) Follow-up with GI as an outpatient   Increase activity slowly   Complete by:  As directed      Allergies as of 08/13/2017      Reactions   Lansoprazole Nausea Only, Other (See Comments)   Reaction:  Headaches    Percocet [oxycodone-acetaminophen] Nausea And Vomiting   Aricept [donepezil Hcl]    Unknown reaction per [facility]  Exelon [rivastigmine Tartrate]    Unknown reaction per MAR    Namenda [memantine]    Unknown reaction per [facility]       Medication List    TAKE these medications   acetaminophen 650 MG CR tablet Commonly known as:  TYLENOL acetaminophen ER 650 mg tablet,extended release  Take 2 tablets every 8 hours by oral route.   amLODipine 5 MG  tablet Commonly known as:  NORVASC Take 1 tablet (5 mg total) by mouth daily.   busPIRone 5 MG tablet Commonly known as:  BUSPAR Take 15 mg by mouth 2 (two) times daily.   Cranberry 450 MG Tabs Take 1 tablet by mouth daily with breakfast.   levofloxacin 500 MG tablet Commonly known as:  LEVAQUIN Take 1 tablet (500 mg total) by mouth daily.   levothyroxine 75 MCG tablet Commonly known as:  SYNTHROID, LEVOTHROID Take 37.5-75 mcg by mouth daily before breakfast. Takes 37.5 mcg every day except 75 mcg on Wednesday   pantoprazole 40 MG tablet Commonly known as:  PROTONIX Take 1 tablet (40 mg total) by mouth 2 (two) times daily before a meal.   polyethylene glycol packet Commonly known as:  MIRALAX Take 17 g by mouth daily.   Potassium Chloride ER 20 MEQ Tbcr Take 20 mEq by mouth daily for 7 days.   senna 8.6 MG Tabs tablet Commonly known as:  SENOKOT Take 2 tablets (17.2 mg total) by mouth at bedtime.   sucralfate 1 g tablet Commonly known as:  CARAFATE Take 1 tablet (1 g total) by mouth 4 (four) times daily -  with meals and at bedtime.   tolterodine 4 MG 24 hr capsule Commonly known as:  DETROL LA Take 4 mg by mouth daily with breakfast.   traMADol 50 MG tablet Commonly known as:  ULTRAM Take 1 tablet (50 mg total) by mouth every 6 (six) hours as needed.      Follow-up Information    Norins, Heinz Knuckles, MD. Schedule an appointment as soon as possible for a visit in 1 week(s).   Specialty:  Internal Medicine Contact information: [place] [address] [phone]          Allergies  Allergen Reactions  . Lansoprazole Nausea Only and Other (See Comments)    Reaction:  Headaches   . Percocet [Oxycodone-Acetaminophen] Nausea And Vomiting  . Aricept [Donepezil Hcl]     Unknown reaction per [facility]   . Exelon [Rivastigmine Tartrate]     Unknown reaction per [facility]   . Namenda [Memantine]     Unknown reaction per [facility]      Consultations:  None   Procedures/Studies: Dg Chest 2 View  Result Date: 08/12/2017 CLINICAL DATA:  Patient has been throwing up bright red blood since last night, prior to that, she was vomiting dark blood. Hx of dementia, GERD, hypertension, SVT. EXAM: CHEST - 2 VIEW COMPARISON:  05/07/2017 FINDINGS: Cardiac silhouette is normal in size and configuration. No mediastinal or hilar masses. No evidence adenopathy. There is hazy lung base opacity, most prominent on the left, which projects in the lower lobes on the lateral view. This may be due to atelectasis or infection. There is no evidence of pulmonary edema. Remainder of the lungs is clear. No pleural effusion or pneumothorax. Left anterior chest wall sequential pacemaker is stable. Skeletal structures are demineralized but grossly intact. IMPRESSION: 1. Hazy lung base opacity, most evident on the left, which may reflect pneumonia or be due to atelectasis.  No evidence of pulmonary edema. Electronically Signed   By: Lajean Manes M.D.   On: 08/12/2017 13:07    (Echo, Carotid, EGD, Colonoscopy, ERCP)    Subjective: Patient seen and examined the bedside this morning.  Remains comfortable.  No new issues.  No episodes of coffee-ground emesis since admission.  Hemodynamically stable.  Discharge Exam: Vitals:   08/12/17 2010 08/13/17 0640  BP: (!) 148/79 (!) 163/76  Pulse: 71 72  Resp: 20 20  Temp: 98.9 F (37.2 C) 98.6 F (37 C)  SpO2: 98% 98%   Vitals:   08/12/17 1657 08/12/17 1830 08/12/17 2010 08/13/17 0640  BP: (!) 158/84 (!) 178/72 (!) 148/79 (!) 163/76  Pulse: 74 73 71 72  Resp: 16 16 20 20   Temp:  98.1 F (36.7 C) 98.9 F (37.2 C) 98.6 F (37 C)  TempSrc:  Oral Oral Oral  SpO2: 100% 98% 98% 98%    General: Pt is alert, awake, not in acute distress, pleasantly demented Cardiovascular: RRR, S1/S2 +, no rubs, no gallops Respiratory: CTA bilaterally, no wheezing, no rhonchi Abdominal: Soft, NT, ND, bowel sounds  + Extremities: no edema, no cyanosis    The results of significant diagnostics from this hospitalization (including imaging, microbiology, ancillary and laboratory) are listed below for reference.     Microbiology: Recent Results (from the past 240 hour(s))  Culture, blood (Routine X 2) w Reflex to ID Panel     Status: None (Preliminary result)   Collection Time: 08/12/17  7:09 PM  Result Value Ref Range Status   Specimen Description   Final    BLOOD RIGHT ARM Performed at [facility] [address], [place], [place] [address]    Special Requests   Final    BOTTLES DRAWN AEROBIC AND ANAEROBIC Blood Culture adequate volume   Culture   Final    NO GROWTH < 24 HOURS Performed at [facility], [place] [address] [place], [place] [address]    Report Status PENDING  Incomplete  Culture, blood (Routine X 2) w Reflex to ID Panel     Status: None (Preliminary result)   Collection Time: 08/12/17  7:15 PM  Result Value Ref Range Status   Specimen Description   Final    BLOOD RIGHT ARM Performed at [facility] [address] [place], [place] [address]    Special Requests   Final    BOTTLES DRAWN AEROBIC ONLY Blood Culture results may not be optimal due to an inadequate volume of blood received in culture bottles   Culture   Final    NO GROWTH < 24 HOURS Performed at [facility] [address], [place], [place] [address]    Report Status PENDING  Incomplete     Labs: BNP (last 3 results) No results for input(s): BNP in the last 8760 hours. Basic Metabolic Panel: Recent Labs  Lab 08/12/17 1232 08/13/17 0803  NA 140 143  K 2.9* 3.4*  CL 103 110  CO2 29 25  GLUCOSE 122* 107*  BUN 21* 9  CREATININE 0.94 0.58  CALCIUM 9.2 9.1  MG  --  1.9   Liver Function Tests: Recent Labs  Lab 08/12/17 1232  AST 22  ALT 20  ALKPHOS 64  BILITOT 0.4  PROT 6.1*  ALBUMIN 3.4*   No results for input(s): LIPASE, AMYLASE in the last  168 hours. No results for input(s): AMMONIA in the last 168 hours. CBC: Recent Labs  Lab  08/12/17 1232 08/13/17 0803  WBC 7.3 6.2  NEUTROABS  --  3.5  HGB 8.1* 8.1*  HCT 27.4* 27.3*  MCV 72.1* 70.7*  PLT 423* 411*   Cardiac Enzymes: No results for input(s): CKTOTAL, CKMB, CKMBINDEX, TROPONINI in the last 168 hours. BNP: Invalid input(s): POCBNP CBG: No results for input(s): GLUCAP in the last 168 hours. D-Dimer No results for input(s): DDIMER in the last 72 hours. Hgb A1c No results for input(s): HGBA1C in the last 72 hours. Lipid Profile No results for input(s): CHOL, HDL, LDLCALC, TRIG, CHOLHDL, LDLDIRECT in the last 72 hours. Thyroid function studies No results for input(s): TSH, T4TOTAL, T3FREE, THYROIDAB in the last 72 hours.  Invalid input(s): FREET3 Anemia work up No results for input(s): VITAMINB12, FOLATE, FERRITIN, TIBC, IRON, RETICCTPCT in the last 72 hours. Urinalysis    Component Value Date/Time   COLORURINE STRAW (A) 05/07/2017 1418   APPEARANCEUR CLEAR 05/07/2017 1418   LABSPEC 1.008 05/07/2017 1418   PHURINE 7.0 05/07/2017 1418   GLUCOSEU NEGATIVE 05/07/2017 1418   GLUCOSEU NEGATIVE 02/02/2013 1500   HGBUR NEGATIVE 05/07/2017 1418   HGBUR negative 07/09/2009 1101   BILIRUBINUR NEGATIVE 05/07/2017 1418   BILIRUBINUR Neg 10/06/2012 0927   KETONESUR NEGATIVE 05/07/2017 1418   PROTEINUR NEGATIVE 05/07/2017 1418   UROBILINOGEN 0.2 02/28/2015 2038   NITRITE NEGATIVE 05/07/2017 1418   LEUKOCYTESUR NEGATIVE 05/07/2017 1418   Sepsis Labs Invalid input(s): PROCALCITONIN,  WBC,  LACTICIDVEN Microbiology Recent Results (from the past 240 hour(s))  Culture, blood (Routine X 2) w Reflex to ID Panel     Status: None (Preliminary result)   Collection Time: 08/12/17  7:09 PM  Result Value Ref Range Status   Specimen Description   Final    BLOOD RIGHT ARM Performed at [facility], [place] [address] [place], [place] [address]    Special  Requests   Final    BOTTLES DRAWN AEROBIC AND ANAEROBIC Blood Culture adequate volume   Culture   Final    NO GROWTH < 24 HOURS Performed at [facility], [place] [address], [place], [place] [address]    Report Status PENDING  Incomplete  Culture, blood (Routine X 2) w Reflex to ID Panel     Status: None (Preliminary result)   Collection Time: 08/12/17  7:15 PM  Result Value Ref Range Status   Specimen Description   Final    BLOOD RIGHT ARM Performed at [facility] [address] [place], [place] [address]    Special Requests   Final    BOTTLES DRAWN AEROBIC ONLY Blood Culture results may not be optimal due to an inadequate volume of blood received in culture bottles   Culture   Final    NO GROWTH < 24 HOURS Performed at [facility] [address] [place],  [address]    Report Status PENDING  Incomplete     Time coordinating discharge: Over 30 minutes  SIGNED:   Marene Lenz, MD  [facility] 08/13/2017, 12:16 PM Pager [phone]  If 7PM-7AM, please contact night-coverage [email or web address] Password [email or web address]

## 2017-08-13 NOTE — Progress Notes (Signed)
Patient Adult Children to transport CSW confirmed with Lelon Frohlich at facility patient able to return.  FL2 done. Med placed.  Nurse given number to call report.   Kathrin Greathouse, Latanya Presser, MSW Clinical Social Worker  [phone] 08/13/2017  12:58 PM

## 2017-08-17 LAB — CULTURE, BLOOD (ROUTINE X 2)
CULTURE: NO GROWTH
Culture: NO GROWTH
Special Requests: ADEQUATE

## 2017-08-18 ENCOUNTER — Ambulatory Visit: Payer: Medicare Other | Admitting: Internal Medicine

## 2017-08-18 ENCOUNTER — Encounter: Payer: Self-pay | Admitting: Internal Medicine

## 2017-08-18 VITALS — BP 100/60 | HR 72 | Ht 61.0 in | Wt 133.8 lb

## 2017-08-18 DIAGNOSIS — K2100 Gastro-esophageal reflux disease with esophagitis, without bleeding: Secondary | ICD-10-CM

## 2017-08-18 DIAGNOSIS — K449 Diaphragmatic hernia without obstruction or gangrene: Secondary | ICD-10-CM

## 2017-08-18 DIAGNOSIS — D500 Iron deficiency anemia secondary to blood loss (chronic): Secondary | ICD-10-CM

## 2017-08-18 MED ORDER — FERROUS SULFATE 300 (60 FE) MG/5ML PO SYRP: 300.0000 mg | ORAL_SOLUTION | Freq: Every day | ORAL | 11 refills | Status: AC

## 2017-08-18 MED ORDER — SUCRALFATE 1 GM/10ML PO SUSP: 1.0000 g | Freq: Four times a day (QID) | ORAL | 11 refills | Status: AC

## 2017-08-18 NOTE — Progress Notes (Signed)
Shelly Ross 84 y.o. [birth year] [id number]  Assessment & Plan:   Encounter Diagnoses  Name Primary?  . Reflux esophagitis Yes  . Hiatal hernia   . Iron deficiency anemia due to chronic blood loss     This is a difficult situation she is on fairly maximal therapy, I do not think she is a good candidate to use Reglan though we can hold that in reserve.  From talking to the family she does not spend a lot of time lying down.  That can aggravate reflux and make things worse.  I do not think she needs repeat investigation right now, I will see if we cannot get liquid Carafate approved because she cannot tolerate the large tablets.  Her MCV has declined along with her hemoglobin and I am sure she has some chronic blood loss anemia related to her upper GI problems and will start liquid ferrous sulfate 1 dose daily.  Hopefully she will tolerate that.  Another option would be Feraheme or other parenteral iron treatment.  Will defer to primary care/nursing home care physicians.  May be she needs to go back on running tramadol may be that pain she has where her shoulder was in other places is triggering the nausea and vomiting I do not think Tylenol is necessarily.  Again she is 45 and demented and I think that limits things somewhat though if she needs further investigation we can do that depending upon the clinical course.  At this time I will see her as needed.   I appreciate the opportunity to care for this patient. CC: Norins, Heinz Knuckles, MD    Subjective:   Chief Complaint: Hematemesis, vomiting GERD  HPI This elderly demented white woman is here with family members, she has had intermittent vomiting since late last year some with streaky hematemesis and coffee grounds, she has been in the hospital twice now, she is also anemic.  I did an endoscopy in January that showed grade C erosive esophagitis and a 6 cm hiatal hernia, she has been treated with twice daily PPI and Carafate.  There  is been difficulty due to prior authorization to get liquid Carafate.  Family members, her son and daughter-in-law tell me that she had been on regular tramadol before these problems started but somehow it got changed to as needed with running Tylenol and they wonder if that has something to do with her nausea and vomiting issues.  Since being home from the hospital, where she spent a few days this month, she has had perhaps one episode of regurgitation or vomiting.  Some of these episodes might be regurgitation.  She has significant dementia she is in the [facility] memory unit, and is really unable to participate in the history.    CBC Latest Ref Rng & Units 08/13/2017 08/12/2017 06/15/2017  WBC 4.0 - 10.5 K/uL 6.2 7.3 6.5  Hemoglobin 12.0 - 15.0 g/dL 8.1(L) 8.1(L) 11.0(L)  Hematocrit 36.0 - 46.0 % 27.3(L) 27.4(L) 34.9(L)  Platelets 150 - 400 K/uL 411(H) 423(H) 447.0(H)  MCV      70   72   81  Allergies  Allergen Reactions  . Lansoprazole Nausea Only and Other (See Comments)    Reaction:  Headaches   . Percocet [Oxycodone-Acetaminophen] Nausea And Vomiting  . Aricept [Donepezil Hcl]     Unknown reaction per [facility]   . Exelon [Rivastigmine Tartrate]     Unknown reaction per MAR   . Namenda [Memantine]     Unknown reaction  per [facility]    Current Meds  Medication Sig  . amLODipine (NORVASC) 5 MG tablet Take 1 tablet (5 mg total) by mouth daily.  . busPIRone (BUSPAR) 5 MG tablet Take 15 mg by mouth 2 (two) times daily.  . Cranberry 450 MG TABS Take 1 tablet by mouth daily with breakfast.  . levofloxacin (LEVAQUIN) 500 MG tablet Take 1 tablet (500 mg total) by mouth daily.  Marland Kitchen levothyroxine (SYNTHROID, LEVOTHROID) 75 MCG tablet Take 37.5-75 mcg by mouth daily before breakfast. Takes 37.5 mcg every day except 75 mcg on Wednesday  . pantoprazole (PROTONIX) 40 MG tablet Take 1 tablet (40 mg total) by mouth 2 (two) times daily before a meal.  . polyethylene glycol (MIRALAX) packet Take 17 g by mouth  daily.  . potassium chloride 20 MEQ TBCR Take 20 mEq by mouth daily for 7 days.  Marland Kitchen senna (SENOKOT) 8.6 MG TABS tablet Take 2 tablets (17.2 mg total) by mouth at bedtime.  . tolterodine (DETROL LA) 4 MG 24 hr capsule Take 4 mg by mouth daily with breakfast.   . traMADol (ULTRAM) 50 MG tablet Take 1 tablet (50 mg total) by mouth every 6 (six) hours as needed.  . [DISCONTINUED] sucralfate (CARAFATE) 1 g tablet Take 1 tablet (1 g total) by mouth 4 (four) times daily -  with meals and at bedtime.   Past Medical History:  Diagnosis Date  . Adenomatous colon polyp 2003  . Degenerative disc disease    CERVICAL SPINE,W/RADICULOPATHY  . Dementia   . GERD (gastroesophageal reflux disease)   . Headache(784.0)    not migraines, but a lot of headaches  . Hyperlipidemia   . Hypertension   . Hypothyroidism   . Osteoporosis   . Premature ventricular contractions 2006  . Rib fractures   . Stricture and stenosis of esophagus 2007  . SVT (supraventricular tachycardia) ([place])    S/P ablation 2005   Past Surgical History:  Procedure Laterality Date  . ABDOMINAL HYSTERECTOMY     WITH OOPHORECTOMY,? BILATERAL FOR ENDOMETRIOSIS  . BLADDER TACKING    . BREAST ENHANCEMENT SURGERY    . CARDIAC ELECTROPHYSIOLOGY MAPPING AND ABLATION    . CATARACT EXTRACTION Bilateral   . COLONOSCOPY    . ENDOVENOUS ABLATION SAPHENOUS VEIN W/ LASER  07-13-2011   right greater saphenous vein, Dr Mamie Nick  . ENDOVENOUS ABLATION SAPHENOUS VEIN W/ LASER  08/2011   L ; Dr Kellie Simmering  . esophageal dilation      X 1  . G2 P2    . PERMANENT PACEMAKER INSERTION N/A 04/19/2013   MDT Adapta L iimplanted by Dr Rayann Heman for mobitz II second degree AV block  . TOTAL KNEE ARTHROPLASTY Right 04/17/2013   Procedure: RIGHT TOTAL KNEE ARTHROPLASTY, CORTISONE INJECTION LEFT KNEE;  Surgeon: Gearlean Alf, MD;  Location: [facility];  Service: Orthopedics;  Laterality: Right;   Social History   Social History Narrative      Pt lives in  assisted living.    Caffeine Use- Very little.   family history includes Alzheimer's disease in her brother and sister; Aneurysm in her brother; COPD in her sister; Cancer in her paternal aunt; Deep vein thrombosis in her brother; Diabetes in her brother; Heart Problems in her unknown relative; Heart attack in her father; Heart attack (age of onset: 70) in her sister; Stroke (age of onset: 8) in her mother; Tuberculosis in her brother.   Review of Systems As per HPI  Objective:  Physical Exam BP 100/60   Pulse 72   Ht 5\' 1"  (1.549 m)   Wt 133 lb 12.8 oz (60.7 kg)   BMI 25.28 kg/m  Quiet No acute distress She is unable to answer questions with reliable memory   25 minutes time spent with patient > half in counseling coordination of care

## 2017-08-18 NOTE — Patient Instructions (Signed)
  Please talk with your primary care Dr about pain management.   We are giving you a letter to give Shelly Ross in regards to your carafate medicine.   We are giving you printed rx's for liquid iron and also for carafate.   I appreciate the opportunity to care for you. Silvano Rusk, MD, [facility]

## 2017-08-19 ENCOUNTER — Ambulatory Visit (INDEPENDENT_AMBULATORY_CARE_PROVIDER_SITE_OTHER): Payer: Medicare Other | Admitting: *Deleted

## 2017-08-19 DIAGNOSIS — I441 Atrioventricular block, second degree: Secondary | ICD-10-CM

## 2017-08-20 ENCOUNTER — Encounter: Payer: Self-pay | Admitting: Cardiology

## 2017-08-20 NOTE — Progress Notes (Signed)
Remote pacemaker transmission.   

## 2017-08-23 ENCOUNTER — Encounter: Payer: Self-pay | Admitting: Internal Medicine

## 2017-08-23 ENCOUNTER — Ambulatory Visit: Payer: Medicare Other | Admitting: Internal Medicine

## 2017-08-23 VITALS — BP 128/74 | HR 55 | Ht 62.0 in | Wt 125.0 lb

## 2017-08-23 DIAGNOSIS — I441 Atrioventricular block, second degree: Secondary | ICD-10-CM | POA: Diagnosis not present

## 2017-08-23 DIAGNOSIS — Z950 Presence of cardiac pacemaker: Secondary | ICD-10-CM

## 2017-08-23 DIAGNOSIS — I10 Essential (primary) hypertension: Secondary | ICD-10-CM | POA: Diagnosis not present

## 2017-08-23 DIAGNOSIS — I442 Atrioventricular block, complete: Secondary | ICD-10-CM

## 2017-08-23 NOTE — Progress Notes (Signed)
PCP: [name], MD Primary Cardiologist: Primary EP:  Dr Tollie Eth is a 84 y.o. female who presents today for routine electrophysiology followup.  Since last being seen in our clinic, the patient reports doing reasonably well.  She has had issues with anemia and UGI bleeding over the past year.  She also has mild dementia. She has repeated falls.  She lives at [facility]. Today, she denies symptoms of palpitations, chest pain, shortness of breath,  lower extremity edema, dizziness, presyncope, or syncope.  The patient is otherwise without complaint today.   Past Medical History:  Diagnosis Date  . Adenomatous colon polyp 2003  . Degenerative disc disease    CERVICAL SPINE,W/RADICULOPATHY  . Dementia   . GERD (gastroesophageal reflux disease)   . Headache(784.0)    not migraines, but a lot of headaches  . Hyperlipidemia   . Hypertension   . Hypothyroidism   . Osteoporosis   . Premature ventricular contractions 2006  . Rib fractures   . Stricture and stenosis of esophagus 2007  . SVT (supraventricular tachycardia) ([place])    S/P ablation 2005   Past Surgical History:  Procedure Laterality Date  . ABDOMINAL HYSTERECTOMY     WITH OOPHORECTOMY,? BILATERAL FOR ENDOMETRIOSIS  . BLADDER TACKING    . BREAST ENHANCEMENT SURGERY    . CARDIAC ELECTROPHYSIOLOGY MAPPING AND ABLATION    . CATARACT EXTRACTION Bilateral   . COLONOSCOPY    . ENDOVENOUS ABLATION SAPHENOUS VEIN W/ LASER  07-13-2011   right greater saphenous vein, Dr Mamie Nick  . ENDOVENOUS ABLATION SAPHENOUS VEIN W/ LASER  08/2011   L ; Dr Kellie Simmering  . esophageal dilation      X 1  . G2 P2    . PERMANENT PACEMAKER INSERTION N/A 04/19/2013   MDT Adapta L iimplanted by Dr Rayann Heman for mobitz II second degree AV block  . TOTAL KNEE ARTHROPLASTY Right 04/17/2013   Procedure: RIGHT TOTAL KNEE ARTHROPLASTY, CORTISONE INJECTION LEFT KNEE;  Surgeon: Gearlean Alf, MD;  Location: [facility];  Service: Orthopedics;   Laterality: Right;    ROS- all systems are reviewed and negative except as per HPI above  Current Outpatient Medications  Medication Sig Dispense Refill  . amLODipine (NORVASC) 5 MG tablet Take 1 tablet (5 mg total) by mouth daily. 30 tablet 0  . busPIRone (BUSPAR) 5 MG tablet Take 15 mg by mouth 2 (two) times daily.    . Cranberry 450 MG TABS Take 1 tablet by mouth daily with breakfast.    . ferrous sulfate 300 (60 Fe) MG/5ML syrup Take 5 mLs (300 mg total) by mouth daily. 150 mL 11  . levothyroxine (SYNTHROID, LEVOTHROID) 75 MCG tablet Take 37.5-75 mcg by mouth daily before breakfast. Takes 37.5 mcg every day except 75 mcg on Wednesday  2  . pantoprazole (PROTONIX) 40 MG tablet Take 1 tablet (40 mg total) by mouth 2 (two) times daily before a meal. 60 tablet 3  . polyethylene glycol (MIRALAX) packet Take 17 g by mouth daily. 14 each 0  . potassium chloride SA (K-DUR,KLOR-CON) 20 MEQ tablet Take 1 tablet by mouth daily.  0  . senna (SENOKOT) 8.6 MG TABS tablet Take 2 tablets (17.2 mg total) by mouth at bedtime. 120 each 0  . sucralfate (CARAFATE) 1 GM/10ML suspension Take 10 mLs (1 g total) by mouth 4 (four) times daily. 1240 mL 11  . tolterodine (DETROL LA) 4 MG 24 hr capsule Take 4 mg by  mouth daily with breakfast.   0  . traMADol (ULTRAM) 50 MG tablet Take 50 mg by mouth every 6 (six) hours as needed for moderate pain.    . potassium chloride 20 MEQ TBCR Take 20 mEq by mouth daily for 7 days. 7 tablet 0   No current facility-administered medications for this visit.     Physical Exam: Vitals:   08/23/17 1003  BP: 128/74  Pulse: (!) 55  SpO2: 99%  Weight: 125 lb (56.7 kg)  Height: 5\' 2"  (1.575 m)    GEN- The patient is elderly appearing, alert  Head- normocephalic, atraumatic Eyes-  Sclera clear, conjunctiva pink Ears- hearing intact Oropharynx- clear Lungs- Clear to ausculation bilaterally, normal work of breathing Chest- pacemaker pocket is well healed Heart- Regular rate  and rhythm, no murmurs, rubs or gallops, PMI not laterally displaced GI- soft, NT, ND, + BS Extremities- no clubbing, cyanosis, or edema  Pacemaker interrogation- reviewed in detail today,  See PACEART report   Assessment and Plan:  1. Symptomatic  complete heart block Normal pacemaker function See Pace Art report No changes today  2. HTN Stable No change required today  3. Anemia/ Reflux recently hospitalized for this folllowed by Dr Carlean Purl  carelink Return to see EP PA in a year  Thompson Grayer MD, [facility] 08/23/2017 10:09 AM

## 2017-08-23 NOTE — Patient Instructions (Addendum)
Medication Instructions:  Your physician recommends that you continue on your current medications as directed. Please refer to the Current Medication list given to you today.  Labwork: None ordered.  Testing/Procedures: None ordered.  Follow-Up: Your physician wants you to follow-up in: one year with Tommye Standard, PA.   You will receive a reminder letter in the mail two months in advance. If you don't receive a letter, please call our office to schedule the follow-up appointment.  Remote monitoring is used to monitor your Pacemaker from home. This monitoring reduces the number of office visits required to check your device to one time per year. It allows [place] to keep an eye on the functioning of your device to ensure it is working properly. You are scheduled for a device check from home on 11/18/2017. You may send your transmission at any time that day. If you have a wireless device, the transmission will be sent automatically. After your physician reviews your transmission, you will receive a postcard with your next transmission date.  Any Other Special Instructions Will Be Listed Below (If Applicable).  If you need a refill on your cardiac medications before your next appointment, please call your pharmacy.

## 2017-09-01 ENCOUNTER — Telehealth: Payer: Self-pay | Admitting: Internal Medicine

## 2017-09-02 ENCOUNTER — Telehealth: Payer: Self-pay | Admitting: Internal Medicine

## 2017-09-02 NOTE — Telephone Encounter (Signed)
Good news.

## 2017-09-02 NOTE — Telephone Encounter (Signed)
Patients insurance calling to let us know medication carafate has been approved. FYI

## 2017-09-02 NOTE — Telephone Encounter (Signed)
Form signed by Dr Carlean Purl and faxed back to [name] at fax # [phone]. I called and told [name]. She said thanks and that she will send it on to hopefully get carafate covered.

## 2017-09-10 LAB — CUP PACEART REMOTE DEVICE CHECK
Brady Statistic AP VP Percent: 13 %
Brady Statistic AP VS Percent: 0 %
Brady Statistic AS VS Percent: 1 %
Date Time Interrogation Session: 20190321175917
Implantable Lead Implant Date: 20141119
Implantable Lead Location: 753859
Lead Channel Impedance Value: 652 Ohm
Lead Channel Pacing Threshold Amplitude: 0.625 V
Lead Channel Pacing Threshold Pulse Width: 0.4 ms
Lead Channel Setting Sensing Sensitivity: 5.6 mV
MDC IDC LEAD IMPLANT DT: 20141119
MDC IDC LEAD LOCATION: 753860
MDC IDC MSMT BATTERY IMPEDANCE: 250 Ohm
MDC IDC MSMT BATTERY REMAINING LONGEVITY: 109 mo
MDC IDC MSMT BATTERY VOLTAGE: 2.79 V
MDC IDC MSMT LEADCHNL RA IMPEDANCE VALUE: 540 Ohm
MDC IDC MSMT LEADCHNL RA PACING THRESHOLD AMPLITUDE: 0.75 V
MDC IDC MSMT LEADCHNL RV PACING THRESHOLD PULSEWIDTH: 0.4 ms
MDC IDC PG IMPLANT DT: 20141119
MDC IDC SET LEADCHNL RA PACING AMPLITUDE: 2 V
MDC IDC SET LEADCHNL RV PACING AMPLITUDE: 2.5 V
MDC IDC SET LEADCHNL RV PACING PULSEWIDTH: 0.4 ms
MDC IDC STAT BRADY AS VP PERCENT: 86 %

## 2017-09-30 ENCOUNTER — Other Ambulatory Visit: Payer: Self-pay

## 2017-09-30 ENCOUNTER — Encounter (HOSPITAL_COMMUNITY): Payer: Medicare Other

## 2017-09-30 DIAGNOSIS — M7989 Other specified soft tissue disorders: Secondary | ICD-10-CM

## 2017-10-07 ENCOUNTER — Ambulatory Visit (HOSPITAL_COMMUNITY)
Admission: RE | Admit: 2017-10-07 | Discharge: 2017-10-07 | Disposition: A | Discharge status: Home / Self Care | Payer: Medicare Other | Source: Ambulatory Visit | Attending: Vascular Surgery | Admitting: Vascular Surgery

## 2017-10-07 DIAGNOSIS — M7989 Other specified soft tissue disorders: Secondary | ICD-10-CM | POA: Insufficient documentation

## 2017-10-10 ENCOUNTER — Encounter (HOSPITAL_COMMUNITY): Payer: Self-pay | Admitting: Emergency Medicine

## 2017-10-10 ENCOUNTER — Emergency Department (HOSPITAL_COMMUNITY): Payer: Medicare Other

## 2017-10-10 ENCOUNTER — Inpatient Hospital Stay (HOSPITAL_COMMUNITY)
Admission: EM | Admit: 2017-10-10 | Discharge: 2017-10-30 | DRG: 871 | Disposition: E | Payer: Medicare Other | Attending: Family Medicine | Admitting: Family Medicine

## 2017-10-10 DIAGNOSIS — E039 Hypothyroidism, unspecified: Secondary | ICD-10-CM | POA: Diagnosis not present

## 2017-10-10 DIAGNOSIS — R748 Abnormal levels of other serum enzymes: Secondary | ICD-10-CM | POA: Diagnosis present

## 2017-10-10 DIAGNOSIS — I509 Heart failure, unspecified: Secondary | ICD-10-CM | POA: Diagnosis present

## 2017-10-10 DIAGNOSIS — R6521 Severe sepsis with septic shock: Secondary | ICD-10-CM | POA: Diagnosis not present

## 2017-10-10 DIAGNOSIS — A419 Sepsis, unspecified organism: Secondary | ICD-10-CM | POA: Diagnosis not present

## 2017-10-10 DIAGNOSIS — I110 Hypertensive heart disease with heart failure: Secondary | ICD-10-CM | POA: Diagnosis present

## 2017-10-10 DIAGNOSIS — Z885 Allergy status to narcotic agent status: Secondary | ICD-10-CM | POA: Diagnosis not present

## 2017-10-10 DIAGNOSIS — Z8701 Personal history of pneumonia (recurrent): Secondary | ICD-10-CM

## 2017-10-10 DIAGNOSIS — Z888 Allergy status to other drugs, medicaments and biological substances status: Secondary | ICD-10-CM

## 2017-10-10 DIAGNOSIS — R7989 Other specified abnormal findings of blood chemistry: Secondary | ICD-10-CM

## 2017-10-10 DIAGNOSIS — F0390 Unspecified dementia without behavioral disturbance: Secondary | ICD-10-CM | POA: Diagnosis present

## 2017-10-10 DIAGNOSIS — Z8249 Family history of ischemic heart disease and other diseases of the circulatory system: Secondary | ICD-10-CM

## 2017-10-10 DIAGNOSIS — Y95 Nosocomial condition: Secondary | ICD-10-CM | POA: Diagnosis present

## 2017-10-10 DIAGNOSIS — J189 Pneumonia, unspecified organism: Secondary | ICD-10-CM | POA: Diagnosis present

## 2017-10-10 DIAGNOSIS — N179 Acute kidney failure, unspecified: Secondary | ICD-10-CM | POA: Diagnosis present

## 2017-10-10 DIAGNOSIS — I442 Atrioventricular block, complete: Secondary | ICD-10-CM | POA: Diagnosis present

## 2017-10-10 DIAGNOSIS — Z96651 Presence of right artificial knee joint: Secondary | ICD-10-CM | POA: Diagnosis not present

## 2017-10-10 DIAGNOSIS — Z515 Encounter for palliative care: Secondary | ICD-10-CM | POA: Diagnosis not present

## 2017-10-10 DIAGNOSIS — Z950 Presence of cardiac pacemaker: Secondary | ICD-10-CM | POA: Diagnosis not present

## 2017-10-10 DIAGNOSIS — K219 Gastro-esophageal reflux disease without esophagitis: Secondary | ICD-10-CM | POA: Diagnosis not present

## 2017-10-10 DIAGNOSIS — J9601 Acute respiratory failure with hypoxia: Secondary | ICD-10-CM | POA: Diagnosis present

## 2017-10-10 DIAGNOSIS — Z66 Do not resuscitate: Secondary | ICD-10-CM | POA: Diagnosis not present

## 2017-10-10 DIAGNOSIS — R0603 Acute respiratory distress: Secondary | ICD-10-CM | POA: Diagnosis present

## 2017-10-10 DIAGNOSIS — Z79890 Hormone replacement therapy (postmenopausal): Secondary | ICD-10-CM | POA: Diagnosis not present

## 2017-10-10 DIAGNOSIS — R778 Other specified abnormalities of plasma proteins: Secondary | ICD-10-CM | POA: Diagnosis present

## 2017-10-10 DIAGNOSIS — J9600 Acute respiratory failure, unspecified whether with hypoxia or hypercapnia: Secondary | ICD-10-CM

## 2017-10-10 HISTORY — DX: Heart failure, unspecified: I50.9

## 2017-10-10 LAB — I-STAT TROPONIN, ED: TROPONIN I, POC: 11.9 ng/mL — AB (ref 0.00–0.08)

## 2017-10-10 NOTE — ED Provider Notes (Signed)
[facility] DEPT Provider Note   CSN: [id number] Arrival date & time: 10/08/2017  2324  Time seen 23:22 PM    History   Chief Complaint Chief Complaint  Patient presents with  . Respiratory Distress   Level 5 caveat for dementia  HPI Shelly Ross is a 84 y.o. female.  HPI EMS states they picked up the patient at a residential care facility.  They reported to EMS patient started having respiratory difficulty that is gotten worse over the past several days.  Patient has a history of dementia and was not able to provide any history to EMS.  They report her initial pulse ox her initial respiratory rate was 26 with blood pressure 503 systolic.  They report she had diffuse rales.  They placed her on CPAP and her pulse ox improved to 94%.  They gave her Solu-Medrol 125 mg IV and albuterol 5 mg nebulizer and EMS reports her breathing sees improved.  They also state they were told the patient was DNR however there was no paperwork at the facility.  She also was recently diagnosed with pneumonia and has a history of congestive heart failure.  PCP Norins, Heinz Knuckles, MD   Past Medical History:  Diagnosis Date  . Adenomatous colon polyp 2003  . CHF (congestive heart failure) ([place])   . Degenerative disc disease    CERVICAL SPINE,W/RADICULOPATHY  . Dementia   . GERD (gastroesophageal reflux disease)   . Headache(784.0)    not migraines, but a lot of headaches  . Hyperlipidemia   . Hypertension   . Hypothyroidism   . Osteoporosis   . Premature ventricular contractions 2006  . Rib fractures   . Stricture and stenosis of esophagus 2007  . SVT (supraventricular tachycardia) ([place])    S/P ablation 2005    Patient Active Problem List   Diagnosis Date Noted  . Coffee ground emesis 05/07/2017  . UTI (urinary tract infection) 07/21/2015  . Sepsis secondary to UTI ([place])   . Second degree AV block   . HLD (hyperlipidemia)   . Acute UTI 07/20/2015  . Elevated  lactic acid level 07/20/2015  . Pyelonephritis 07/20/2015  . Nausea and vomiting 07/20/2015  . Fall 05/28/2014  . Multiple rib fractures 05/24/2014  . Hyponatremia 04/21/2013  . Postoperative anemia due to acute blood loss 04/21/2013  . Hypokalemia 04/21/2013  . AV block, 2nd degree 04/18/2013  . OA (osteoarthritis) of knee 04/17/2013  . DJD (degenerative joint disease) of knee 02/15/2013  . Diarrhea 02/10/2013  . Hypercalcemia 01/17/2013  . Memory loss 09/09/2012  . Varicose veins of lower extremities with other complications 88/82/8003  . ANXIETY 07/23/2010  . DEGENERATIVE DISC DISEASE, LUMBAR SPINE 07/09/2009  . EPISTAXIS 07/09/2009  . OSTEOPENIA 02/08/2009  . PALPITATIONS, RECURRENT 10/22/2008  . Essential hypertension 01/12/2008  . PREMATURE VENTRICULAR CONTRACTIONS 01/12/2008  . DIZZINESS 01/12/2008  . Hypothyroidism 07/18/2007  . HYPERLIPIDEMIA 07/18/2007  . Esophagitis 07/18/2007  . DEGENERATIVE DISC DISEASE, CERVICAL SPINE, W/RADICULOPATHY 07/18/2007    Past Surgical History:  Procedure Laterality Date  . ABDOMINAL HYSTERECTOMY     WITH OOPHORECTOMY,? BILATERAL FOR ENDOMETRIOSIS  . BLADDER TACKING    . BREAST ENHANCEMENT SURGERY    . CARDIAC ELECTROPHYSIOLOGY MAPPING AND ABLATION    . CATARACT EXTRACTION Bilateral   . COLONOSCOPY    . ENDOVENOUS ABLATION SAPHENOUS VEIN W/ LASER  07-13-2011   right greater saphenous vein, Dr Mamie Nick  . ENDOVENOUS ABLATION SAPHENOUS VEIN W/ LASER  08/2011  L ; Dr Kellie Simmering  . esophageal dilation      X 1  . G2 P2    . PERMANENT PACEMAKER INSERTION N/A 04/19/2013   MDT Adapta L iimplanted by Dr Rayann Heman for mobitz II second degree AV block  . TOTAL KNEE ARTHROPLASTY Right 04/17/2013   Procedure: RIGHT TOTAL KNEE ARTHROPLASTY, CORTISONE INJECTION LEFT KNEE;  Surgeon: Gearlean Alf, MD;  Location: [facility];  Service: Orthopedics;  Laterality: Right;     OB History   None      Home Medications    Prior to Admission  medications   Medication Sig Start Date End Date Taking? Authorizing Provider  acetaminophen (TYLENOL) 325 MG tablet Take 650 mg by mouth 3 (three) times daily.   Yes [provider]  azithromycin (ZITHROMAX) 250 MG tablet Take 250-500 mg by mouth See admin instructions. 500 mg immediately, 250 mg x 4 days 10/09/17  Yes [provider]  busPIRone (BUSPAR) 5 MG tablet Take 15 mg by mouth 2 (two) times daily.   Yes [provider]  Cranberry 450 MG TABS Take 450 mg by mouth daily with breakfast.    Yes [provider]  ferrous sulfate 220 (44 Fe) MG/5ML solution Take 330 mg by mouth daily.   Yes [provider]  levothyroxine (SYNTHROID, LEVOTHROID) 75 MCG tablet Take 37.5-75 mcg by mouth See admin instructions. Takes 37.5 mcg every day except 75 mcg on Wednesday   Yes [provider]  pantoprazole (PROTONIX) 40 MG tablet Take 1 tablet (40 mg total) by mouth 2 (two) times daily before a meal. 06/15/17  Yes Lemmon, Lavone Nian, PA  polyethylene glycol [facility]) packet Take 17 g by mouth daily. 05/08/17  Yes Ghimire, Henreitta Leber, MD  senna (SENOKOT) 8.6 MG TABS tablet Take 2 tablets (17.2 mg total) by mouth at bedtime. 05/08/17  Yes Ghimire, Henreitta Leber, MD  sucralfate (CARAFATE) 1 GM/10ML suspension Take 10 mLs (1 g total) by mouth 4 (four) times daily. 08/18/17  Yes Gatha Mayer, MD  tolterodine (DETROL LA) 4 MG 24 hr capsule Take 4 mg by mouth daily with breakfast.    Yes [provider]  amLODipine (NORVASC) 5 MG tablet Take 1 tablet (5 mg total) by mouth daily. Patient not taking: Reported on 2017-11-06 05/08/17   Hosie Poisson, MD  ferrous sulfate 300 (60 Fe) MG/5ML syrup Take 5 mLs (300 mg total) by mouth daily. Patient not taking: Reported on 11-06-17 08/18/17   Gatha Mayer, MD    Family History Family History  Problem Relation Age of Onset  . Stroke Mother 41  . Heart attack Father        in 34s  . Deep vein thrombosis  Brother        AND PTE  . Diabetes Brother   . Cancer Paternal Aunt         INTESTINAL   . Alzheimer's disease Sister   . Alzheimer's disease Brother   . Tuberculosis Brother   . Aneurysm Brother        cns  . Heart Problems Unknown   . COPD Sister        smoking  . Heart attack Sister 50    Social History Social History   Tobacco Use  . Smoking status: Never Smoker  . Smokeless tobacco: Never Used  Substance Use Topics  . Alcohol use: No    Comment: wine, seldom   . Drug use: No  lives in a residential  facililty.   Allergies   Lansoprazole; Percocet [oxycodone-acetaminophen]; Aricept [donepezil hcl]; Exelon [rivastigmine tartrate]; and Namenda [memantine]   Review of Systems Review of Systems  Unable to perform ROS: Dementia     Physical Exam Updated Vital Signs BP 93/64   Pulse 92   Temp 98 F (36.7 C) (Axillary)   Resp (!) 27   SpO2 96%   Vital signs normal except for hypotension   Physical Exam  Constitutional: She appears well-developed and well-nourished.  HENT:  Head: Normocephalic.  Right Ear: External ear normal.  Left Ear: External ear normal.  Nose: Nose normal.  Patient has CPAP in place so unable to assess oropharynx  Eyes: Pupils are equal, round, and reactive to light. Conjunctivae and EOM are normal.  Patient does not open her eyes when I speak to her verbally, I had to open her eyelids.  Neck: Normal range of motion.  Cardiovascular: Normal rate and regular rhythm.  Pulmonary/Chest: She is in respiratory distress. She has decreased breath sounds. She has rales.  Abdominal: Soft. Bowel sounds are normal. There is no tenderness. There is no guarding.  Musculoskeletal: She exhibits edema. She exhibits no deformity.  Patient has 1+ pitting edema of both lower extremities  Skin: Skin is warm and dry. There is pallor.  Psychiatric: She is noncommunicative.  Patient does not respond to verbal communication  Nursing note and vitals  reviewed.    ED Treatments / Results  Labs (all labs ordered are listed, but only abnormal results are displayed) Results for orders placed or performed during the hospital encounter of 10/06/2017  CBC with Differential  Result Value Ref Range   WBC 6.2 4.0 - 10.5 K/uL   RBC 4.22 3.87 - 5.11 MIL/uL   Hemoglobin 8.9 (L) 12.0 - 15.0 g/dL   HCT 31.3 (L) 36.0 - 46.0 %   MCV 74.2 (L) 78.0 - 100.0 fL   MCH 21.1 (L) 26.0 - 34.0 pg   MCHC 28.4 (L) 30.0 - 36.0 g/dL   RDW 23.3 (H) 11.5 - 15.5 %   Platelets 330 150 - 400 K/uL   Neutrophils Relative % 88 %   Lymphocytes Relative 5 %   Monocytes Relative 7 %   Eosinophils Relative 0 %   Basophils Relative 0 %   Neutro Abs 5.5 1.7 - 7.7 K/uL   Lymphs Abs 0.3 (L) 0.7 - 4.0 K/uL   Monocytes Absolute 0.4 0.1 - 1.0 K/uL   Eosinophils Absolute 0.0 0.0 - 0.7 K/uL   Basophils Absolute 0.0 0.0 - 0.1 K/uL   RBC Morphology MIXED RBC POPULATION   Comprehensive metabolic panel  Result Value Ref Range   Sodium 134 (L) 135 - 145 mmol/L   Potassium 4.5 3.5 - 5.1 mmol/L   Chloride 103 101 - 111 mmol/L   CO2 16 (L) 22 - 32 mmol/L   Glucose, Bld 126 (H) 65 - 99 mg/dL   BUN 30 (H) 6 - 20 mg/dL   Creatinine, Ser 1.36 (H) 0.44 - 1.00 mg/dL   Calcium 8.9 8.9 - 10.3 mg/dL   Total Protein 6.6 6.5 - 8.1 g/dL   Albumin 3.1 (L) 3.5 - 5.0 g/dL   AST 1,590 (H) 15 - 41 U/L   ALT 1,356 (H) 14 - 54 U/L   Alkaline Phosphatase 99 38 - 126 U/L   Total Bilirubin 1.4 (H) 0.3 - 1.2 mg/dL   GFR calc non Af Amer 33 (L) >60 mL/min   GFR calc Af Amer 38 (L) >60  mL/min   Anion gap 15 5 - 15  Brain natriuretic peptide  Result Value Ref Range   B Natriuretic Peptide 1,281.3 (H) 0.0 - 100.0 pg/mL  Blood gas, venous  Result Value Ref Range   FIO2 40.00    O2 Content PENDING L/min   pH, Ven 7.331 7.250 - 7.430   pCO2, Ven 34.6 (L) 44.0 - 60.0 mmHg   pO2, Ven  32.0 - 45.0 mmHg    RBV DR. Tomi Bamberger BELOW REPORTABLE RANGE BY KELLY JARRELL, RRT AT 0052 ON 2017-11-05    Bicarbonate 17.8 (L) 20.0 - 28.0 mmol/L   Acid-base deficit 6.9 (H) 0.0 - 2.0 mmol/L   O2 Saturation 36.5 %   Patient temperature 98.6   I-stat troponin, ED  Result Value Ref Range   Troponin i, poc 11.90 (HH) 0.00 - 0.08 ng/mL   Comment NOTIFIED PHYSICIAN    Comment 3           Laboratory interpretation all normal except anemia, metabolic acidosis, elevation of lft's, renal insufficiency, elevated BNP, + troponin    EKG EKG Interpretation  Date/Time:  Sunday Oct 10 2017 23:26:18 EDT Ventricular Rate:  97 PR Interval:    QRS Duration: 133 QT Interval:  396 QTC Calculation: 504 R Axis:   -70 Text Interpretation:  Atrial-sensed ventricular-paced rhythm No further analysis attempted due to paced rhythm No significant change since last tracing 07 May 2017 Confirmed by Rolland Porter [phone]) on 10/08/2017 11:55:17 PM   Radiology Dg Chest Port 1 View  Result Date: 11-05-17 CLINICAL DATA:  Difficulty breathing EXAM: PORTABLE CHEST 1 VIEW COMPARISON:  08/12/2017 FINDINGS: Left-sided pacing device as before. Bilateral pleural effusions. Cardiomegaly with vascular congestion and mild to moderate pulmonary edema. Confluent airspace disease at the bases. Aortic atherosclerosis. No pneumothorax IMPRESSION: Bilateral pleural effusions. Vascular congestion with mild to moderate pulmonary edema. Confluent airspace disease at the bases may reflect atelectasis or pneumonia. Electronically Signed   By: Donavan Foil M.D.   On: 11/05/17 00:18    Procedures .Critical Care Performed by: Rolland Porter, MD Authorized by: Rolland Porter, MD   Critical care provider statement:    Critical care time (minutes):  32   Critical care was necessary to treat or prevent imminent or life-threatening deterioration of the following conditions:  Respiratory failure and cardiac failure   Critical care was time spent personally by me on the following activities:  Discussions with consultants, evaluation of patient's  response to treatment, examination of patient, obtaining history from patient or surrogate, ordering and review of laboratory studies, ordering and review of radiographic studies, pulse oximetry and re-evaluation of patient's condition   (including critical care time)  Medications Ordered in ED Medications  acetaminophen (TYLENOL) suppository 650 mg (650 mg Rectal Refused 11-05-2017 0129)  furosemide (LASIX) injection 60 mg (60 mg Intravenous Refused November 05, 2017 0127)  phenylephrine (NEO-SYNEPHRINE) 10 mg in dextrose 5 % 250 mL (0.04 mg/mL) infusion (0 mcg/min Intravenous Refused 11-05-17 0128)  sodium chloride 0.9 % bolus 1,000 mL (1,000 mLs Intravenous New Bag/Given 2017-11-05 0037)    And  sodium chloride 0.9 % bolus 500 mL (0 mLs Intravenous Stopped November 05, 2017 0159)    And  sodium chloride 0.9 % bolus 250 mL (0 mLs Intravenous Stopped 11/05/2017 0159)  ceFEPIme (MAXIPIME) 2 g in sodium chloride 0.9 % 100 mL IVPB (0 g Intravenous Stopped 2017-11-05 0158)  vancomycin (VANCOCIN) IVPB 1000 mg/200 mL premix ( Intravenous Restarted November 05, 2017 0159)     Initial Impression / Assessment and  Plan / ED Course  I have reviewed the triage vital signs and the nursing notes.  Pertinent labs & imaging results that were available during my care of the patient were reviewed by me and considered in my medical decision making (see chart for details).    Pt was placed on BiPap on arrival  1:10 AM family is here, they have some paperwork stating she did not want invasive measures done to prolong her life if she had severe dementia or was terminally ill.  Patient's blood pressure 77/52 pulse ox is 86 to 91% on BiPAP, HR 75. Pt briefly opened eyes once then didn't do it anymore.  Remains on Bipap.   I was going to start patient temporarily on neosynephrine drip, but family has decided to not do that. They understand her prognosis is grim and advised to call in any family that would want to be here. They just want her to be  comfortable.   02:02 AM Dr Harvest Forest, hospitalist, will see patient.    Final Clinical Impressions(s) / ED Diagnoses   Final diagnoses:  Sepsis, due to unspecified organism ([place])  Acute on chronic congestive heart failure, unspecified heart failure type ([place])  Elevated liver enzymes    Plan admission for end of life  Rolland Porter, MD, Barbette Or, MD 10/19/17 [phone]

## 2017-10-10 NOTE — ED Triage Notes (Signed)
Pt brought in from [facility]  Pt has been having difficulty breathing for the past couple days and had had a recent diagnosis of pneumonia   EMS arrived pt had labored respirations with rales in all fields  Pt original sats were 81%  Pt placed on CPAP and sats came up to 94%  Pt was not able to talk but would nod to questions  Pt has hx of dementia  Unclear on pt normal mental status  Pt has a pacemaker and hx of CHF

## 2017-10-11 DIAGNOSIS — Z8249 Family history of ischemic heart disease and other diseases of the circulatory system: Secondary | ICD-10-CM | POA: Diagnosis not present

## 2017-10-11 DIAGNOSIS — I509 Heart failure, unspecified: Secondary | ICD-10-CM | POA: Insufficient documentation

## 2017-10-11 DIAGNOSIS — J189 Pneumonia, unspecified organism: Secondary | ICD-10-CM | POA: Diagnosis not present

## 2017-10-11 DIAGNOSIS — R7989 Other specified abnormal findings of blood chemistry: Secondary | ICD-10-CM

## 2017-10-11 DIAGNOSIS — Z96651 Presence of right artificial knee joint: Secondary | ICD-10-CM | POA: Diagnosis not present

## 2017-10-11 DIAGNOSIS — A419 Sepsis, unspecified organism: Secondary | ICD-10-CM | POA: Diagnosis present

## 2017-10-11 DIAGNOSIS — I110 Hypertensive heart disease with heart failure: Secondary | ICD-10-CM | POA: Diagnosis not present

## 2017-10-11 DIAGNOSIS — R0603 Acute respiratory distress: Secondary | ICD-10-CM | POA: Diagnosis present

## 2017-10-11 DIAGNOSIS — Z950 Presence of cardiac pacemaker: Secondary | ICD-10-CM | POA: Diagnosis not present

## 2017-10-11 DIAGNOSIS — J9600 Acute respiratory failure, unspecified whether with hypoxia or hypercapnia: Secondary | ICD-10-CM

## 2017-10-11 DIAGNOSIS — K219 Gastro-esophageal reflux disease without esophagitis: Secondary | ICD-10-CM | POA: Diagnosis not present

## 2017-10-11 DIAGNOSIS — Z515 Encounter for palliative care: Secondary | ICD-10-CM | POA: Diagnosis not present

## 2017-10-11 DIAGNOSIS — R778 Other specified abnormalities of plasma proteins: Secondary | ICD-10-CM | POA: Diagnosis present

## 2017-10-11 DIAGNOSIS — Z8701 Personal history of pneumonia (recurrent): Secondary | ICD-10-CM | POA: Diagnosis not present

## 2017-10-11 DIAGNOSIS — F0390 Unspecified dementia without behavioral disturbance: Secondary | ICD-10-CM | POA: Diagnosis not present

## 2017-10-11 DIAGNOSIS — I442 Atrioventricular block, complete: Secondary | ICD-10-CM | POA: Diagnosis not present

## 2017-10-11 DIAGNOSIS — R748 Abnormal levels of other serum enzymes: Secondary | ICD-10-CM

## 2017-10-11 DIAGNOSIS — N179 Acute kidney failure, unspecified: Secondary | ICD-10-CM | POA: Diagnosis present

## 2017-10-11 DIAGNOSIS — R6521 Severe sepsis with septic shock: Secondary | ICD-10-CM

## 2017-10-11 DIAGNOSIS — Y95 Nosocomial condition: Secondary | ICD-10-CM | POA: Diagnosis not present

## 2017-10-11 DIAGNOSIS — E039 Hypothyroidism, unspecified: Secondary | ICD-10-CM | POA: Diagnosis not present

## 2017-10-11 DIAGNOSIS — Z66 Do not resuscitate: Secondary | ICD-10-CM | POA: Diagnosis not present

## 2017-10-11 DIAGNOSIS — J9601 Acute respiratory failure with hypoxia: Secondary | ICD-10-CM | POA: Diagnosis not present

## 2017-10-11 LAB — BRAIN NATRIURETIC PEPTIDE: B Natriuretic Peptide: 1281.3 pg/mL — ABNORMAL HIGH (ref 0.0–100.0)

## 2017-10-11 LAB — CBC WITH DIFFERENTIAL/PLATELET
BASOS PCT: 0 %
Basophils Absolute: 0 10*3/uL (ref 0.0–0.1)
EOS PCT: 0 %
Eosinophils Absolute: 0 10*3/uL (ref 0.0–0.7)
HEMATOCRIT: 31.3 % — AB (ref 36.0–46.0)
HEMOGLOBIN: 8.9 g/dL — AB (ref 12.0–15.0)
LYMPHS PCT: 5 %
Lymphs Abs: 0.3 10*3/uL — ABNORMAL LOW (ref 0.7–4.0)
MCH: 21.1 pg — AB (ref 26.0–34.0)
MCHC: 28.4 g/dL — ABNORMAL LOW (ref 30.0–36.0)
MCV: 74.2 fL — AB (ref 78.0–100.0)
Monocytes Absolute: 0.4 10*3/uL (ref 0.1–1.0)
Monocytes Relative: 7 %
NEUTROS ABS: 5.5 10*3/uL (ref 1.7–7.7)
NEUTROS PCT: 88 %
Platelets: 330 10*3/uL (ref 150–400)
RBC: 4.22 MIL/uL (ref 3.87–5.11)
RDW: 23.3 % — ABNORMAL HIGH (ref 11.5–15.5)
WBC: 6.2 10*3/uL (ref 4.0–10.5)

## 2017-10-11 LAB — COMPREHENSIVE METABOLIC PANEL
ALK PHOS: 99 U/L (ref 38–126)
ALT: 1356 U/L — AB (ref 14–54)
ANION GAP: 15 (ref 5–15)
AST: 1590 U/L — ABNORMAL HIGH (ref 15–41)
Albumin: 3.1 g/dL — ABNORMAL LOW (ref 3.5–5.0)
BILIRUBIN TOTAL: 1.4 mg/dL — AB (ref 0.3–1.2)
BUN: 30 mg/dL — ABNORMAL HIGH (ref 6–20)
CALCIUM: 8.9 mg/dL (ref 8.9–10.3)
CO2: 16 mmol/L — AB (ref 22–32)
CREATININE: 1.36 mg/dL — AB (ref 0.44–1.00)
Chloride: 103 mmol/L (ref 101–111)
GFR, EST AFRICAN AMERICAN: 38 mL/min — AB (ref >60)
GFR, EST NON AFRICAN AMERICAN: 33 mL/min — AB (ref >60)
Glucose, Bld: 126 mg/dL — ABNORMAL HIGH (ref 65–99)
Potassium: 4.5 mmol/L (ref 3.5–5.1)
Sodium: 134 mmol/L — ABNORMAL LOW (ref 135–145)
TOTAL PROTEIN: 6.6 g/dL (ref 6.5–8.1)

## 2017-10-11 MED ORDER — GLYCOPYRROLATE 0.2 MG/ML IJ SOLN: 0.2000 mg | INTRAMUSCULAR | Status: DC | PRN

## 2017-10-11 MED ORDER — ONDANSETRON 4 MG PO TBDP: 4.0000 mg | ORAL_TABLET | Freq: Four times a day (QID) | ORAL | Status: DC | PRN

## 2017-10-11 MED ORDER — SODIUM CHLORIDE 0.9 % IV BOLUS (SEPSIS)
250.0000 mL | Freq: Once | INTRAVENOUS | Status: AC
  Administered 2017-10-11: 250 mL via INTRAVENOUS

## 2017-10-11 MED ORDER — MORPHINE SULFATE (PF) 2 MG/ML IV SOLN
1.0000 mg | INTRAVENOUS | Status: DC | PRN
  Administered 2017-10-11 (×2): 1 mg via INTRAVENOUS
  Filled 2017-10-11 (×2): qty 1

## 2017-10-11 MED ORDER — LORAZEPAM 2 MG/ML IJ SOLN
1.0000 mg | INTRAMUSCULAR | Status: DC | PRN
  Administered 2017-10-11: 1 mg via INTRAVENOUS
  Filled 2017-10-11: qty 1

## 2017-10-11 MED ORDER — PHENYLEPHRINE HCL 10 MG/ML IJ SOLN
0.0000 ug/min | Freq: Once | INTRAVENOUS | Status: DC
  Filled 2017-10-11: qty 1

## 2017-10-11 MED ORDER — SODIUM CHLORIDE 0.9 % IV BOLUS (SEPSIS)
500.0000 mL | Freq: Once | INTRAVENOUS | Status: AC
  Administered 2017-10-11: 500 mL via INTRAVENOUS

## 2017-10-11 MED ORDER — LORAZEPAM 2 MG/ML PO CONC: 1.0000 mg | ORAL | Status: DC | PRN

## 2017-10-11 MED ORDER — SODIUM CHLORIDE 0.9 % IV BOLUS (SEPSIS)
1000.0000 mL | Freq: Once | INTRAVENOUS | Status: AC
  Administered 2017-10-11: 1000 mL via INTRAVENOUS

## 2017-10-11 MED ORDER — GLYCOPYRROLATE 1 MG PO TABS
1.0000 mg | ORAL_TABLET | ORAL | Status: DC | PRN
  Filled 2017-10-11: qty 1

## 2017-10-11 MED ORDER — MORPHINE SULFATE (PF) 2 MG/ML IV SOLN
2.0000 mg | INTRAVENOUS | Status: AC
  Administered 2017-10-11: 2 mg via INTRAVENOUS
  Filled 2017-10-11: qty 1

## 2017-10-11 MED ORDER — SODIUM CHLORIDE 0.9 % IV SOLN
2.0000 g | Freq: Once | INTRAVENOUS | Status: AC
  Administered 2017-10-11: 2 g via INTRAVENOUS
  Filled 2017-10-11: qty 2

## 2017-10-11 MED ORDER — FUROSEMIDE 10 MG/ML IJ SOLN: 60.0000 mg | Freq: Once | INTRAMUSCULAR | Status: DC

## 2017-10-11 MED ORDER — ACETAMINOPHEN 650 MG RE SUPP: 650.0000 mg | Freq: Four times a day (QID) | RECTAL | Status: DC | PRN

## 2017-10-11 MED ORDER — ACETAMINOPHEN 325 MG PO TABS: 650.0000 mg | ORAL_TABLET | Freq: Four times a day (QID) | ORAL | Status: DC | PRN

## 2017-10-11 MED ORDER — VANCOMYCIN HCL IN DEXTROSE 1-5 GM/200ML-% IV SOLN
1000.0000 mg | Freq: Once | INTRAVENOUS | Status: AC
  Administered 2017-10-11: 1000 mg via INTRAVENOUS
  Filled 2017-10-11: qty 200

## 2017-10-11 MED ORDER — DIPHENHYDRAMINE HCL 50 MG/ML IJ SOLN: 12.5000 mg | INTRAMUSCULAR | Status: DC | PRN

## 2017-10-11 MED ORDER — ACETAMINOPHEN 650 MG RE SUPP
650.0000 mg | Freq: Once | RECTAL | Status: DC
  Filled 2017-10-11: qty 1

## 2017-10-11 MED ORDER — ONDANSETRON HCL 4 MG/2ML IJ SOLN: 4.0000 mg | Freq: Four times a day (QID) | INTRAMUSCULAR | Status: DC | PRN

## 2017-10-11 MED ORDER — ONDANSETRON HCL 4 MG/2ML IJ SOLN
4.0000 mg | INTRAMUSCULAR | Status: AC
  Administered 2017-10-11: 4 mg via INTRAVENOUS
  Filled 2017-10-11: qty 2

## 2017-10-11 MED ORDER — LORAZEPAM 1 MG PO TABS
1.0000 mg | ORAL_TABLET | ORAL | Status: DC | PRN
Start: 2017-10-11 — End: 2017-10-11

## 2017-10-16 LAB — CULTURE, BLOOD (ROUTINE X 2)
CULTURE: NO GROWTH
Special Requests: ADEQUATE

## 2017-10-21 ENCOUNTER — Encounter: Payer: Medicare Other | Admitting: Vascular Surgery

## 2017-10-30 NOTE — ED Notes (Signed)
Entered room checked v/s provider notified

## 2017-10-30 NOTE — ED Notes (Signed)
V/s terminated

## 2017-10-30 NOTE — Progress Notes (Signed)
Entered room to check bipap and patient was off and on a [place].  Asked if patient was remaining off the bipap, the family replied that she was.

## 2017-10-30 NOTE — Progress Notes (Signed)
A consult was received from an ED physician for Vancomycin, cefepime per pharmacy dosing.  The patient's profile has been reviewed for ht/wt/allergies/indication/available labs.   A one time order has been placed for Vancomycin 1gm iv x1, and Cefepime 2gm iv x1.  Further antibiotics/pharmacy consults should be ordered by admitting physician if indicated.                       Thank you, Nani Skillern Crowford Oct 15, 2017  12:16 AM

## 2017-10-30 NOTE — ED Notes (Signed)
Family refusing to continue care at this time.  Attempted to get labs but family saying stop.  Provider notified.

## 2017-10-30 NOTE — Death Summary Note (Signed)
Shelly Ross AVW:[id number] DOB: 1924-11-17 DOA: 2017-10-19  PCP: [name], MD  Admit date: 10-19-2017 Date of Death: 10-20-17  Final Diagnoses:  Principal Problem: Septic shock ([place])  Secondary problems: Acute respiratory failure with hypoxia Healthcare-associated pneumonia Congestive heart failure exacerbation Elevated troponin AKI (acute kidney injury) ([place]) Elevated liver enzyme Presence of cardiac pacemaker Comfort measures only status      History of present illness:  Shelly Ross is a 84 y.o. female with medical history significant of HTN, HLD, CAD, CHF, SVT s/p ablation, complete heart block s/p PM, and dementia; who presents with complaints of acute respiratory distress.  History is obtained from the patient's family was present at bedside due to history of dementia and acute respiratory distress.  Patient had recently diagnosed with pneumonia, but and had progressively worsening shortness of breath over the last several days.  Upon EMS arrival patient O2 sats were noted to be 81% and patient was placed to CPAP with improvement O2 sats greater than 94%.     Hospital Course:   On admission to the emergency department patient was noted to be febrile up to 101.3 F, t respirations 20-28, blood pressure is low as 70/50, O2 saturations 86% to 96% after being placed on BiPAP.  Labs revealed WBC 6.2, hemoglobin 8.9, BUN 30, creatinine 1.36, AST 1519, ALT 1356, total bilirubin 1.4, BNP 1291.3, troponin 11.9.  Venous blood gas revealed a pH of 7.33, PCO2 34.6.  Sepsis protocol and initially started with empiric antibiotics of V vancomycin and cefepime with at least 1.75  L of IV fluids ordered.  Patient was being prepared to be started on Neo-Synephrine for blood pressure, but family declined lactic acid.  BiPAP was initially continued, but patient became more alert and agitated with the family family taking off 14.  TRH called to admit notified of family wanting more  comfort measures.  Discussed with family regarding patient's poor prognosis.  Family would like to discontinue all aggressive therapies at this time and keep the patient comfortable with morphine for pain, Ativan for anxiety, and nasal cannula oxygen with the knowledge that this could mean eminent death for the patient.  All pending studies studies and blood draws were discontinued and comfort measures were placed.  6:41 AM the patient was noted to have no respirations or palpable pulse and patient was pronounced.  Patient's pacemaker was demagnetized at this time.   Time: 6:41  Signed:  Norval Morton  [facility] 10/20/17, 7:03 AM

## 2017-10-30 NOTE — H&P (Addendum)
History and Physical    Shelly Ross IZT:[id number] DOB: Apr 27, 1925 DOA: 09/30/2017  Referring MD/NP/PA: Dr. Rolland Porter PCP: [name], MD  Patient coming from: [facility] via EMS  Chief Complaint: Respiratory distress  I have personally briefly reviewed patient's old medical records in [place]   HPI: Shelly Ross is a 84 y.o. female with medical history significant of HTN, HLD, CAD, CHF, SVT s/p ablation, complete heart block s/p PM, and dementia; who presents with complaints of acute respiratory distress.  History is obtained from the patient's family was present at bedside due to history of dementia and acute respiratory distress.  Patient had recently diagnosed with pneumonia, but and had progressively worsening shortness of breath over the last several days.  Upon EMS arrival patient O2 sats were noted to be 81% and patient was placed to CPAP with improvement O2 sats greater than 94%.  ED Course: On admission to the emergency department patient was noted to be febrile up to 101.3 F, t respirations 20-28, blood pressure is low as 70/50, O2 saturations 86% to 96% after being placed on BiPAP.  Labs revealed WBC 6.2, hemoglobin 8.9, BUN 30, creatinine 1.36, AST 1519, ALT 1356, total bilirubin 1.4, BNP 1291.3, troponin 11.9.  Venous blood gas revealed a pH of 7.33, PCO2 34.6.  Sepsis protocol and initially started with empiric antibiotics of Vancomycin and cefepime with at least 1.75  L of IV fluids ordered.  Patient was being prepared to be started on Neo-Synephrine for blood pressure, but family declined lactic acid.  BiPAP was initially continued, but patient became more alert and agitated with the family family taking off 21.  TRH called to admit notified of family wanting more comfort measures.  Discussed with family regarding patient's poor prognosis.  Family would like to discontinue all aggressive therapies at this time and keep the patient comfortable with morphine,  nasal cannula oxygen, and other measures with the knowledge that this could mean eminent death for the patient.  Review of Systems  Unable to perform ROS: Severe respiratory distress  Respiratory: Positive for shortness of breath.     Past Medical History:  Diagnosis Date  . Adenomatous colon polyp 2003  . CHF (congestive heart failure) ([place])   . Degenerative disc disease    CERVICAL SPINE,W/RADICULOPATHY  . Dementia   . GERD (gastroesophageal reflux disease)   . Headache(784.0)    not migraines, but a lot of headaches  . Hyperlipidemia   . Hypertension   . Hypothyroidism   . Osteoporosis   . Premature ventricular contractions 2006  . Rib fractures   . Stricture and stenosis of esophagus 2007  . SVT (supraventricular tachycardia) ([place])    S/P ablation 2005    Past Surgical History:  Procedure Laterality Date  . ABDOMINAL HYSTERECTOMY     WITH OOPHORECTOMY,? BILATERAL FOR ENDOMETRIOSIS  . BLADDER TACKING    . BREAST ENHANCEMENT SURGERY    . CARDIAC ELECTROPHYSIOLOGY MAPPING AND ABLATION    . CATARACT EXTRACTION Bilateral   . COLONOSCOPY    . ENDOVENOUS ABLATION SAPHENOUS VEIN W/ LASER  07-13-2011   right greater saphenous vein, Dr Mamie Nick  . ENDOVENOUS ABLATION SAPHENOUS VEIN W/ LASER  08/2011   L ; Dr Kellie Simmering  . esophageal dilation      X 1  . G2 P2    . PERMANENT PACEMAKER INSERTION N/A 04/19/2013   MDT Adapta L iimplanted by Dr Rayann Heman for mobitz II second degree AV block  .  TOTAL KNEE ARTHROPLASTY Right 04/17/2013   Procedure: RIGHT TOTAL KNEE ARTHROPLASTY, CORTISONE INJECTION LEFT KNEE;  Surgeon: Gearlean Alf, MD;  Location: [facility] ORS;  Service: Orthopedics;  Laterality: Right;     reports that she has never smoked. She has never used smokeless tobacco. She reports that she does not drink alcohol or use drugs.  Allergies  Allergen Reactions  . Lansoprazole Nausea Only and Other (See Comments)    Reaction:  Headaches   . Percocet [Oxycodone-Acetaminophen]  Nausea And Vomiting  . Aricept [Donepezil Hcl]     Unknown reaction per MAR   . Exelon [Rivastigmine Tartrate]     Unknown reaction per MAR   . Namenda [Memantine]     Unknown reaction per [facility]     Family History  Problem Relation Age of Onset  . Stroke Mother 81  . Heart attack Father        in 72s  . Deep vein thrombosis Brother        AND PTE  . Diabetes Brother   . Cancer Paternal Aunt         INTESTINAL   . Alzheimer's disease Sister   . Alzheimer's disease Brother   . Tuberculosis Brother   . Aneurysm Brother        cns  . Heart Problems Unknown   . COPD Sister        smoking  . Heart attack Sister 33    Prior to Admission medications   Medication Sig Start Date End Date Taking? Authorizing Provider  acetaminophen (TYLENOL) 325 MG tablet Take 650 mg by mouth 3 (three) times daily.   Yes [provider]  azithromycin (ZITHROMAX) 250 MG tablet Take 250-500 mg by mouth See admin instructions. 500 mg immediately, 250 mg x 4 days 10/09/17  Yes [provider]  busPIRone (BUSPAR) 5 MG tablet Take 15 mg by mouth 2 (two) times daily.   Yes [provider]  Cranberry 450 MG TABS Take 450 mg by mouth daily with breakfast.    Yes [provider]  ferrous sulfate 220 (44 Fe) MG/5ML solution Take 330 mg by mouth daily.   Yes [provider]  levothyroxine (SYNTHROID, LEVOTHROID) 75 MCG tablet Take 37.5-75 mcg by mouth See admin instructions. Takes 37.5 mcg every day except 75 mcg on Wednesday   Yes [provider]  pantoprazole (PROTONIX) 40 MG tablet Take 1 tablet (40 mg total) by mouth 2 (two) times daily before a meal. 06/15/17  Yes Lemmon, Lavone Nian, PA  polyethylene glycol [facility]) packet Take 17 g by mouth daily. 05/08/17  Yes Ghimire, Henreitta Leber, MD  senna (SENOKOT) 8.6 MG TABS tablet Take 2 tablets (17.2 mg total) by mouth at bedtime. 05/08/17  Yes Ghimire, Henreitta Leber, MD  sucralfate (CARAFATE) 1 GM/10ML suspension Take 10  mLs (1 g total) by mouth 4 (four) times daily. 08/18/17  Yes Gatha Mayer, MD  tolterodine (DETROL LA) 4 MG 24 hr capsule Take 4 mg by mouth daily with breakfast.    Yes [provider]  amLODipine (NORVASC) 5 MG tablet Take 1 tablet (5 mg total) by mouth daily. Patient not taking: Reported on Oct 29, 2017 05/08/17   Hosie Poisson, MD  ferrous sulfate 300 (60 Fe) MG/5ML syrup Take 5 mLs (300 mg total) by mouth daily. Patient not taking: Reported on 29-Oct-2017 08/18/17   Gatha Mayer, MD    Physical Exam:  Constitutional: Pale elderly female who appears to be in significant distress  currently off BiPAP on room air. Vitals:   10/08/2017 2340 2017/11/05 0010 11-05-2017 0107 Nov 05, 2017 0212  BP: 93/64 (!) 72/60 (!) 77/52 (!) 70/50  Pulse: 92  75 90  Resp: (!) 27  (!) 24 (!) 23  Temp:  (!) 101.3 F (38.5 C)    TempSrc:  Rectal    SpO2: 96%  (!) 86% 96%   Eyes: PERRL, lids and conjunctivae normal ENMT: Mucous membranes are dry. posterior pharynx clear of any exudate or lesions.  Neck: Positive JVD noted no significant masses appreciated. Respiratory: Tachypneic with significant respiratory distress.  Positive rales and crackles appreciated throughout all lung fields.  Patient using accessory muscle use is to breathe. Cardiovascular: Regular rate and rhythm, no murmurs / rubs / gallops.  +2 pitting lower extremity edema. 2+ pedal pulses. No carotid bruits.  Abdomen: no tenderness, no masses palpated. No hepatosplenomegaly. Bowel sounds positive.  Musculoskeletal: no clubbing / cyanosis. No joint deformity upper and lower extremities. Good ROM, no contractures. Normal muscle tone.  Skin: Pallor noted no rashes, lesions, ulcers. No induration Neurologic: CN 2-12 grossly intact.  Patient able to move all extremities. Psychiatric: Patient appears anxious and unable to assess orientation due to distress.   Labs on Admission: I have personally reviewed following labs and imaging  studies  CBC: Recent Labs  Lab 10/26/2017 2349  WBC 6.2  NEUTROABS 5.5  HGB 8.9*  HCT 31.3*  MCV 74.2*  PLT 376   Basic Metabolic Panel: Recent Labs  Lab 10/14/2017 2349  NA 134*  K 4.5  CL 103  CO2 16*  GLUCOSE 126*  BUN 30*  CREATININE 1.36*  CALCIUM 8.9   GFR: CrCl cannot be calculated (Unknown ideal weight.). Liver Function Tests: Recent Labs  Lab 10/21/2017 2349  AST 1,590*  ALT 1,356*  ALKPHOS 99  BILITOT 1.4*  PROT 6.6  ALBUMIN 3.1*   No results for input(s): LIPASE, AMYLASE in the last 168 hours. No results for input(s): AMMONIA in the last 168 hours. Coagulation Profile: No results for input(s): INR, PROTIME in the last 168 hours. Cardiac Enzymes: No results for input(s): CKTOTAL, CKMB, CKMBINDEX, TROPONINI in the last 168 hours. BNP (last 3 results) No results for input(s): PROBNP in the last 8760 hours. HbA1C: No results for input(s): HGBA1C in the last 72 hours. CBG: No results for input(s): GLUCAP in the last 168 hours. Lipid Profile: No results for input(s): CHOL, HDL, LDLCALC, TRIG, CHOLHDL, LDLDIRECT in the last 72 hours. Thyroid Function Tests: No results for input(s): TSH, T4TOTAL, FREET4, T3FREE, THYROIDAB in the last 72 hours. Anemia Panel: No results for input(s): VITAMINB12, FOLATE, FERRITIN, TIBC, IRON, RETICCTPCT in the last 72 hours. Urine analysis:    Component Value Date/Time   COLORURINE STRAW (A) 05/07/2017 1418   APPEARANCEUR CLEAR 05/07/2017 1418   LABSPEC 1.008 05/07/2017 1418   PHURINE 7.0 05/07/2017 1418   GLUCOSEU NEGATIVE 05/07/2017 1418   GLUCOSEU NEGATIVE 02/02/2013 1500   HGBUR NEGATIVE 05/07/2017 1418   HGBUR negative 07/09/2009 1101   BILIRUBINUR NEGATIVE 05/07/2017 1418   BILIRUBINUR Neg 10/06/2012 0927   KETONESUR NEGATIVE 05/07/2017 1418   PROTEINUR NEGATIVE 05/07/2017 1418   UROBILINOGEN 0.2 02/28/2015 2038   NITRITE NEGATIVE 05/07/2017 1418   LEUKOCYTESUR NEGATIVE 05/07/2017 1418   Sepsis Labs: No  results found for this or any previous visit (from the past 240 hour(s)).   Radiological Exams on Admission: Dg Chest Port 1 View  Result Date: 2017-11-05 CLINICAL DATA:  Difficulty breathing EXAM: PORTABLE CHEST  1 VIEW COMPARISON:  08/12/2017 FINDINGS: Left-sided pacing device as before. Bilateral pleural effusions. Cardiomegaly with vascular congestion and mild to moderate pulmonary edema. Confluent airspace disease at the bases. Aortic atherosclerosis. No pneumothorax IMPRESSION: Bilateral pleural effusions. Vascular congestion with mild to moderate pulmonary edema. Confluent airspace disease at the bases may reflect atelectasis or pneumonia. Electronically Signed   By: Donavan Foil M.D.   On: October 31, 2017 00:18    EKG: Independently reviewed.  Paced rhythm  Assessment/Plan Septic shock 2/2 presumed healthcare associated pneumonia Acute respiratory failure with hypoxia Elevated troponin Congestive heart failure exacerbation Acute kidney injury  Elevated liver enzymes Complete heart block s/p pacemaker Comfort care measures only Patient presents with signs of septic shock presumed secondary to pneumonia recently diagnosed.  On initial work-up patient found to be hypotensive with signs of heart failure with pulmonary edema and multiorgan system failure.  Family recommending discontinuation of aggressive measures at this time and want to keep patient comfortable.  Discussed comfort care measures and what that entailed.  Family states patient is a DO NOT RESUSCITATE. - Admit to MedSurg bed - Comfort care order set initiated - Discontinue previously ordered labs, studies, needlesticks, and blood draws - Maintain IV access - Continue nasal cannula oxygen - Vital signs every 4 hours  - Assess for pain every 2 hours - Morphine iv as needed pain/respiratory distress - Ativan IV as needed anxiety - Scopolamine as needed for secretions - Zofran as needed for nausea/vomiting  - Pacemaker  currently left on given history of complete heart block and wanted to discuss pacemaker in a.m.   DVT prophylaxis: none  Code Status: DNR  Family Communication: Discussed plan of care with the patient and family present at bedside Disposition Plan: N/A Consults called: None Admission status: Inpatient  Norval Morton MD [facility] Pager [phone]   If 7PM-7AM, please contact night-coverage [email or web address] Password [email or web address]  10-31-17, 2:35 AM

## 2017-10-30 NOTE — ED Notes (Signed)
Spoke with e link lactic refused

## 2017-10-30 NOTE — ED Notes (Signed)
Blood culture #1 collected from right forearm

## 2017-10-30 NOTE — Progress Notes (Deleted)
Pt was seen by Dr. Tamala Julian whom completed a death summary note. In the future can you please verify well who the queries are for.  Velvet Bathe, MD

## 2017-10-30 DEATH — deceased

## 2017-11-08 ENCOUNTER — Ambulatory Visit: Payer: Medicare Other | Admitting: Podiatry

## 2018-01-01 IMAGING — RF DG UGI W/ KUB
15 of 21 series · 17 of 24 positions shown · non-contrast
Comparison: CT chest abdomen pelvis of 05/24/2014

CLINICAL DATA: Nausea, vomiting, possible reflux

EXAM:
UPPER GI SERIES WITH KUB
TECHNIQUE: After obtaining a scout radiograph a routine upper GI series was
performed using thin barium
FLUOROSCOPY TIME:  Radiation Exposure Index (as provided by the
fluoroscopic device): 64 deciGy per square cm
If the device does not provide the exposure index:
Fluoroscopy Time (in minutes and seconds):  2 minutes 6 seconds
Number of Acquired Images:

[Series 1: run · 1 of 12 slices shown (1 of 14)]
[im 1/12]
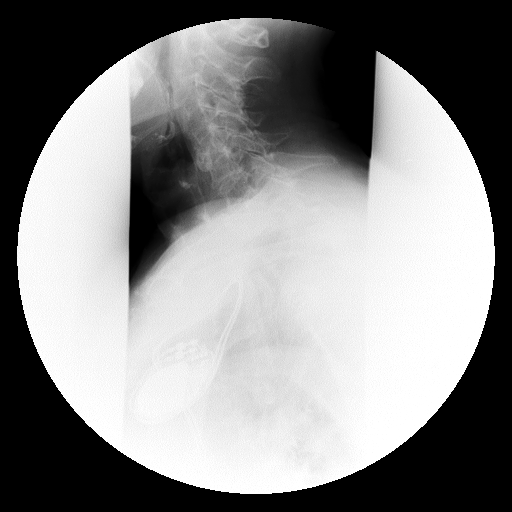

[Series 2: run · 3 of 13 slices shown (2 of 14)]
[im 1/13]
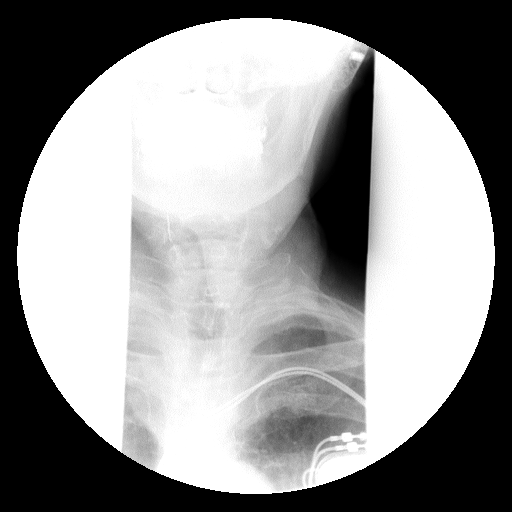
[im 7/13]
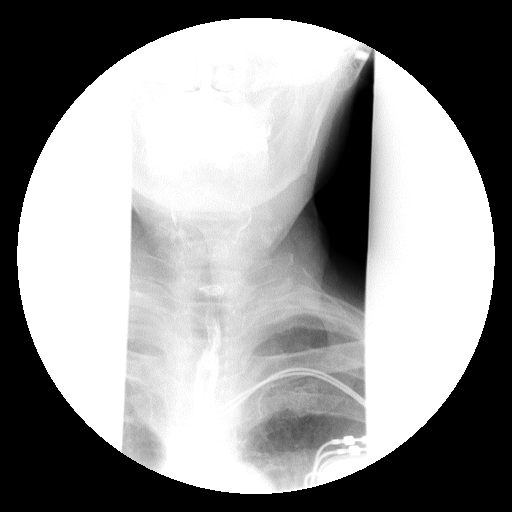
[im 13/13]
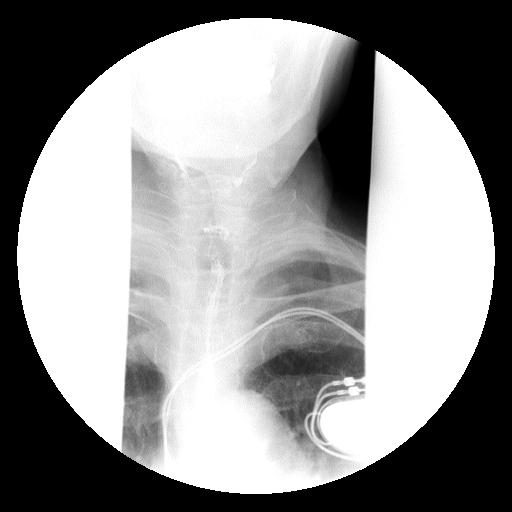

[Series 5: run · 1 of 1 slices shown (3 of 14)]
[im 1/1]
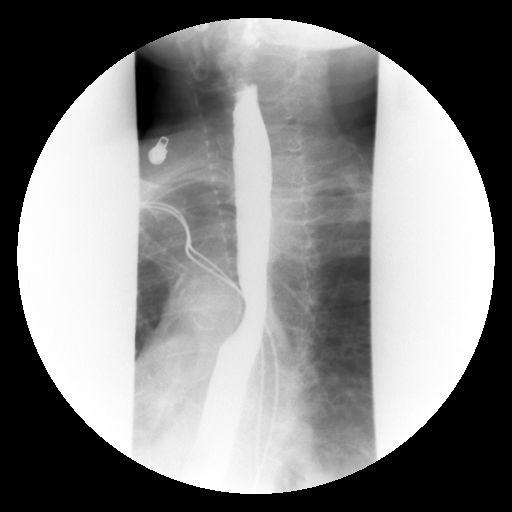

[Series 6: run · 1 of 1 slices shown (4 of 14)]
[im 1/1]
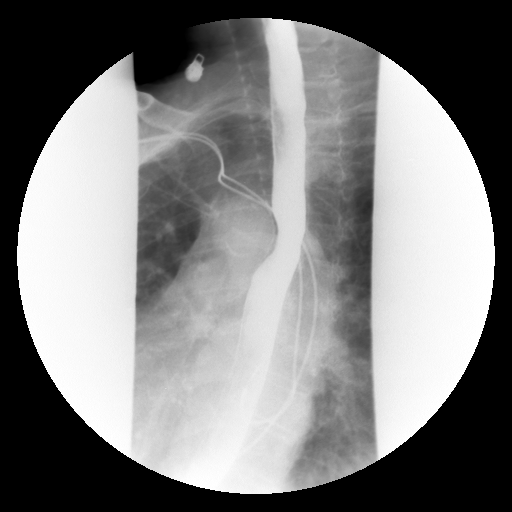

[Series 8: run · 1 of 1 slices shown (5 of 14)]
[im 1/1]
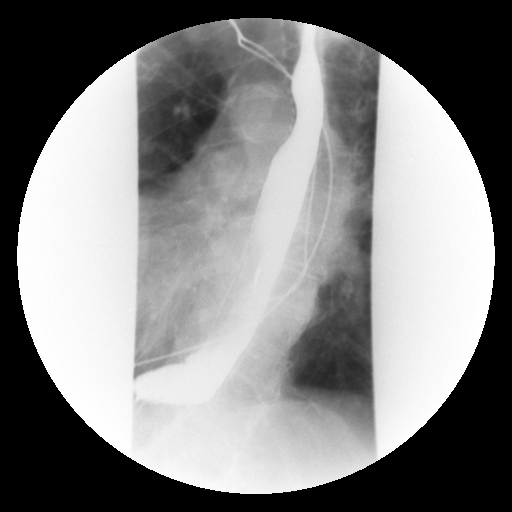

[Series 9: run · 1 of 1 slices shown (6 of 14)]
[im 1/1]
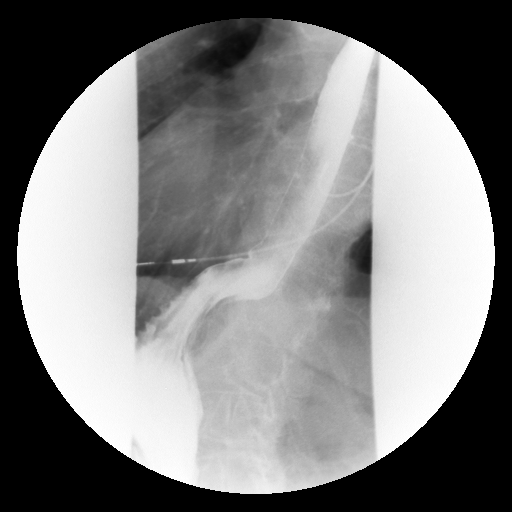

[Series 11: run · 1 of 1 slices shown (7 of 14)]
[im 1/1]
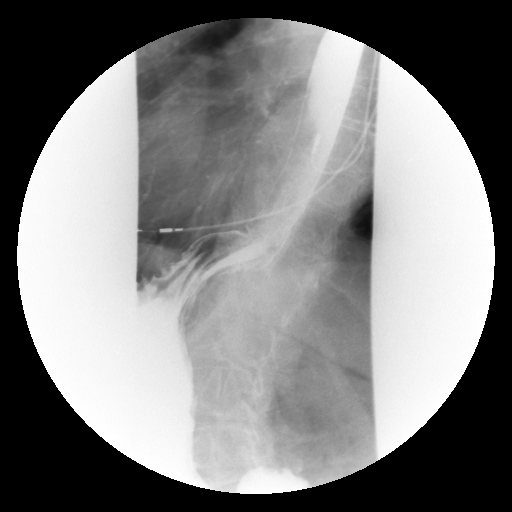

[Series 12: run · 1 of 3 slices shown (8 of 14)]
[im 1/3]
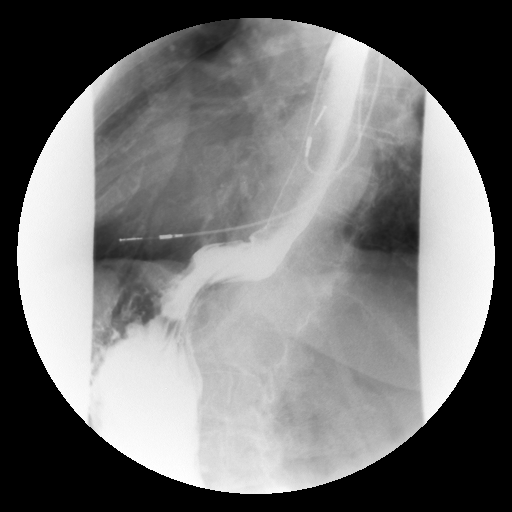

[Series 13: run · 1 of 1 slices shown (9 of 14)]
[im 1/1]
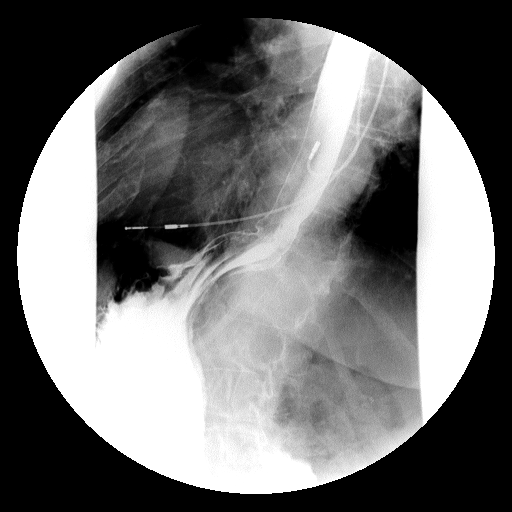

[Series 15: run · 1 of 1 slices shown (10 of 14)]
[im 1/1]
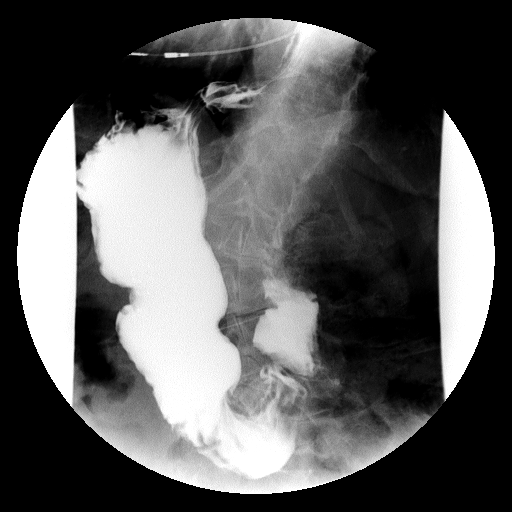

[Series 16: run · 1 of 1 slices shown (11 of 14)]
[im 1/1]
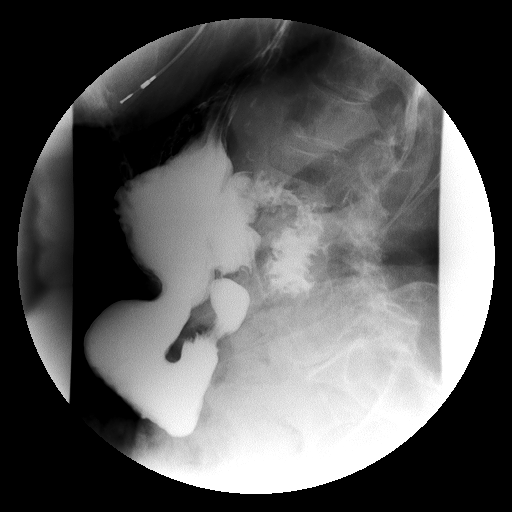

[Series 18: run · 1 of 1 slices shown (12 of 14)]
[im 1/1]
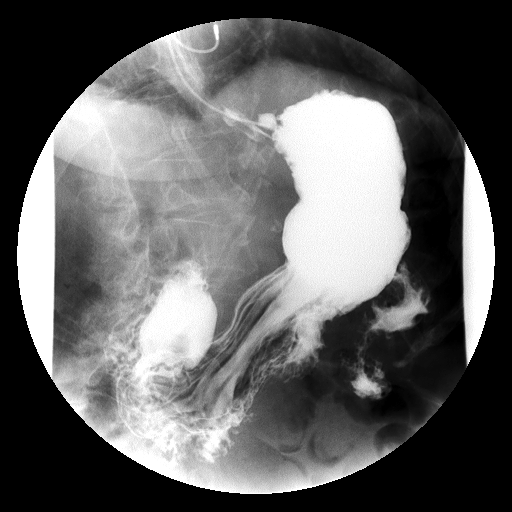

[Series 19: run · 1 of 1 slices shown (13 of 14)]
[im 1/1]
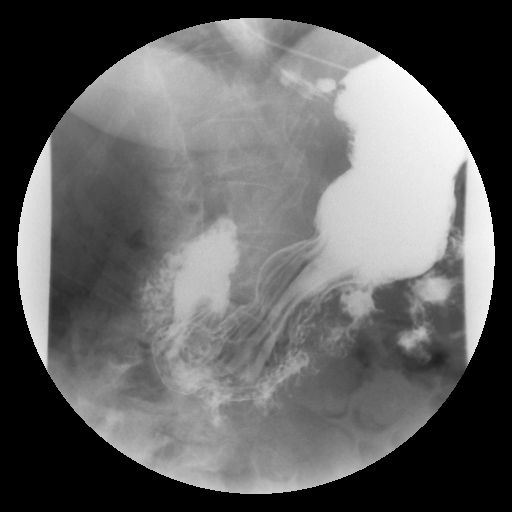

[Series 20: run · 1 of 1 slices shown (14 of 14)]
[im 1/1]
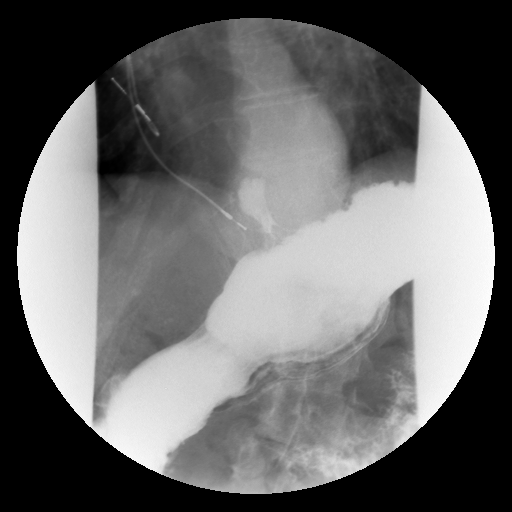

[Series 1001: view not recorded · 0.20mm/px · 1 of 1 slices shown]
[im 1/1]
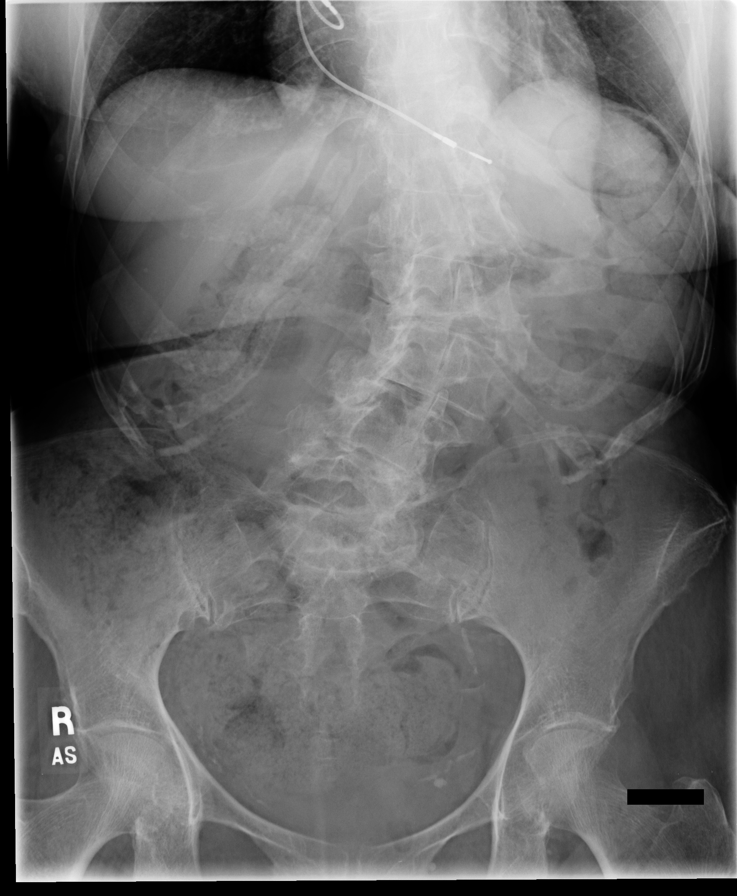

[17 of 24 positions shown; findings below may reference images not displayed]

FINDINGS: A preliminary film of the abdomen shows no bowel obstruction.
Moderately severe lumbar scoliosis convex to the left is present
with degenerative change. Permanent pacemaker wires are noted.

The study was begun in the lateral projection in this patient who
give a history of coughing after swallowing intermittently. No
penetration or aspiration of barium is seen. The swallowing
mechanism is unremarkable, with some slight prominence of the
cricopharyngeus muscle. Esophageal peristalsis is relatively normal.
There is a small hiatal hernia present and only faint
gastroesophageal reflux is documented. A barium pill was given at
the end of the study which passed into the stomach without delay.
The stomach otherwise is normal in contour with normal peristaltic
motion. The duodenal bulb fills and the duodenal loop is in normal
position.
IMPRESSION: 1. Small hiatal hernia with only faint gastroesophageal reflux. The
barium pill did pass into the stomach without delay.
2. No significant abnormality of the stomach or duodenum is seen.
3. Moderately severe lumbar scoliosis with associated degenerative
change.

## 2018-05-05 LAB — BLOOD GAS, VENOUS
Acid-base deficit: 6.9 mmol/L — ABNORMAL HIGH (ref 0.0–2.0)
BICARBONATE: 17.8 mmol/L — AB (ref 20.0–28.0)
FIO2: 40
O2 SAT: 36.5 %
Patient temperature: 98.6
pCO2, Ven: 34.6 mmHg — ABNORMAL LOW (ref 44.0–60.0)
pH, Ven: 7.331 (ref 7.250–7.430)
